# Patient Record
Sex: Female | Born: 1975 | Race: Black or African American | Hispanic: No | Smoking: Current every day smoker
Health system: Southern US, Community
[De-identification: ages and names within clinical notes are randomized; demographics above are authoritative.]

## PROBLEM LIST (undated history)

## (undated) DIAGNOSIS — F17200 Nicotine dependence, unspecified, uncomplicated: Secondary | ICD-10-CM

## (undated) DIAGNOSIS — I5022 Chronic systolic (congestive) heart failure: Secondary | ICD-10-CM

## (undated) DIAGNOSIS — B20 Human immunodeficiency virus [HIV] disease: Secondary | ICD-10-CM

## (undated) DIAGNOSIS — F319 Bipolar disorder, unspecified: Secondary | ICD-10-CM

## (undated) DIAGNOSIS — D219 Benign neoplasm of connective and other soft tissue, unspecified: Secondary | ICD-10-CM

## (undated) DIAGNOSIS — I10 Essential (primary) hypertension: Secondary | ICD-10-CM

## (undated) HISTORY — PX: OTHER SURGICAL HISTORY: SHX169

---

## 2004-12-11 DIAGNOSIS — I219 Acute myocardial infarction, unspecified: Secondary | ICD-10-CM

## 2004-12-11 DIAGNOSIS — I252 Old myocardial infarction: Secondary | ICD-10-CM

## 2004-12-11 HISTORY — DX: Acute myocardial infarction, unspecified: I21.9

## 2018-01-17 ENCOUNTER — Inpatient Hospital Stay: Admit: 2018-01-17 | Discharge: 2018-01-18 | Disposition: A | Discharge status: Home / Self Care | Payer: MEDICAID

## 2018-01-17 ENCOUNTER — Emergency Department: Admit: 2018-01-17 | Payer: MEDICAID

## 2018-01-17 DIAGNOSIS — I110 Hypertensive heart disease with heart failure: Principal | ICD-10-CM

## 2018-01-17 LAB — COMPREHENSIVE METABOLIC PANEL W/ REFLEX TO MG FOR LOW K
ALT: 9 U/L (ref 0–32)
AST: 14 U/L (ref 0–31)
Albumin: 3.5 g/dL (ref 3.5–5.2)
Alkaline Phosphatase: 39 U/L (ref 35–104)
Anion Gap: 3 mmol/L — ABNORMAL LOW (ref 7–16)
BUN: 14 mg/dL (ref 6–20)
CO2: 26 mmol/L (ref 22–29)
Calcium: 8.9 mg/dL (ref 8.6–10.2)
Chloride: 105 mmol/L (ref 98–107)
Creatinine: 1.1 mg/dL — ABNORMAL HIGH (ref 0.5–1.0)
GFR African American: >60
GFR Non-African American: >60 mL/min/{1.73_m2} (ref >=60)
Glucose: 97 mg/dL (ref 74–99)
Potassium reflex Magnesium: 4.6 mmol/L (ref 3.5–5.0)
Sodium: 134 mmol/L (ref 132–146)
Total Bilirubin: 0.3 mg/dL (ref 0.0–1.2)
Total Protein: 6.7 g/dL (ref 6.4–8.3)

## 2018-01-17 LAB — URINALYSIS
Bilirubin Urine: NEGATIVE
Glucose, Ur: NEGATIVE mg/dL
Ketones, Urine: NEGATIVE mg/dL
Nitrite, Urine: NEGATIVE
Protein, UA: 30 mg/dL — AB
Specific Gravity, UA: 1.02 (ref 1.005–1.030)
Urobilinogen, Urine: 0.2 E.U./dL (ref <2.0)
pH, UA: 7 (ref 5.0–9.0)

## 2018-01-17 LAB — POC PREGNANCY UR-QUAL
HCG, Urine, POC: NEGATIVE
Lot Number: 8080114

## 2018-01-17 LAB — MICROSCOPIC URINALYSIS

## 2018-01-17 LAB — TROPONIN: Troponin: <0.01 ng/mL (ref 0.00–0.03)

## 2018-01-17 NOTE — ED Notes (Signed)
FIRST PROVIDER CONTACT ASSESSMENT NOTE     Department of Emergency Medicine   01/17/18  4:15 PM    Chief Complaint: Hypertension (sent in from [facility] pt c/o HA 8/10)      History of Present Illness:   Julia Andrews is a 42 y.o. female who presents to the ED for hypertension and a headache. Patient has a history of CVA. Patient also states that she is currently seeking treatment any for cocaine use.    Medical History:  has no past medical history on file.  Surgical History:  has no past surgical history on file.  Social History:    Family History: family history is not on file.    *ALLERGIES*     Lisinopril; Remeron [mirtazapine]; and Abilify [aripiprazole]     Physical Exam:      VS:  BP (!) 151/105   Pulse 79   Temp 97.8 F (36.6 C)   Resp 16   Ht 5\' 4"  (1.626 m)   Wt 130 lb (59 kg)   SpO2 100%   BMI 22.31 kg/m      Initial Plan of Care:  Initiate Treatment-Testing, Proceed toTreatment Area When Bed Available for ED Attending/MLP to Continue Care    -----------------END OF FIRST PROVIDER CONTACT ASSESSMENT NOTE--------------  Electronically signed by Melody ComasMaggie K Conway, APRN - CNP   DD: 01/17/18           Melody ComasMaggie K Conway, APRN - CNP  01/17/18 1616

## 2018-01-17 NOTE — ED Provider Notes (Signed)
[facility]      Department of Emergency Medicine   ED  Provider Note  Admit Date/RoomTime: 01/17/2018  4:45 PM  ED Room: 46/INTW-1  MRN: [id number]  Chief Complaint: Hypertension (sent in from [facility] pt c/o HA 8/10)       History of Present Illness   Source of history provided by:  patient.  History/Exam Limitations: none.       Julia Andrews is a 42 y.o. female who has a past medical history of:   Past Medical History:   Diagnosis Date   . CHF (congestive heart failure) (HCC)    . HIV (human immunodeficiency virus infection) (HCC)    . Hypertension     presents to the ED by private car and is alone for high blood pressure reading at [facility]. She reports that she does have a long history of high blood pressure and has not been taking her medications. She denies any headache, denies any lightheadedness or dizziness, denies any denies any blurred vision. Denies any chest pain shortness of breath or difficulty breathing. Denies any abdominal pain, nausea, vomiting, diarrhea.,    ROS    Pertinent positives and negatives are stated within HPI, all other systems reviewed and are negative.    History reviewed. No pertinent surgical history.Social History:  reports that she has been smoking cigarettes.  She has been smoking about 0.25 packs per day. She has never used smokeless tobacco. She reports that she has current or past drug history. Drug: Cocaine. She reports that she does not drink alcohol.  Family History: family history is not on file.  Allergies: Lisinopril; Remeron [mirtazapine]; and Abilify [aripiprazole]    Physical Exam   Oxygen Saturation Interpretation: Normal.   ED Triage Vitals   BP Temp Temp Source Pulse Resp SpO2 Height Weight   01/17/18 1611 01/17/18 1611 01/17/18 2103 01/17/18 1611 01/17/18 1611 01/17/18 1611 01/17/18 1611 01/17/18 1611   (!) 151/105 97.8 F (36.6 C) Oral 79 16 100 % 5\' 4"  (1.626 m) 130 lb (59 kg)       Physical Exam   Constitutional/General: Alert and oriented x3,  well appearing, non toxic   HEENT:  NC/NT. PERRLA,  Airway patent.   Neck: Supple, full ROM, non tender to palpation in the midline, no stridor, no crepitus, no meningeal signs   Respiratory: Lungs clear to auscultation bilaterally, no wheezes, rales, or rhonchi. Not in respiratory distress   CV:  Regular rate. Regular rhythm. No murmurs, gallops, or rubs. 2+ distal pulses   Chest: No chest wall tenderness   GI:  Abdomen Soft, Non tender, Non distended.  +BS.   No rebound, guarding, or rigidity. No pulsatile masses.   Musculoskeletal: Moves all extremities x 4. Warm and well perfused, no clubbing, cyanosis, or edema. Capillary refill <3 seconds   Integument: skin warm and dry. No rashes.    Lymphatic: no lymphadenopathy noted   Neurologic: GCS 15, no focal deficits, symmetric strength 5/5 in the upper and lower extremities bilaterally   Psychiatric: Normal Affect    Lab / Imaging Results   (All laboratory and radiology results have been personally reviewed by myself)  Labs:  Results for orders placed or performed during the hospital encounter of 01/17/18   CBC Auto Differential   Result Value Ref Range    WBC 2.4 (L) 4.5 - 11.5 E9/L    RBC 3.62 3.50 - 5.50 E12/L    Hemoglobin 9.8 (L) 11.5 - 15.5 g/dL  Hematocrit 32.1 (L) 34.0 - 48.0 %    MCV 88.7 80.0 - 99.9 fL    MCH 27.1 26.0 - 35.0 pg    MCHC 30.5 (L) 32.0 - 34.5 %    RDW 14.6 11.5 - 15.0 fL    Platelets 219 130 - 450 E9/L    MPV 10.3 7.0 - 12.0 fL    Neutrophils % 45.1 43.0 - 80.0 %    Lymphocytes % 46.0 (H) 20.0 - 42.0 %    Monocytes % 8.0 2.0 - 12.0 %    Eosinophils % 0.9 0.0 - 6.0 %    Basophils % 0.8 0.0 - 2.0 %    Neutrophils # 1.08 (L) 1.80 - 7.30 E9/L    Lymphocytes # 1.10 (L) 1.50 - 4.00 E9/L    Monocytes # 0.19 0.10 - 0.95 E9/L    Eosinophils # 0.02 (L) 0.05 - 0.50 E9/L    Basophils # 0.00 0.00 - 0.20 E9/L    Anisocytosis 1+     Polychromasia 1+     Hypochromia 1+     Poikilocytes 1+     Ovalocytes 1+    Comprehensive Metabolic Panel w/  Reflex to MG   Result Value Ref Range    Sodium 134 132 - 146 mmol/L    Potassium reflex Magnesium 4.6 3.5 - 5.0 mmol/L    Chloride 105 98 - 107 mmol/L    CO2 26 22 - 29 mmol/L    Anion Gap 3 (L) 7 - 16 mmol/L    Glucose 97 74 - 99 mg/dL    BUN 14 6 - 20 mg/dL    CREATININE 1.1 (H) 0.5 - 1.0 mg/dL    GFR Non-African American >60 >=60 mL/min/1.73    GFR African American >60     Calcium 8.9 8.6 - 10.2 mg/dL    Total Protein 6.7 6.4 - 8.3 g/dL    Alb 3.5 3.5 - 5.2 g/dL    Total Bilirubin 0.3 0.0 - 1.2 mg/dL    Alkaline Phosphatase 39 35 - 104 U/L    ALT 9 0 - 32 U/L    AST 14 0 - 31 U/L   Troponin   Result Value Ref Range    Troponin <0.01 0.00 - 0.03 ng/mL   Urinalysis, reflex to microscopic   Result Value Ref Range    Color, UA RED (A) Straw/Yellow    Clarity, UA SL CLOUDY Clear    Glucose, Ur Negative Negative mg/dL    Bilirubin Urine Negative Negative    Ketones, Urine Negative Negative mg/dL    Specific Gravity, UA 1.020 1.005 - 1.030    Blood, Urine LARGE (A) Negative    pH, UA 7.0 5.0 - 9.0    Protein, UA 30 (A) Negative mg/dL    Urobilinogen, Urine 0.2 <2.0 E.U./dL    Nitrite, Urine Negative Negative    Leukocyte Esterase, Urine TRACE (A) Negative   Microscopic Urinalysis   Result Value Ref Range    WBC, UA 1-3 0 - 5 /HPF    RBC, UA 10-20 (A) 0 - 2 /HPF    Bacteria, UA RARE (A) /HPF   POC Pregnancy Urine   Result Value Ref Range    HCG, Urine, POC Negative Negative    Lot Number [id number]     Positive QC Pass/Fail Pass     Negative QC Pass/Fail Pass      Imaging:  All Radiology results interpreted by Radiologist unless otherwise noted.  CT Head  WO Contrast   Final Result      No acute findings.         XR CHEST STANDARD (2 VW)   Final Result   ALERT:  THIS IS AN ABNORMAL REPORT    1. Enlarged cardiomediastinal silhouette without the benefit of   comparison studies, the possibility of underlying pericardial effusion   or other cardiac abnormalities are not excluded on the basis of this   exam, clinical correlation  recommended.   2. Underlying mild pulmonary edema.   3. Indeterminate bibasilar opacifications are likely reflective of   atelectasis versus early developing pneumonia.   4. Suspected skinfold overlying the right mid and lower lung, if there   is clinical concern for potential pneumothorax, a repeat chest   radiograph with careful attention by the x-ray technician to this   region to exclude any potential of skin fold would be recommended.          ED Course / Medical Decision Making     Medications   furosemide (LASIX) tablet 20 mg (20 mg Oral Given 01/17/18 1920)   cloNIDine (CATAPRES) tablet 0.1 mg (0.1 mg Oral Given 01/17/18 1920)            Consult(s):   None    Procedure(s):   none    MDM:   Patient's blood pressure was able to be controlled in the emergency department she'll be discharged instructed to follow up by taking her prescribed medications, she was found to have mild congestive heart failure on imaging however she's having no shortness breath difficulty breathing and and only trace edema. She E expresses to me chronic vaginal bleeding throughout the month, her blood counts and her vital signs were normal in the emergency department I will defer to follow-up with GYN this week    Counseling:    The emergency provider has spoken with the patient and discussed today's results, in addition to providing specific details for the plan of care and counseling regarding the diagnosis and prognosis.  Questions are answered at this time and they are agreeable with the plan.    Assessment      1. Hypertension, unspecified type    2. Vagina bleeding    3. Congestive heart failure, unspecified HF chronicity, unspecified heart failure type (HCC)      home  Discharge to home  Patient condition is good    New Medications   There are no discharge medications for this patient.    Electronically signed by Eunice Blase, APRN - CNP   DD: 03/05/18  **This report was transcribed using voice recognition software. Every effort was  made to ensure accuracy; however, inadvertent computerized transcription errors may be present.  END OF ED PROVIDER NOTE        Eunice Blase, APRN - CNP  03/05/18 0007

## 2018-01-18 LAB — CBC WITH AUTO DIFFERENTIAL
Basophils %: 0.8 % (ref 0.0–2.0)
Basophils Absolute: 0 E9/L (ref 0.00–0.20)
Eosinophils %: 0.9 % (ref 0.0–6.0)
Eosinophils Absolute: 0.02 E9/L — ABNORMAL LOW (ref 0.05–0.50)
Hematocrit: 32.1 % — ABNORMAL LOW (ref 34.0–48.0)
Hemoglobin: 9.8 g/dL — ABNORMAL LOW (ref 11.5–15.5)
Lymphocytes %: 46 % — ABNORMAL HIGH (ref 20.0–42.0)
Lymphocytes Absolute: 1.1 E9/L — ABNORMAL LOW (ref 1.50–4.00)
MCH: 27.1 pg (ref 26.0–35.0)
MCHC: 30.5 % — ABNORMAL LOW (ref 32.0–34.5)
MCV: 88.7 fL (ref 80.0–99.9)
MPV: 10.3 fL (ref 7.0–12.0)
Monocytes %: 8 % (ref 2.0–12.0)
Monocytes Absolute: 0.19 E9/L (ref 0.10–0.95)
Neutrophils %: 45.1 % (ref 43.0–80.0)
Neutrophils Absolute: 1.08 E9/L — ABNORMAL LOW (ref 1.80–7.30)
Platelets: 219 E9/L (ref 130–450)
RBC: 3.62 E12/L (ref 3.50–5.50)
RDW: 14.6 fL (ref 11.5–15.0)
WBC: 2.4 E9/L — ABNORMAL LOW (ref 4.5–11.5)

## 2018-01-18 MED ORDER — CLONIDINE HCL 0.1 MG PO TABS
0.1 MG | Freq: Once | ORAL | Status: AC
Start: 2018-01-18 — End: 2018-01-17
  Administered 2018-01-18: 0.1 mg via ORAL

## 2018-01-18 MED ORDER — KETOROLAC TROMETHAMINE 30 MG/ML IJ SOLN
30 | Freq: Four times a day (QID) | INTRAMUSCULAR | Status: DC | PRN
Start: 2018-01-18 — End: 2018-01-17
  Administered 2018-01-18: 15 mg via INTRAVENOUS

## 2018-01-18 MED ORDER — FUROSEMIDE 20 MG PO TABS
20 MG | Freq: Once | ORAL | Status: AC
Start: 2018-01-18 — End: 2018-01-17
  Administered 2018-01-18: 20 mg via ORAL

## 2018-01-18 MED FILL — CLONIDINE HCL 0.1 MG PO TABS: 0.1 mg | ORAL | Qty: 1

## 2018-01-18 MED FILL — KETOROLAC TROMETHAMINE 30 MG/ML IJ SOLN: 30 mg/mL | INTRAMUSCULAR | Qty: 1

## 2018-01-18 MED FILL — FUROSEMIDE 20 MG PO TABS: 20 mg | ORAL | Qty: 1

## 2020-03-15 ENCOUNTER — Other Ambulatory Visit: Payer: Self-pay

## 2020-03-15 ENCOUNTER — Observation Stay (HOSPITAL_COMMUNITY)
Admission: EM | Admit: 2020-03-15 | Discharge: 2020-03-17 | Disposition: A | Discharge status: Home / Self Care | Payer: Medicaid - Out of State | Attending: Internal Medicine | Admitting: Internal Medicine

## 2020-03-15 ENCOUNTER — Observation Stay (HOSPITAL_BASED_OUTPATIENT_CLINIC_OR_DEPARTMENT_OTHER): Payer: Medicaid - Out of State

## 2020-03-15 ENCOUNTER — Emergency Department (HOSPITAL_COMMUNITY): Payer: Medicaid - Out of State

## 2020-03-15 ENCOUNTER — Encounter (HOSPITAL_COMMUNITY): Payer: Self-pay | Admitting: Emergency Medicine

## 2020-03-15 DIAGNOSIS — I5022 Chronic systolic (congestive) heart failure: Secondary | ICD-10-CM | POA: Diagnosis present

## 2020-03-15 DIAGNOSIS — B20 Human immunodeficiency virus [HIV] disease: Secondary | ICD-10-CM

## 2020-03-15 DIAGNOSIS — Z79899 Other long term (current) drug therapy: Secondary | ICD-10-CM | POA: Diagnosis not present

## 2020-03-15 DIAGNOSIS — F319 Bipolar disorder, unspecified: Secondary | ICD-10-CM

## 2020-03-15 DIAGNOSIS — R0789 Other chest pain: Principal | ICD-10-CM | POA: Insufficient documentation

## 2020-03-15 DIAGNOSIS — Z21 Asymptomatic human immunodeficiency virus [HIV] infection status: Secondary | ICD-10-CM | POA: Diagnosis not present

## 2020-03-15 DIAGNOSIS — Z915 Personal history of self-harm: Secondary | ICD-10-CM | POA: Diagnosis not present

## 2020-03-15 DIAGNOSIS — R079 Chest pain, unspecified: Secondary | ICD-10-CM | POA: Diagnosis present

## 2020-03-15 DIAGNOSIS — I110 Hypertensive heart disease with heart failure: Secondary | ICD-10-CM | POA: Insufficient documentation

## 2020-03-15 DIAGNOSIS — F3111 Bipolar disorder, current episode manic without psychotic features, mild: Secondary | ICD-10-CM

## 2020-03-15 DIAGNOSIS — I169 Hypertensive crisis, unspecified: Secondary | ICD-10-CM | POA: Diagnosis present

## 2020-03-15 DIAGNOSIS — F17210 Nicotine dependence, cigarettes, uncomplicated: Secondary | ICD-10-CM | POA: Insufficient documentation

## 2020-03-15 DIAGNOSIS — I2510 Atherosclerotic heart disease of native coronary artery without angina pectoris: Secondary | ICD-10-CM | POA: Diagnosis not present

## 2020-03-15 DIAGNOSIS — R072 Precordial pain: Secondary | ICD-10-CM

## 2020-03-15 DIAGNOSIS — Z87892 Personal history of anaphylaxis: Secondary | ICD-10-CM | POA: Diagnosis not present

## 2020-03-15 DIAGNOSIS — F1594 Other stimulant use, unspecified with stimulant-induced mood disorder: Secondary | ICD-10-CM

## 2020-03-15 DIAGNOSIS — Z9114 Patient's other noncompliance with medication regimen: Secondary | ICD-10-CM | POA: Diagnosis not present

## 2020-03-15 DIAGNOSIS — Z20822 Contact with and (suspected) exposure to covid-19: Secondary | ICD-10-CM | POA: Diagnosis not present

## 2020-03-15 DIAGNOSIS — D219 Benign neoplasm of connective and other soft tissue, unspecified: Secondary | ICD-10-CM | POA: Diagnosis present

## 2020-03-15 DIAGNOSIS — F419 Anxiety disorder, unspecified: Secondary | ICD-10-CM | POA: Diagnosis not present

## 2020-03-15 DIAGNOSIS — D61818 Other pancytopenia: Secondary | ICD-10-CM | POA: Diagnosis present

## 2020-03-15 DIAGNOSIS — Z590 Homelessness: Secondary | ICD-10-CM | POA: Insufficient documentation

## 2020-03-15 DIAGNOSIS — Z888 Allergy status to other drugs, medicaments and biological substances status: Secondary | ICD-10-CM | POA: Diagnosis not present

## 2020-03-15 DIAGNOSIS — F1990 Other psychoactive substance use, unspecified, uncomplicated: Secondary | ICD-10-CM

## 2020-03-15 DIAGNOSIS — F1910 Other psychoactive substance abuse, uncomplicated: Secondary | ICD-10-CM | POA: Diagnosis not present

## 2020-03-15 DIAGNOSIS — F17200 Nicotine dependence, unspecified, uncomplicated: Secondary | ICD-10-CM | POA: Diagnosis present

## 2020-03-15 DIAGNOSIS — R778 Other specified abnormalities of plasma proteins: Secondary | ICD-10-CM

## 2020-03-15 DIAGNOSIS — I252 Old myocardial infarction: Secondary | ICD-10-CM | POA: Diagnosis not present

## 2020-03-15 DIAGNOSIS — I10 Essential (primary) hypertension: Secondary | ICD-10-CM

## 2020-03-15 DIAGNOSIS — Z9119 Patient's noncompliance with other medical treatment and regimen: Secondary | ICD-10-CM | POA: Insufficient documentation

## 2020-03-15 DIAGNOSIS — R7989 Other specified abnormal findings of blood chemistry: Secondary | ICD-10-CM

## 2020-03-15 HISTORY — DX: Nicotine dependence, unspecified, uncomplicated: F17.200

## 2020-03-15 HISTORY — DX: Benign neoplasm of connective and other soft tissue, unspecified: D21.9

## 2020-03-15 HISTORY — DX: Bipolar disorder, unspecified: F31.9

## 2020-03-15 HISTORY — DX: Human immunodeficiency virus (HIV) disease: B20

## 2020-03-15 HISTORY — DX: Chronic systolic (congestive) heart failure: I50.22

## 2020-03-15 HISTORY — DX: Essential (primary) hypertension: I10

## 2020-03-15 LAB — COMPREHENSIVE METABOLIC PANEL
ALT: 24 U/L (ref 0–44)
AST: 42 U/L — ABNORMAL HIGH (ref 15–41)
Albumin: 3.8 g/dL (ref 3.5–5.0)
Alkaline Phosphatase: 44 U/L (ref 38–126)
Anion gap: 14 (ref 5–15)
BUN: 19 mg/dL (ref 6–20)
CO2: 21 mmol/L — ABNORMAL LOW (ref 22–32)
Calcium: 9.7 mg/dL (ref 8.9–10.3)
Chloride: 104 mmol/L (ref 98–111)
Creatinine, Ser: 1.03 mg/dL — ABNORMAL HIGH (ref 0.44–1.00)
GFR calc Af Amer: >60 mL/min (ref >60)
GFR calc non Af Amer: >60 mL/min (ref >60)
Glucose, Bld: 102 mg/dL — ABNORMAL HIGH (ref 70–99)
Potassium: 4.1 mmol/L (ref 3.5–5.1)
Sodium: 139 mmol/L (ref 135–145)
Total Bilirubin: 0.5 mg/dL (ref 0.3–1.2)
Total Protein: 7.6 g/dL (ref 6.5–8.1)

## 2020-03-15 LAB — RAPID URINE DRUG SCREEN, HOSP PERFORMED
Amphetamines: POSITIVE — AB
Barbiturates: NOT DETECTED
Benzodiazepines: NOT DETECTED
Cocaine: NOT DETECTED
Opiates: NOT DETECTED
Tetrahydrocannabinol: POSITIVE — AB

## 2020-03-15 LAB — CBC WITH DIFFERENTIAL/PLATELET
Abs Immature Granulocytes: 0.01 10*3/uL (ref 0.00–0.07)
Basophils Absolute: 0 10*3/uL (ref 0.0–0.1)
Basophils Relative: 0 %
Eosinophils Absolute: 0 10*3/uL (ref 0.0–0.5)
Eosinophils Relative: 1 %
HCT: 35.8 % — ABNORMAL LOW (ref 36.0–46.0)
Hemoglobin: 11.3 g/dL — ABNORMAL LOW (ref 12.0–15.0)
Immature Granulocytes: 0 %
Lymphocytes Relative: 16 %
Lymphs Abs: 0.5 10*3/uL — ABNORMAL LOW (ref 0.7–4.0)
MCH: 28.4 pg (ref 26.0–34.0)
MCHC: 31.6 g/dL (ref 30.0–36.0)
MCV: 89.9 fL (ref 80.0–100.0)
Monocytes Absolute: 0.4 10*3/uL (ref 0.1–1.0)
Monocytes Relative: 13 %
Neutro Abs: 2.1 10*3/uL (ref 1.7–7.7)
Neutrophils Relative %: 70 %
Platelets: 138 10*3/uL — ABNORMAL LOW (ref 150–400)
RBC: 3.98 MIL/uL (ref 3.87–5.11)
RDW: 15.3 % (ref 11.5–15.5)
WBC: 3.1 10*3/uL — ABNORMAL LOW (ref 4.0–10.5)
nRBC: 0 % (ref 0.0–0.2)

## 2020-03-15 LAB — T-HELPER CELLS (CD4) COUNT (NOT AT ARMC)
CD4 % Helper T Cell: 13 % — ABNORMAL LOW (ref 33–65)
CD4 T Cell Abs: 41 /uL — ABNORMAL LOW (ref 400–1790)

## 2020-03-15 LAB — LIPID PANEL
Cholesterol: 169 mg/dL (ref 0–200)
HDL: 46 mg/dL (ref >40)
LDL Cholesterol: 114 mg/dL — ABNORMAL HIGH (ref 0–99)
Total CHOL/HDL Ratio: 3.7 RATIO
Triglycerides: 47 mg/dL (ref <150)
VLDL: 9 mg/dL (ref 0–40)

## 2020-03-15 LAB — ECHOCARDIOGRAM COMPLETE
Height: 64 in
Weight: 2080 oz

## 2020-03-15 LAB — TSH: TSH: 0.921 u[IU]/mL (ref 0.350–4.500)

## 2020-03-15 LAB — TROPONIN I (HIGH SENSITIVITY)
Troponin I (High Sensitivity): 51 ng/L — ABNORMAL HIGH (ref <18)
Troponin I (High Sensitivity): 34 ng/L — ABNORMAL HIGH (ref <18)
Troponin I (High Sensitivity): 30 ng/L — ABNORMAL HIGH (ref <18)

## 2020-03-15 LAB — ETHANOL: Alcohol, Ethyl (B): <10 mg/dL (ref <10)

## 2020-03-15 LAB — SARS CORONAVIRUS 2 (TAT 6-24 HRS): SARS Coronavirus 2: NEGATIVE

## 2020-03-15 MED ORDER — ONDANSETRON HCL 4 MG PO TABS: 4.0000 mg | ORAL_TABLET | Freq: Four times a day (QID) | ORAL | Status: DC | PRN

## 2020-03-15 MED ORDER — AMLODIPINE BESYLATE 5 MG PO TABS
5.0000 mg | ORAL_TABLET | Freq: Every day | ORAL | Status: DC
  Administered 2020-03-15 – 2020-03-16 (×2): 5 mg via ORAL
  Filled 2020-03-15 (×2): qty 1

## 2020-03-15 MED ORDER — ACETAMINOPHEN 650 MG RE SUPP: 650.0000 mg | Freq: Four times a day (QID) | RECTAL | Status: DC | PRN

## 2020-03-15 MED ORDER — ASPIRIN 81 MG PO CHEW
324.0000 mg | CHEWABLE_TABLET | Freq: Once | ORAL | Status: AC
  Administered 2020-03-15: 06:00:00 324 mg via ORAL
  Filled 2020-03-15: qty 4

## 2020-03-15 MED ORDER — ZOLPIDEM TARTRATE 5 MG PO TABS: 5.0000 mg | ORAL_TABLET | Freq: Every evening | ORAL | Status: DC | PRN

## 2020-03-15 MED ORDER — ACETAMINOPHEN 325 MG PO TABS: 650.0000 mg | ORAL_TABLET | Freq: Four times a day (QID) | ORAL | Status: DC | PRN

## 2020-03-15 MED ORDER — ENOXAPARIN SODIUM 40 MG/0.4ML ~~LOC~~ SOLN
40.0000 mg | SUBCUTANEOUS | Status: DC
  Administered 2020-03-15 – 2020-03-16 (×2): 40 mg via SUBCUTANEOUS
  Filled 2020-03-15 (×2): qty 0.4

## 2020-03-15 MED ORDER — POLYETHYLENE GLYCOL 3350 17 G PO PACK
17.0000 g | PACK | Freq: Every day | ORAL | Status: DC | PRN
  Administered 2020-03-15: 15:00:00 17 g via ORAL
  Filled 2020-03-15: qty 1

## 2020-03-15 MED ORDER — ASPIRIN EC 81 MG PO TBEC
81.0000 mg | DELAYED_RELEASE_TABLET | Freq: Every day | ORAL | Status: DC
  Administered 2020-03-16 – 2020-03-17 (×2): 81 mg via ORAL
  Filled 2020-03-15 (×2): qty 1

## 2020-03-15 MED ORDER — HYDRALAZINE HCL 20 MG/ML IJ SOLN
5.0000 mg | INTRAMUSCULAR | Status: DC | PRN
  Administered 2020-03-16 (×2): 5 mg via INTRAVENOUS
  Filled 2020-03-15: qty 1

## 2020-03-15 MED ORDER — NICOTINE 14 MG/24HR TD PT24
14.0000 mg | MEDICATED_PATCH | Freq: Every day | TRANSDERMAL | Status: DC
  Filled 2020-03-15 (×2): qty 1

## 2020-03-15 MED ORDER — ONDANSETRON HCL 4 MG/2ML IJ SOLN: 4.0000 mg | Freq: Four times a day (QID) | INTRAMUSCULAR | Status: DC | PRN

## 2020-03-15 MED ORDER — DOCUSATE SODIUM 100 MG PO CAPS
100.0000 mg | ORAL_CAPSULE | Freq: Two times a day (BID) | ORAL | Status: DC
  Administered 2020-03-15 – 2020-03-17 (×5): 100 mg via ORAL
  Filled 2020-03-15 (×6): qty 1

## 2020-03-15 MED ORDER — BISACODYL 5 MG PO TBEC: 5.0000 mg | DELAYED_RELEASE_TABLET | Freq: Every day | ORAL | Status: DC | PRN

## 2020-03-15 NOTE — Consult Note (Addendum)
Cardiology Consultation:   Patient ID: Quinlan Boom; UB:[id number]; 11/18/1976   Admit date: 03/15/2020 Date of Consult: 03/15/2020  Primary Care Provider: Patient, No Pcp Per Primary Cardiologist: New to [facility]   Patient Profile:   Melyssa Tetreault is a 44 y.o. 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 Lorin Mercy.  History of Present Illness:   Ms. Kovacevich is a 44yo F with a hx as stated above who presented to [facility] 03/15/20 with chest pain. Pt has a complicated history including homelessness and mental instability who reports that she is from [facility] and lost her husband last year. Since that time she [place] for the better. She has a complicated hx of depression with muliple suicide attempts, drug abuse with hx of incarceration and mental disorders making it difficult for her. She was previously staying at a homeless camp in [facility] when she decided to up and leave. She left all of her belongings at the camp. She made it several cities over and received help from a Engineer, structural who bought her train ticket to [facility]. She reports that after her arrival, she began having a severe headache. She was having difficulty finding the hospital. After 3 hours of walking she began having chest pain. She denies SOB, diaphoresis or nausea. She does report some pain radiation to her left upper shoulder. She has never experienced this pain in the past. She states that her MI was back in 2006 after using cocaine. 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 She was previously followed by a family practitioner however per notes and patient report, she has refused most therapies. She has trouble affording her medications and also wishes to  eventually become pregnant and has been counseled on the side effects of ACEI, ARB therapies therefore has denied. She denies recent drug use when directly asked today. Reports the only medications that she has been taking are her Lasix and Seroquel but unfortunately she left these in [facility].   On EMS arrival, she was given ASAx4 per EMS as well as 1 SL NTG with pain reduction. BP on their evaluation was elevated at 185/119. She is unable to provide specific information regarding her prior MI, HF or PTA medications.   In the ED, HST found to be 51 with repeat of 31. EKG with NSR, HR 81bpm with evidence of anterior wall MI, LVH and TWI in lateral leads. There is no EKG available for comparison and no acute ST segment changes. CXR unremarkable. COVID test in process. UDS positive for amphetamines and THC.    Cardiology has been asked to evaluate for chest pain.   Per Care Everywhere she has been followed by a family MD, last seen . She had an echocardiogram performed 01/31/2013 with an EF at 40-45% felt to be secondary to crack/cocaine abuse. Underwent exercise nuclear stress test secondary to CP 01/2013 which was found to be negative with no evidence of ischemia or prior MI.   She was prescribed hydralazine 25mg  daily as she has had compliance issues in the past and this was the only medication that she agreed to take. She is allergic to lisinopril and cannot be prescribed BB given her significant hx of  cocaine use. She was not agreeable to taking HCTZ. Her PCP prescribed nifedipine 60mg  PO QD. She is currently not taking any of these medications.   Past Medical History:  Diagnosis Date   Bipolar affective ([place])    Chronic systolic CHF (congestive heart failure) (HCC)    HIV (human immunodeficiency virus infection) ([place])    Hypertension    Myocardial infarct ([place]) 2006    Past Surgical History:  Procedure Laterality Date   none       Prior to Admission medications   Medication Sig Start Date End  Date Taking? Authorizing Provider  bictegravir-emtricitabine-tenofovir AF (BIKTARVY) 50-200-25 MG TABS tablet Take 1 tablet by mouth daily. 02/25/19   [provider]  furosemide (LASIX) 40 MG tablet Take 40 mg by mouth daily. 02/23/20   [provider]  QUEtiapine (SEROQUEL) 25 MG tablet Take 25 mg by mouth 2 (two) times daily. 11/24/19   [provider]    Inpatient Medications: Scheduled Meds:  Continuous Infusions:  PRN Meds:   Allergies:    Allergies  Allergen Reactions   Lisinopril Anaphylaxis   Remeron [Mirtazapine] Shortness Of Breath   Abilify [Aripiprazole] Rash    Social History:   Social History   Socioeconomic History   Marital status: Single    Spouse name: Not on file   Number of children: Not on file   Years of education: Not on file   Highest education level: Not on file  Occupational History   Occupation: unemployed  Tobacco Use   Smoking status: Current Every Day Smoker    Packs/day: 0.50    Types: Cigarettes   Smokeless tobacco: Never Used  Substance and Sexual Activity   Alcohol use: Not Currently   Drug use: Yes    Frequency: 1.0 times per week    Types: Marijuana, Amphetamines   Sexual activity: Not Currently  Other Topics Concern   Not on file  Social History Narrative   Not on file   Social Determinants of Health   Financial Resource Strain:    Difficulty of Paying Living Expenses:   Food Insecurity:    Worried About Charity fundraiser in the Last Year:    Arboriculturist in the Last Year:   Transportation Needs:    Film/video editor (Medical):    Lack of Transportation (Non-Medical):   Physical Activity:    Days of Exercise per Week:    Minutes of Exercise per Session:   Stress:    Feeling of Stress :   Social Connections:    Frequency of Communication with Friends and Family:    Frequency of Social Gatherings with Friends and Family:    Attends Religious Services:    Active Member of Clubs or  Organizations:    Attends Music therapist:    Marital Status:   Intimate Partner Violence:    Fear of Current or Ex-Partner:    Emotionally Abused:    Physically Abused:    Sexually Abused:     Family History:   History reviewed. No pertinent family history. Family Status:  No family status information on file.    ROS:  Please see the history of present illness.  All other ROS reviewed and negative.     Physical Exam/Data:   Vitals:   03/15/20 0430 03/15/20 0600 03/15/20 0700 03/15/20 0800  BP: (!) 160/122 (!) 145/94 137/87 (!) 150/99  Pulse: 82  76 73  Resp: 15 19 16 15   Temp:  TempSrc:      SpO2: 100%  99% 100%  Weight:      Height:       No intake or output data in the 24 hours ending 03/15/20 0956 Filed Weights   03/15/20 0421  Weight: 59 kg   Body mass index is 22.31 kg/m.   General: Well developed, well nourished, NAD Skin: Warm, dry, intact  Neck: Negative for carotid bruits. No JVD Lungs:Clear to ausculation bilaterally. No wheezes, rales, or rhonchi. Breathing is unlabored. Cardiovascular: RRR with S1 S2. No murmurs.  Abdomen: Soft, non-tender, non-distended. No obvious abdominal masses. Extremities: No edema. Radial pulses 2+ bilaterally Neuro: Alert and oriented. No focal deficits. No facial asymmetry. MAE spontaneously. Psych: Responds to questions appropriately with normal affect.     EKG:  The EKG was personally reviewed and demonstrates: 03/15/20 NSR with HR 81bpm, anterior Q waves and TWI in lateral leads with no acute ST segment changes. There is no other EKG for comparison  Telemetry:  Telemetry was personally reviewed and demonstrates: 03/15/20 NSR with rates in the 70-80's   Relevant CV Studies:  Transthoracic Echocardiogram 01/31/13  Conclusions 1. The left ventricular chamber size is normal with mild to moderate global systolic dysfunction, EF [id number]. 2. The right ventricular chamber size and systolic function is normal. 3.  There is no hemodynamically significant valvular disease.  NUC MED STRESS 01/31/13 PERFUSION SCAN IMPRESSION:  1. Exercise stress myocardial perfusion scan within normal limits. 2. No evidence of ischemia. 3. No evidence of prior myocardial injury. 4. Normal left ventricular systolic function with LVEF equal to 61%.  ECHO 06/17/18 Conclusions:  1. Mild to moderate concentric left ventricular hypertrophy is observed suggestive of hypertensive heart disease. 2. There is mild to moderately decreased left ventricular systolic function.  3. The LV Ejection Fraction is 40%. Wall motion abnormalities suspicious for ischemic cardiomyopathy. 4. No significant valvular abnormalities. 5. Compared to prior study performed [id number], LV systolic dysfunction and nonspecific regional wall motion abnormalities are unchanged.  6. Trace pericardial effusion is a new finding.  Laboratory Data:  Chemistry Recent Labs  Lab 03/15/20 0440  NA 139  K 4.1  CL 104  CO2 21*  GLUCOSE 102*  BUN 19  CREATININE 1.03*  CALCIUM 9.7  GFRNONAA >60  GFRAA >60  ANIONGAP 14    Total Protein  Date Value Ref Range Status  03/15/2020 7.6 6.5 - 8.1 g/dL Final   Albumin  Date Value Ref Range Status  03/15/2020 3.8 3.5 - 5.0 g/dL Final   AST  Date Value Ref Range Status  03/15/2020 42 (H) 15 - 41 U/L Final   ALT  Date Value Ref Range Status  03/15/2020 24 0 - 44 U/L Final   Alkaline Phosphatase  Date Value Ref Range Status  03/15/2020 44 38 - 126 U/L Final   Total Bilirubin  Date Value Ref Range Status  03/15/2020 0.5 0.3 - 1.2 mg/dL Final   Hematology Recent Labs  Lab 03/15/20 0439  WBC 3.1*  RBC 3.98  HGB 11.3*  HCT 35.8*  MCV 89.9  MCH 28.4  MCHC 31.6  RDW 15.3  PLT 138*   Cardiac EnzymesNo results for input(s): TROPONINI in the last 168 hours. No results for input(s): TROPIPOC in the last 168 hours.  BNPNo results for input(s): BNP, PROBNP in the last 168 hours.  DDimer No results  for input(s): DDIMER in the last 168 hours. TSH: No results found for: TSH Lipids:No results found for: CHOL, HDL, LDLCALC,  LDLDIRECT, TRIG, CHOLHDL HgbA1c:No results found for: HGBA1C  Radiology/Studies:  DG Chest 2 View  Result Date: 03/15/2020 CLINICAL DATA:  Chest pain and lethargy. EXAM: CHEST - 2 VIEW COMPARISON:  None. FINDINGS: The heart size and mediastinal contours are within normal limits. Both lungs are clear. The visualized skeletal structures are unremarkable. IMPRESSION: Normal chest x-ray. Electronically Signed   By: Marijo Sanes M.D.   On: 03/15/2020 05:25   Assessment and Plan:   1. Chest Pain with hx of MI: -Pt presented to [facility] 4[facility]2 after her arrival in [place] from [facility] by train with a severe headache. She was attempting to find a hospital (no phone or other belongings). After 3 hours of walking she began having chest pain with no SOB, diaphoresis or nausea. She does report some pain radiation to her left upper shoulder. 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 She has denied HF therapies in the past including ACEI (allergic) and ARB. -Has hx of drug use with UDS positive for amphetamines this admission however denies recent drug use. -Given ASAx4 per EMS as well as 1 SL NTG with pain reduction.  -BP on their evaluation was elevated at 185/119. -HST found to be 51 with repeat of 31 not consistent with ACS -EKG with NSR, HR 81bpm with evidence of anterior wall MI, LVH and TWI in lateral leads. There is no EKG available for comparison and no acute ST segment changes.  -CXR unremarkable. COVID test in process. -Would recommend at least repeating echocardiogram for comparison however do not suspect that her CP is secondary to acute coronary abnormality. Likely chest pain is either secondary HTN or substance abuse. If echocardiogram with WMA or significant drop in EF, then can  consider Lexiscan stress test versus cath but compliance will certainly be an issue moving forward.   2. Essential HTN: -BP on EMS arrival elevated at 185/119>>>improved now 150/99>137/87>145/94 -On home Lasix but no antihypertesive therapies per PTA review  -Has allergy to lisinopril, anaphylaxis  -No BB secondary to hx of cocaine abuse -Per care everywhere note, would consider QD dosing given hx -Start amlodipine for greater BP control   3. 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 HIV: -Previously form [place] and reports not taking her HIV medications for several months due to s/e -Management per primary team   5. Bipolar disorder: -Previously stopped therapy with Seroquel due to loss of her husband  -Management per primary team   6. Tobacco and polysubstance abuse: -Reports current everyday tobacco use -UDS on admission positive for amphetamines and THC -Denies recent substance abuse   7. Homelessness: -Reports previously living in homeless camp in [facility] -Will need care management consult for OP resources.    For questions or updates, please contact [place] Please consult [email or web address] for contact info under Cardiology/STEMI.   Lyndel Safe NP-C [facility] Pager: [phone] 03/15/2020 9:56 AM  Patient examined chart reviewed Discussed care with patient and NP.  Exam with thin black female Coughing. Mild rhonchi no murmur abdomen benign no  Edema. ECG with non specific ST changes minimal elevation in troponin with no trend. Doubt acute cardiac event. Start norvasc for HTN. ? Anaphylaxis with ACE In past and history of cocaine so  avoid beta blocker. Drug screen positive for amphetamine and THC.  Will order echo today if EF normal and no RWMA;s no further w/u. If EF down as reported  historically from cocaine induced MI will need lexiscan myovue in am for scar burden, EF and r/o ischemia CXR ok despite abnormal lung exam and cough Compliance with meds and homelessness huge issues   Jenkins Rouge MD [facility]  \

## 2020-03-15 NOTE — Progress Notes (Signed)
Patient ID: Jane Estrada, female   DOB: 11-16-1976, 43 y.o.   MRN: [id number]         [facility]    Date of Admission:  03/15/2020          Reason for Consult: HIV infection    Referring Provider: Dr. Karmen Bongo  Assessment: Jane Estrada has many obstacles that make adherence with antiretroviral therapy and follow-up visits quite problematic including untreated bipolar disorder, polysubstance use and fairly fixed beliefs that prescription medication makes her feel worse and might trigger another suicide attempt.  I asked her to consider whether or not she would be willing to try a newer combination pill that causes no side effects in most people who take it.  I will return to talk to her more about this tomorrow.  Plan: 1. Await results of CD4 and HIV viral load 2. Agree with psychiatry evaluation  Principal Problem:   HIV (human immunodeficiency virus infection) ([place]) Active Problems:   Chest pain   Tobacco dependence   History of myocardial infarction   Hypertension   Granular cell tumor   Chronic systolic CHF (congestive heart failure) (HCC)   Bipolar affective (HCC)   Polysubstance abuse (HCC)   Pancytopenia (HCC)   Scheduled Meds: . amLODipine  5 mg Oral Daily  . [START ON 03/16/2020] aspirin EC  81 mg Oral Daily  . docusate sodium  100 mg Oral BID  . enoxaparin (LOVENOX) injection  40 mg Subcutaneous Q24H  . nicotine  14 mg Transdermal Daily   Continuous Infusions: PRN Meds:.acetaminophen **OR** acetaminophen, bisacodyl, hydrALAZINE, ondansetron **OR** ondansetron (ZOFRAN) IV, polyethylene glycol, zolpidem  HPI: Jane Estrada is a 44 y.o. female who was diagnosed with HIV infection in 2005.  She entered into care at [facility] in [place].  Her CD4 count at the time of diagnosis was 165 and her viral load was 77,000.  Records in care everywhere show that she has had intermittent periods of viral suppression since her diagnosis.   Her CD4 count reconstituted to normal quickly after first starting therapy.  She has been on a variety of regimens.  A genotype in 2014 showed that predominant virus had NNRTI resistance.  Records indicate that viral control has been very poor since 2015.  She has a history of polysubstance abuse and bipolar disorder.  She has a history of suicide attempts.  When she was last seen in [place] in March of last year her CD4 count had fallen to a nadir of 146.  Her HIV provider felt that that sudden decline from 359 a few weeks earlier was due to acute leukopenia.  He chose not to start pneumocystis prophylaxis.  Her viral load at that time was 5751.  He recommended restarting her Triumeq but she never picked up her medication.  I cannot find any evidence that she is ever had an HIV related opportunistic infection.  Her husband died last year.  She states that she promised her husband she would never try to kill herself again.  She tells me that she did not take Triumeq because it made her feel bad.  She says that she has also "overdosed on every medication she has ever been prescribed" so she does not want to take prescription medications.  She says she recently got a message from God telling her that she needed to move.  She ended up in [place] recently and began having chest pain which brought her to the emergency department last  night.  She denies any recent fever.  She tells me that she is not sure if she is going to stay in [place].  Review of Systems: Review of Systems  Constitutional: Negative for chills, diaphoresis, fever, malaise/fatigue and weight loss.  Respiratory: Positive for cough. Negative for sputum production and shortness of breath.   Cardiovascular: Positive for chest pain.  Gastrointestinal: Negative for abdominal pain, diarrhea, nausea and vomiting.  Genitourinary: Negative for dysuria.  Musculoskeletal: Positive for back pain. Negative for joint pain and myalgias.  Skin: Negative  for rash.  Neurological: Negative for headaches.  Psychiatric/Behavioral: Positive for depression and substance abuse. Negative for suicidal ideas.    Past Medical History:  Diagnosis Date  . Bipolar affective ([place])   . Chronic systolic CHF (congestive heart failure) ([place])   . Granular cell tumor   . HIV (human immunodeficiency virus infection) ([place])   . Hypertension   . Myocardial infarct ([place]) 2006  . Tobacco dependence     Social History   Tobacco Use  . Smoking status: Current Every Day Smoker    Packs/day: 0.50    Types: Cigarettes  . Smokeless tobacco: Never Used  Substance Use Topics  . Alcohol use: Not Currently  . Drug use: Yes    Frequency: 1.0 times per week    Types: Marijuana, Amphetamines    History reviewed. No pertinent family history. Allergies  Allergen Reactions  . Lisinopril Anaphylaxis  . Remeron [Mirtazapine] Shortness Of Breath  . Abilify [Aripiprazole] Rash    OBJECTIVE: Blood pressure (!) 159/98, pulse 76, temperature 98.6 F (37 C), temperature source Oral, resp. rate 19, height 5\' 4"  (1.626 m), weight 59 kg, SpO2 100 %.  Physical Exam Constitutional:      Comments: She is pleasant and talkative resting on a stretcher in the ED.  HENT:     Mouth/Throat:     Pharynx: No oropharyngeal exudate.  Eyes:     Conjunctiva/sclera: Conjunctivae normal.  Cardiovascular:     Rate and Rhythm: Normal rate and regular rhythm.     Heart sounds: No murmur.  Pulmonary:     Effort: Pulmonary effort is normal.     Breath sounds: Normal breath sounds.  Abdominal:     Palpations: Abdomen is soft.     Tenderness: There is no abdominal tenderness.  Musculoskeletal:        General: No swelling or tenderness.  Skin:    Findings: No rash.  Neurological:     General: No focal deficit present.  Psychiatric:        Mood and Affect: Mood normal.     Lab Results Lab Results  Component Value Date   WBC 3.1 (L) 03/15/2020   HGB 11.3 (L) 03/15/2020    HCT 35.8 (L) 03/15/2020   MCV 89.9 03/15/2020   PLT 138 (L) 03/15/2020    Lab Results  Component Value Date   CREATININE 1.03 (H) 03/15/2020   BUN 19 03/15/2020   NA 139 03/15/2020   K 4.1 03/15/2020   CL 104 03/15/2020   CO2 21 (L) 03/15/2020    Lab Results  Component Value Date   ALT 24 03/15/2020   AST 42 (H) 03/15/2020   ALKPHOS 44 03/15/2020   BILITOT 0.5 03/15/2020     Microbiology: Recent Results (from the past 240 hour(s))  SARS CORONAVIRUS 2 (TAT 6-24 HRS) Nasopharyngeal Nasopharyngeal Swab     Status: None   Collection Time: 03/15/20  6:21 AM   Specimen: Nasopharyngeal Swab  Result Value Ref Range Status   SARS Coronavirus 2 NEGATIVE NEGATIVE Final    Comment: (NOTE) SARS-CoV-2 target nucleic acids are NOT DETECTED. The SARS-CoV-2 RNA is generally detectable in upper and lower respiratory specimens during the acute phase of infection. Negative results do not preclude SARS-CoV-2 infection, do not rule out co-infections with other pathogens, and should not be used as the sole basis for treatment or other patient management decisions. Negative results must be combined with clinical observations, patient history, and epidemiological information. The expected result is Negative. Fact Sheet for Patients: SugarRoll.be Fact Sheet for Healthcare Providers: https://www.woods-mathews.com/ This test is not yet approved or cleared by the [place] FDA and  has been authorized for detection and/or diagnosis of SARS-CoV-2 by FDA under an Emergency Use Authorization (EUA). This EUA will remain  in effect (meaning this test can be used) for the duration of the COVID-19 declaration under Section 56 4(b)(1) of the Act, 21 U.S.C. section 360bbb-3(b)(1), unless the authorization is terminated or revoked sooner. Performed at [facility], [place] [address], [place], [place] [address]     Michel Bickers, [place] [facility]  [facility] [place] [facility] [phone] pager   [phone] cell 03/15/2020, 1:51 PM

## 2020-03-15 NOTE — ED Notes (Signed)
Pt to xr 

## 2020-03-15 NOTE — ED Provider Notes (Addendum)
[facility] EMERGENCY DEPARTMENT Provider Note   CSN: [id number] Arrival date & time: 03/15/20  0409     History Chief Complaint  Patient presents with  . Chest Pain    Jane Estrada is a 44 y.o. female.  The history is provided by the patient and medical records.  Chest Pain   44 year old female presenting to the ED with chest pain for several hours.  She is originally from [place] and arrived in [place] today via the freight train.  States she came here without a plan or any of her belongings as they are still at a homeless camp in [place].  States she got off the train and had a headache so was trying to walk and find the hospital and after walking for several hours she started developing chest pain.  She stopped downtown at [employer] and asked [employer] for directions to the hospital.  She was transported here by EMS.  Patient reports pain throughout the entire chest, described as sharp.  She denies any radiation.  No shortness of breath, palpitations, dizziness, or weakness.  She has not had any cough, fever, or sick contacts.  She does report history of MI, CHF, and hypertension however she is not able to provide me with any specific details of her last MI, her home medications, any cardiac care that she was receiving in [place], etc.  Past Medical History:  Diagnosis Date  . CHF (congestive heart failure) ([place])   . Hypertension   . Myocardial infarct [facility]) 2006    There are no problems to display for this patient.   History reviewed. No pertinent surgical history.   OB History   No obstetric history on file.     No family history on file.  Social History   Tobacco Use  . Smoking status: Current Every Day Smoker    Packs/day: 0.10    Types: Cigarettes  . Smokeless tobacco: Never Used  Substance Use Topics  . Alcohol use: Not Currently  . Drug use: Yes    Frequency: 1.0 times per week    Types: Marijuana    Home Medications Prior to  Admission medications   Not on File    Allergies    Lisinopril, Remeron [mirtazapine], and Abilify [aripiprazole]  Review of Systems   Review of Systems  Cardiovascular: Positive for chest pain.  All other systems reviewed and are negative.   Physical Exam Updated Vital Signs BP (!) 185/119 (BP Location: Right Arm)   Pulse 84   Temp 98.6 F (37 C) (Oral)   Resp (!) 23   Ht 5\' 4"  (1.626 m)   Wt 59 kg   LMP  (Within Days)   SpO2 100%   BMI 22.31 kg/m   Physical Exam Vitals and nursing note reviewed.  Constitutional:      Appearance: She is well-developed.     Comments: Thin but not cachectic  HENT:     Head: Normocephalic and atraumatic.  Eyes:     Conjunctiva/sclera: Conjunctivae normal.     Pupils: Pupils are equal, round, and reactive to light.  Cardiovascular:     Rate and Rhythm: Normal rate and regular rhythm.     Heart sounds: Normal heart sounds.  Pulmonary:     Effort: Pulmonary effort is normal.     Breath sounds: Normal breath sounds. No decreased breath sounds, wheezing or rhonchi.  Abdominal:     General: Bowel sounds are normal.     Palpations:  Abdomen is soft.  Musculoskeletal:        General: Normal range of motion.     Cervical back: Normal range of motion.  Skin:    General: Skin is warm and dry.  Neurological:     Mental Status: She is alert and oriented to person, place, and time.  Psychiatric:     Comments: Very difficult time staying on topic with questions asked during exam, rambling a bit     ED Results / Procedures / Treatments   Labs (all labs ordered are listed, but only abnormal results are displayed) Labs Reviewed  CBC WITH DIFFERENTIAL/PLATELET - Abnormal; Notable for the following components:      Result Value   WBC 3.1 (*)    Hemoglobin 11.3 (*)    HCT 35.8 (*)    Platelets 138 (*)    Lymphs Abs 0.5 (*)    All other components within normal limits  RAPID URINE DRUG SCREEN, HOSP PERFORMED - Abnormal; Notable for the  following components:   Amphetamines POSITIVE (*)    Tetrahydrocannabinol POSITIVE (*)    All other components within normal limits  COMPREHENSIVE METABOLIC PANEL - Abnormal; Notable for the following components:   CO2 21 (*)    Glucose, Bld 102 (*)    Creatinine, Ser 1.03 (*)    AST 42 (*)    All other components within normal limits  TROPONIN I (HIGH SENSITIVITY) - Abnormal; Notable for the following components:   Troponin I (High Sensitivity) 51 (*)    All other components within normal limits  SARS CORONAVIRUS 2 (TAT 6-24 HRS)  ETHANOL    EKG EKG Interpretation  Date/Time:  Monday March 15 2020 04:15:53 EDT Ventricular Rate:  81 PR Interval:    QRS Duration: 142 QT Interval:  425 QTC Calculation: 494 R Axis:   81 Text Interpretation: Sinus rhythm Left ventricular hypertrophy Probable anterior infarct, old No old tracing to compare Confirmed by Delora Fuel ([id number]) on 03/15/2020 4:41:04 AM   Radiology DG Chest 2 View  Result Date: 03/15/2020 CLINICAL DATA:  Chest pain and lethargy. EXAM: CHEST - 2 VIEW COMPARISON:  None. FINDINGS: The heart size and mediastinal contours are within normal limits. Both lungs are clear. The visualized skeletal structures are unremarkable. IMPRESSION: Normal chest x-ray. Electronically Signed   By: Marijo Sanes M.D.   On: 03/15/2020 05:25    Procedures Procedures (including critical care time)  CRITICAL CARE Performed by: Larene Pickett   Total critical care time: 35 minutes  Critical care time was exclusive of separately billable procedures and treating other patients.  Critical care was necessary to treat or prevent imminent or life-threatening deterioration.  Critical care was time spent personally by me on the following activities: development of treatment plan with patient and/or surrogate as well as nursing, discussions with consultants, evaluation of patient's response to treatment, examination of patient, obtaining history from  patient or surrogate, ordering and performing treatments and interventions, ordering and review of laboratory studies, ordering and review of radiographic studies, pulse oximetry and re-evaluation of patient's condition.   Medications Ordered in ED Medications  aspirin chewable tablet 324 mg (324 mg Oral Given 03/15/20 [id number])    ED Course  I have reviewed the triage vital signs and the nursing notes.  Pertinent labs & imaging results that were available during my care of the patient were reviewed by me and considered in my medical decision making (see chart for details).    MDM Rules/Calculators/A&P  44 year old female presenting to the ED with chest pain.  She rode a train from [place] to [facility] today and while walking she developed chest pain.  She was picked up outside of [employer] [place] and transported here for further evaluation.  She does report being homeless in [place], was staying at a homeless camp and actually left all of her belongings there.  She reports history of MI, CHF, and hypertension but is not able to give me a lot of details regarding her care or ongoing medications.  EKG here with [facility], no prior for comparison.  Chest x-ray was performed and is negative.  Will send screening labs.  Once patient was fully registered with [facility] number, some records were available in care everywhere.  It does appear she has had sporadic medical care over the past few years.  She did have an admission to hospital in March 2020 for CHF.  She refused home antihypertensives and was treated with hydralazine while inpatient.  It also appears she has HIV which has been untreated for several years (reports she had bad side effects from all the medications they tried over the past few years).  Here, labs with troponin of 51.  She does not appear clinically fluid overloaded.  Her initial BP was 185/119, however has trended down to 145/94 without further intervention.  Will  discuss with cardiology.    6:16 AM Spoke with cardiology.  Currently pressure is 147/95 so will hold further antihypertensives here.  She was give ASA, hold on heparin for now.  Will admit to medicine given other untreated medical issues (notably HIV).  They will consult today and provide recommendations.  Discussed with hospitalist, Dr. Alcario Drought-- morning team will see patient and admit.  Final Clinical Impression(s) / ED Diagnoses Final diagnoses:  Chest pain in adult  Elevated troponin  HIV infection, unspecified symptom status [facility])  Essential hypertension    Rx / DC Orders ED Discharge Orders    None       Larene Pickett, PA-C 03/15/20 0636    Larene Pickett, PA-C [id number] [phone]    Delora Fuel, MD [id number] [phone]

## 2020-03-15 NOTE — Progress Notes (Signed)
Pt alert and oriented x4. Stable, no acute distress noted. Pt stated she was feeling hot. Pt afebrile. Room temperature decreased and patient advised to take some blankets off. Pt sweating so much, bed fitted sheets and pillowcase was very wet. Pt denied being anxious.

## 2020-03-15 NOTE — ED Triage Notes (Signed)
Pt arrived from [employer] via [employer] c/o cp which started "hours ago". 6/10 initially. EMS gave 4 ASA and 1 nitro enroute which brought pain to 4/10. Describes pain as sharp. HT at 185/119 otherwise vss. Pt a+ox4 at this time

## 2020-03-15 NOTE — Progress Notes (Signed)
  Echocardiogram 2D Echocardiogram has been performed.  Jane Estrada 03/15/2020, 3:12 PM

## 2020-03-15 NOTE — Consult Note (Signed)
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)   Polysubstance abuse ([place])   Pancytopenia ([place])   Total Time spent with patient: 1 hour  Subjective:   Jane Estrada is a 44 y.o. female patient admitted with medical issues, consult for bipolar medication assessment.  Patient seen and evaluated in person by this provider regarding bipolar medications.  Client presents with no signs or symptoms of mania, paranoia, suicidal/homicidal ideations, hallucinations, or withdrawal symptoms.  She was using methamphetamine prior to admission.  Client reports a diagnosis of bipolar d/o since the age of [age] which is highly unlikely as children that age are seldom correctly diagnosed with bipolar at such an early age.  Even so, client reports being on multiple medications for bipolar and "they make it worse", she is not interested in taking any medications.  Low level of depression r/t social situation and health issues, anxiety moderate.  Minimizes her substance use, recommend therapy and/or substance abuse outpatient resources, see treatment plan below.  HPI per MD:  Jane Estrada is a 44 y.o. female with medical history significant of CAD; HTN; systolic CHF; bipolar d/o; tobacco dependence; polysubstance abuse; granular cell tumor of the trachea; HIV; and homelessness presenting with chest pain.  She took a train from [place] to here - she was "dying in [place].   Literally."  She knew that she wouldn't last much longer, "basically trying to kill myself every day" - not taking medications and making poor life choices.  Her husband died and she "decided to honor him in death rather than treat him right while he was alive."  She started walking from [place], [place] to [facility] "and the police kept harassing me."  Someone drove her to [place], [place].  She "had a warning that I needed to continue my journey."  God told her to leave all of her stuff behind and hop a freight train.  She ended up in [place], [place].  She was yelling and screaming on a bench outside of [employer] because she left everything behind and the police came.  She was mad at God because He said He would provide for her.  An officer paid for her train ticket from [place] to here.  She has no connections.  She doesn't know where she will settle.  "The doctors wanted to keep on bombarding my body with a whole bunch of medicine."  The medicines made her feel worse.  She was supposed to be taking meds for HIV, CHF, depression...  She developed chest pains and a headache - she got a headache not long after she got off the train.  She started walking trying to find the hospital.  She found a Engineer, structural by [place] and told him she needed the hospital.  She walked further down the street and she asked for directions.  Finally a bystander called 911.  The chest pain was on the left side of her chest.  Nothing made it worse, "it was just annoying."  It is not completely gone, "still a  little twinge every now and then."  Mild SOB.  Mild sore throat.  She reports unwillingness to take any medications other than possibly prn Lasix.  Past Psychiatric History:  Polysubstance use d/o  Risk to Self:  none Risk to Others:  none Prior Inpatient Therapy:  denies Prior Outpatient Therapy:  past, not present  Past Medical History:  Past Medical History:  Diagnosis Date  . Bipolar affective ([place])   . Chronic systolic CHF  (congestive heart failure) ([place])   . Granular cell tumor   . HIV (human immunodeficiency virus infection) ([place])   . Hypertension   . Myocardial infarct ([place]) 2006  . Tobacco dependence     Past Surgical History:  Procedure Laterality Date  . none     Family History: History reviewed. No pertinent family history. Family Psychiatric  History: none Social History:  Social History   Substance and Sexual Activity  Alcohol Use Not Currently     Social History   Substance and Sexual Activity  Drug Use Yes  . Frequency: 1.0 times per week  . Types: Marijuana, Amphetamines    Social History   Socioeconomic History  . Marital status: Single    Spouse name: Not on file  . Number of children: Not on file  . Years of education: Not on file  . Highest education level: Not on file  Occupational History  . Occupation: unemployed  Tobacco Use  . Smoking status: Current Every Day Smoker    Packs/day: 0.50    Types: Cigarettes  . Smokeless tobacco: Never Used  Substance and Sexual Activity  . Alcohol use: Not Currently  . Drug use: Yes    Frequency: 1.0 times per week    Types: Marijuana, Amphetamines  . Sexual activity: Not Currently  Other Topics Concern  . Not on file  Social History Narrative  . Not on file   Social Determinants of Health   Financial Resource Strain:   . Difficulty of Paying Living Expenses:   Food Insecurity:   . Worried About Charity fundraiser in the Last Year:   . Arboriculturist in the Last Year:   Transportation Needs:   . Film/video editor (Medical):   Marland Kitchen Lack of Transportation (Non-Medical):   Physical Activity:   . Days of Exercise per Week:   . Minutes of Exercise per Session:   Stress:   . Feeling of Stress :   Social Connections:   . Frequency of Communication with Friends and Family:   . Frequency of Social Gatherings with Friends and Family:   . Attends Religious Services:   . Active Member of Clubs or Organizations:   .  Attends Archivist Meetings:   Marland Kitchen Marital Status:    Additional Social History:    Allergies:   Allergies  Allergen Reactions  . Lisinopril Anaphylaxis  . Remeron [Mirtazapine] Shortness Of Breath  . Abilify [Aripiprazole] Rash    Labs:  Results for orders placed or performed during the hospital encounter of 03/15/20 (from the past 48 hour(s))  CBC with Differential     Status: Abnormal   Collection Time: 03/15/20  4:39 AM  Result Value Ref Range   WBC 3.1 (L) 4.0 - 10.5 K/uL   RBC 3.98 3.87 - 5.11 MIL/uL   Hemoglobin 11.3 (L) 12.0 - 15.0 g/dL   HCT 35.8 (L) 36.0 - 46.0 %   MCV 89.9 80.0 - 100.0 fL   MCH 28.4 26.0 - 34.0 pg  MCHC 31.6 30.0 - 36.0 g/dL   RDW 15.3 11.5 - 15.5 %   Platelets 138 (L) 150 - 400 K/uL   nRBC 0.0 0.0 - 0.2 %   Neutrophils Relative % 70 %   Neutro Abs 2.1 1.7 - 7.7 K/uL   Lymphocytes Relative 16 %   Lymphs Abs 0.5 (L) 0.7 - 4.0 K/uL   Monocytes Relative 13 %   Monocytes Absolute 0.4 0.1 - 1.0 K/uL   Eosinophils Relative 1 %   Eosinophils Absolute 0.0 0.0 - 0.5 K/uL   Basophils Relative 0 %   Basophils Absolute 0.0 0.0 - 0.1 K/uL   Immature Granulocytes 0 %   Abs Immature Granulocytes 0.01 0.00 - 0.07 K/uL    Comment: Performed at [facility] [address], [place], [place] [address]  Troponin I (High Sensitivity)     Status: Abnormal   Collection Time: 03/15/20  4:39 AM  Result Value Ref Range   Troponin I (High Sensitivity) 51 (H) <18 ng/L    Comment: (NOTE) Elevated high sensitivity troponin I (hsTnI) values and significant  changes across serial measurements may suggest ACS but many other  chronic and acute conditions are known to elevate hsTnI results.  Refer to the "Links" section for chest pain algorithms and additional  guidance. Performed at [facility], [place] [address] [place], [place] [address]   Ethanol     Status: None   Collection Time: 03/15/20  4:39 AM  Result Value Ref Range   Alcohol, Ethyl  (B) <10 <10 mg/dL    Comment: (NOTE) Lowest detectable limit for serum alcohol is 10 mg/dL. For medical purposes only. Performed at [facility], [place] [address] [place], [place] [address]   Rapid urine drug screen (hospital performed)     Status: Abnormal   Collection Time: 03/15/20  4:39 AM  Result Value Ref Range   Opiates NONE DETECTED NONE DETECTED   Cocaine NONE DETECTED NONE DETECTED   Benzodiazepines NONE DETECTED NONE DETECTED   Amphetamines POSITIVE (A) NONE DETECTED   Tetrahydrocannabinol POSITIVE (A) NONE DETECTED   Barbiturates NONE DETECTED NONE DETECTED    Comment: (NOTE) DRUG SCREEN FOR MEDICAL PURPOSES ONLY.  IF CONFIRMATION IS NEEDED FOR ANY PURPOSE, NOTIFY LAB WITHIN 5 DAYS. LOWEST DETECTABLE LIMITS FOR URINE DRUG SCREEN Drug Class                     Cutoff (ng/mL) Amphetamine and metabolites    1000 Barbiturate and metabolites    200 Benzodiazepine                 [id number] Tricyclics and metabolites     300 Opiates and metabolites        300 Cocaine and metabolites        300 THC                            50 Performed at [facility], [place] [address], [place], [place] [address]   Comprehensive metabolic panel     Status: Abnormal   Collection Time: 03/15/20  4:40 AM  Result Value Ref Range   Sodium 139 135 - 145 mmol/L   Potassium 4.1 3.5 - 5.1 mmol/L   Chloride 104 98 - 111 mmol/L   CO2 21 (L) 22 - 32 mmol/L   Glucose, Bld 102 (H) 70 - 99 mg/dL    Comment: Glucose  reference range applies only to samples taken after fasting for at least 8 hours.   BUN 19 6 - 20 mg/dL   Creatinine, Ser 1.03 (H) 0.44 - 1.00 mg/dL   Calcium 9.7 8.9 - 10.3 mg/dL   Total Protein 7.6 6.5 - 8.1 g/dL   Albumin 3.8 3.5 - 5.0 g/dL   AST 42 (H) 15 - 41 U/L   ALT 24 0 - 44 U/L   Alkaline Phosphatase 44 38 - 126 U/L   Total Bilirubin 0.5 0.3 - 1.2 mg/dL   GFR calc non Af Amer >60 >60 mL/min   GFR calc Af Amer >60 >60 mL/min   Anion gap 14 5 - 15    Comment:  Performed at [facility], [place] [address] [place], [place] [address]  SARS CORONAVIRUS 2 (TAT 6-24 HRS) Nasopharyngeal Nasopharyngeal Swab     Status: None   Collection Time: 03/15/20  6:21 AM   Specimen: Nasopharyngeal Swab  Result Value Ref Range   SARS Coronavirus 2 NEGATIVE NEGATIVE    Comment: (NOTE) SARS-CoV-2 target nucleic acids are NOT DETECTED. The SARS-CoV-2 RNA is generally detectable in upper and lower respiratory specimens during the acute phase of infection. Negative results do not preclude SARS-CoV-2 infection, do not rule out co-infections with other pathogens, and should not be used as the sole basis for treatment or other patient management decisions. Negative results must be combined with clinical observations, patient history, and epidemiological information. The expected result is Negative. Fact Sheet for Patients: SugarRoll.be Fact Sheet for Healthcare Providers: https://www.woods-mathews.com/ This test is not yet approved or cleared by the [place] FDA and  has been authorized for detection and/or diagnosis of SARS-CoV-2 by FDA under an Emergency Use Authorization (EUA). This EUA will remain  in effect (meaning this test can be used) for the duration of the COVID-19 declaration under Section 56 4(b)(1) of the Act, 21 U.S.C. section 360bbb-3(b)(1), unless the authorization is terminated or revoked sooner. Performed at [facility], [place] [address], [place], [place] [address]   Troponin I (High Sensitivity)     Status: Abnormal   Collection Time: 03/15/20 10:35 AM  Result Value Ref Range   Troponin I (High Sensitivity) 34 (H) <18 ng/L    Comment: (NOTE) Elevated high sensitivity troponin I (hsTnI) values and significant  changes across serial measurements may suggest ACS but many other  chronic and acute conditions are known to elevate hsTnI results.  Refer to the "Links" section for chest pain  algorithms and additional  guidance. Performed at [facility], [place] [address] [place], [place] [address]   Lipid panel     Status: Abnormal   Collection Time: 03/15/20 10:35 AM  Result Value Ref Range   Cholesterol 169 0 - 200 mg/dL   Triglycerides 47 <150 mg/dL   HDL 46 >40 mg/dL   Total CHOL/HDL Ratio 3.7 RATIO   VLDL 9 0 - 40 mg/dL   LDL Cholesterol 114 (H) 0 - 99 mg/dL    Comment:        Total Cholesterol/HDL:CHD Risk Coronary Heart Disease Risk Table                     Men   Women  1/2 Average Risk   3.4   3.3  Average Risk       5.0   4.4  2 X Average Risk   9.6   7.1  3 X Average Risk  23.4   11.0        Use the calculated Patient Ratio above and the CHD Risk Table to determine the patient's CHD Risk.        ATP III CLASSIFICATION (LDL):  <100     mg/dL   Optimal  100-129  mg/dL   Near or Above                    Optimal  130-159  mg/dL   Borderline  160-189  mg/dL   High  >190     mg/dL   Very High Performed at [facility] [address] [place], [place] [address]   T-helper cells (CD4) count (not at [facility])     Status: Abnormal   Collection Time: 03/15/20 10:35 AM  Result Value Ref Range   CD4 T Cell Abs 41 (L) 400 - 1,790 /uL   CD4 % Helper T Cell 13 (L) 33 - 65 %    Comment: Performed at [facility], [place] [address], [place], [place] [address]  TSH     Status: None   Collection Time: 03/15/20 12:34 PM  Result Value Ref Range   TSH 0.921 0.350 - 4.500 uIU/mL    Comment: Performed by a 3rd Generation assay with a functional sensitivity of <=0.01 uIU/mL. Performed at [facility], [place] [address] [place], [place] [address]   Troponin I (High Sensitivity)     Status: Abnormal   Collection Time: 03/15/20 12:34 PM  Result Value Ref Range   Troponin I (High Sensitivity) 30 (H) <18 ng/L    Comment: (NOTE) Elevated high sensitivity troponin I (hsTnI) values and significant  changes across serial measurements may suggest  ACS but many other  chronic and acute conditions are known to elevate hsTnI results.  Refer to the "Links" section for chest pain algorithms and additional  guidance. Performed at [facility], [place] [address], [place], [place] [address]     Current Facility-Administered Medications  Medication Dose Route Frequency Provider Last Rate Last Admin  . acetaminophen (TYLENOL) tablet 650 mg  650 mg Oral Q6H PRN Karmen Bongo, MD       Or  . acetaminophen (TYLENOL) suppository 650 mg  650 mg Rectal Q6H PRN Karmen Bongo, MD      . amLODipine (NORVASC) tablet 5 mg  5 mg Oral Daily Kathyrn Drown D, NP   5 mg at 03/15/20 1235  . [START ON 03/16/2020] aspirin EC tablet 81 mg  81 mg Oral Daily Karmen Bongo, MD      . bisacodyl (DULCOLAX) EC tablet 5 mg  5 mg Oral Daily PRN Karmen Bongo, MD      . docusate sodium (COLACE) capsule 100 mg  100 mg Oral BID Karmen Bongo, MD   100 mg at 03/15/20 1235  . enoxaparin (LOVENOX) injection 40 mg  40 mg Subcutaneous Q24H Karmen Bongo, MD   40 mg at 03/15/20 1235  . hydrALAZINE (APRESOLINE) injection 5 mg  5 mg Intravenous Q4H PRN Karmen Bongo, MD      . nicotine (NICODERM CQ - dosed in mg/24 hours) patch 14 mg  14 mg Transdermal Daily Karmen Bongo, MD      . ondansetron [facility]) tablet 4 mg  4 mg Oral Q6H PRN Karmen Bongo, MD       Or  . ondansetron [facility]) injection 4 mg  4 mg Intravenous Q6H PRN Karmen Bongo, MD      .  polyethylene glycol (MIRALAX / GLYCOLAX) packet 17 g  17 g Oral Daily PRN Karmen Bongo, MD   17 g at 03/15/20 1517  . zolpidem (AMBIEN) tablet 5 mg  5 mg Oral QHS PRN Karmen Bongo, MD        Musculoskeletal: Strength & Muscle Tone: decreased Gait & Station: did not witness Patient leans: N/A  Psychiatric Specialty Exam: Physical Exam  Nursing note and vitals reviewed. Constitutional: She is oriented to person, place, and time. She appears well-developed.  HENT:  Head: Normocephalic.  Respiratory:  Effort normal.  Musculoskeletal:        General: Normal range of motion.     Cervical back: Normal range of motion.  Neurological: She is alert and oriented to person, place, and time.  Psychiatric: Her speech is normal and behavior is normal. Judgment and thought content normal. Cognition and memory are normal. She exhibits a depressed mood.    Review of Systems  Psychiatric/Behavioral: Positive for dysphoric mood.  All other systems reviewed and are negative.   Blood pressure (!) 157/109, pulse 89, temperature 98 F (36.7 C), temperature source Oral, resp. rate 18, height 5\' 4"  (1.626 m), weight 59 kg, last menstrual period 03/13/2020, SpO2 100 %.Body mass index is 22.31 kg/m.  General Appearance: Casual  Eye Contact:  Good  Speech:  Clear and Coherent  Volume:  Normal  Mood:  Depressed  Affect:  Congruent  Thought Process:  Coherent and Descriptions of Associations: Intact  Orientation:  Full (Time, Place, and Person)  Thought Content:  WDL and Logical  Suicidal Thoughts:  No  Homicidal Thoughts:  No  Memory:  Immediate;   Good Recent;   Good Remote;   Good  Judgement:  Fair  Insight:  Fair  Psychomotor Activity:  Decreased  Concentration:  Concentration: Good and Attention Span: Good  Recall:  Good  Fund of Knowledge:  Fair  Language:  Good  Akathisia:  No  Handed:  Right  AIMS (if indicated):     Assets:  Leisure Time Resilience  ADL's:  Intact  Cognition:  WNL  Sleep:      44 yo female with chronic health issues, consult for bipolar medications.  Uncertain if client has bipolar d/o as her symptoms are not consistent with the diagnosis especially with the confounding variable of years of substance use.  Refuses medication assistance, encouraged therapy and substance abuse services.  No suicidal/homicidal ideations, hallucinations, and withdrawal symptoms.  Treatment Plan Summary: Amphetamine induced mood disorder: -Offered medications, patient declined "they make  me worse" -Recommend therapy at [facility] or [facility] -Recommend substance abuse treatment at [facility] [place] in [place], [place]: [email or web address] Get online care: [email or web address] Address: [place], [place], [place] [address] Hours:  Closed ? Tobie Poet Phone: [phone]  [facility] in [place], [place] COVID-19 info: adsyes.org Address: [address] [place], [place] [address] Hours:  Closed ? Opens 6AM Phone: [phone] Check insurance info  Disposition: No evidence of imminent risk to self or others at present.    Waylan Boga, NP 03/15/2020 5:40 PM

## 2020-03-15 NOTE — H&P (Signed)
History and Physical    Jane Estrada [id number] DOB: [birth year] DOA: 03/15/2020  PCP: Patient, No Pcp Per Consultants:  [facility] - ENT, and [place] - ID, in [place], [place] Patient coming from: Homeless, recently moved to [place]; NOK: No one  Chief Complaint: chest pain  HPI: Jane Estrada is a 44 y.o. female with medical history significant of CAD; HTN; systolic CHF; bipolar d/o; tobacco dependence; polysubstance abuse; granular cell tumor of the trachea; HIV; and homelessness presenting with chest pain.  She took a train from [place] to here - she was "dying in [place].  Literally."  She knew that she wouldn't last much longer, "basically trying to kill myself every day" - not taking medications and making poor life choices.  Her husband died and she "decided to honor him in death rather than treat him right while he was alive."  She started walking from [place], [place] to [facility] "and the police kept harassing me."  Someone drove her to [place], [place].  She "had a warning that I needed to continue my journey."  God told her to leave all of her stuff behind and hop a freight train.  She ended up in [place], [place].  She was yelling and screaming on a bench outside of [employer] because she left everything behind and the police came.  She was mad at God because He said He would provide for her.  An officer paid for her train ticket from [place] to here.  She has no connections.  She doesn't know where she will settle.  "The doctors wanted to keep on bombarding my body with a whole bunch of medicine."  The medicines made her feel worse.  She was supposed to be taking meds for HIV, CHF, depression...  She developed chest pains and a headache - she got a headache not long after she got off the train.  She started walking trying to find the hospital.  She found a Engineer, structural by [place] and told him she needed the hospital.  She walked further down the street and she asked for directions.  Finally a bystander  called 911.  The chest pain was on the left side of her chest.  Nothing made it worse, "it was just annoying."  It is not completely gone, "still a little twinge every now and then."  Mild SOB.  Mild sore throat.  She reports unwillingness to take any medications other than possibly prn Lasix.  She has h/o recurrent pharyngitis with a prior granular cell tumor of the trachea; she had unremarkable laryngoscopy in 11/2019.  CD4 count in 02/2019 was 146.  Viral load 5751 in 02/2019.  Echo in 2016 with EF 50-55%.   ED Course:  Carryover, per Dr. Alcario Drought:  44 yo F, homeless, just moved to [place], CP elevated trop. Untreated HIV, untreated HTN, EDP spoke with cards and will see patient.  Review of Systems: As per HPI; otherwise review of systems reviewed and negative.   Ambulatory Status:  Ambulates without assistance  COVID Vaccine Status:  None - not planning to take them, "it's against my religion"  Past Medical History:  Diagnosis Date  . Bipolar affective ([place])   . Chronic systolic CHF (congestive heart failure) ([place])   . Granular cell tumor   . HIV (human immunodeficiency virus infection) ([place])   . Hypertension   . Myocardial infarct ([place]) 2006  . Tobacco dependence     Past Surgical History:  Procedure Laterality Date  . none  Social History   Socioeconomic History  . Marital status: Single    Spouse name: Not on file  . Number of children: Not on file  . Years of education: Not on file  . Highest education level: Not on file  Occupational History  . Occupation: unemployed  Tobacco Use  . Smoking status: Current Every Day Smoker    Packs/day: 0.50    Types: Cigarettes  . Smokeless tobacco: Never Used  Substance and Sexual Activity  . Alcohol use: Not Currently  . Drug use: Yes    Frequency: 1.0 times per week    Types: Marijuana, Amphetamines  . Sexual activity: Not Currently  Other Topics Concern  . Not on file  Social History Narrative  . Not on file     Social Determinants of Health   Financial Resource Strain:   . Difficulty of Paying Living Expenses:   Food Insecurity:   . Worried About Charity fundraiser in the Last Year:   . Arboriculturist in the Last Year:   Transportation Needs:   . Film/video editor (Medical):   Marland Kitchen Lack of Transportation (Non-Medical):   Physical Activity:   . Days of Exercise per Week:   . Minutes of Exercise per Session:   Stress:   . Feeling of Stress :   Social Connections:   . Frequency of Communication with Friends and Family:   . Frequency of Social Gatherings with Friends and Family:   . Attends Religious Services:   . Active Member of Clubs or Organizations:   . Attends Archivist Meetings:   Marland Kitchen Marital Status:   Intimate Partner Violence:   . Fear of Current or Ex-Partner:   . Emotionally Abused:   Marland Kitchen Physically Abused:   . Sexually Abused:     Allergies  Allergen Reactions  . Lisinopril Anaphylaxis  . Remeron [Mirtazapine] Shortness Of Breath  . Abilify [Aripiprazole] Rash    History reviewed. No pertinent family history.  Prior to Admission medications   Medication Sig Start Date End Date Taking? Authorizing Provider  bictegravir-emtricitabine-tenofovir AF (BIKTARVY) 50-200-25 MG TABS tablet Take 1 tablet by mouth daily. 02/25/19   [provider]  furosemide (LASIX) 40 MG tablet Take 40 mg by mouth daily. 02/23/20   [provider]  QUEtiapine (SEROQUEL) 25 MG tablet Take 25 mg by mouth 2 (two) times daily. 11/24/19   [provider]    Physical Exam: Vitals:   03/15/20 0430 03/15/20 0600 03/15/20 0700 03/15/20 0800  BP: (!) 160/122 (!) 145/94 137/87 (!) 150/99  Pulse: 82  76 73  Resp: 15 19 16 15   Temp:      TempSrc:      SpO2: 100%  99% 100%  Weight:      Height:         . General:  Appears calm and comfortable and is NAD; she is somewhat somnolent . Eyes:  PERRL, EOMI, normal lids, iris . ENT:  grossly normal hearing, lips  & tongue without obvious thrush, mmm; mildly poor dentition . Neck:  no LAD, masses or thyromegaly . Cardiovascular:  RRR, no m/r/g. No LE edema.  Marland Kitchen Respiratory:   CTA bilaterally with no wheezes/rales/rhonchi.  Normal respiratory effort. . Abdomen:  soft, NT, ND, NABS . Back:   normal alignment, no CVAT . Skin:  no rash or induration seen on limited exam . Musculoskeletal:  grossly normal tone BUE/BLE, good ROM, no bony abnormality . Psychiatric:  grossly  normal mood and affect, speech fluent and appropriate, AOx3 . Neurologic:  CN 2-12 grossly intact, moves all extremities in coordinated fashion    Radiological Exams on Admission: DG Chest 2 View  Result Date: 03/15/2020 CLINICAL DATA:  Chest pain and lethargy. EXAM: CHEST - 2 VIEW COMPARISON:  None. FINDINGS: The heart size and mediastinal contours are within normal limits. Both lungs are clear. The visualized skeletal structures are unremarkable. IMPRESSION: Normal chest x-ray. Electronically Signed   By: Marijo Sanes M.D.   On: 03/15/2020 05:25    EKG: Independently reviewed.  NSR with rate 81; LVH; nonspecific ST changes with no evidence of acute ischemia   Labs on Admission: I have personally reviewed the available labs and imaging studies at the time of the admission.  Pertinent labs:   Glucose 102 AST 42/ALT 24 HS troponin 51 WBC 3.1 Hgb 11.3 Platelets 138 ETOH <10 UDS + amphetamines, THC   Assessment/Plan Principal Problem:   Chest pain Active Problems:   Tobacco dependence   Hypertension   HIV (human immunodeficiency virus infection) (HCC)   Granular cell tumor   Chronic systolic CHF (congestive heart failure) (HCC)   Bipolar affective (HCC)   Chest pain -Patient with reported h/o MI although this was not seen on brief review of records presenting with CP -She does have h/o systolic CHF (see below) -Patient with left-sided CP that may have been exertional, poor history. -0-1/3 typical symptoms suggestive  of noncardiac chest pain.  -CXR unremarkable.   -Initial cardiac HS troponin mildly elevated; will repeat to consider delta troponin -EKG not indicative of acute ischemia.   -HEART pathway score is 3; however, with the elevated troponin and requires further evaluation. -Will plan to place in observation status on telemetry to rule out ACS by overnight observation.  -Repeat EKG in AM -Start ASA 81 mg daily -Risk factor stratification with FLP; will also check TSH -Cardiology consultation   HTN -Patient acknowledges not taking medication for this issue and is resistant to all medications at this time  -Will add prn hydralazine  Chronic systolic CHF -Most recent Echo was 1 year ago with improved but persistent systolic CHF -Recheck Echo -Hold medications at this time - she reports that she can still become pregnant and would not want to endanger the life of a fetus and so cannot take medications for this issue -Cardiology consult is pending  Bipolar d/o -Patient appears to have a religious concern about all prescribed medications  -Her responses to questions raise concern for delusional disorder -She is at significant risk for self harm if she continues to use illicit substances and continues to refuse prescribed medications -She was prescribed Seroquel in the past but it is not clear if she took this -Psychiatry consult requested  Untreated HIV -Patient reports that medications made her feel worse in the past, bit it appears that she was prescribed Biktarvy and never actually picked it up -Recheck CD4 and viral load -ID consult requested -At a minimum, she needs to be plugged in to our system to provide her with resources if she is planning to stay in this community  Polysubstance abuse -UDS + for marijuana and amphetamines -Also with h/o cocaine use in the past -TOC team consult requested  Homelessness -Patient appears to have randomly ended up in our community and has no  belongings or ties to the area -She will need housing/shelter assistance or at least support in some way -TOC team consult requested  Tobacco dependence -Encourage cessation.   -  Patch ordered    Note: This patient has been tested and is pending for the novel coronavirus COVID-19.   DVT prophylaxis: Lovenox  Code Status:  Full - confirmed with patient Family Communication: None present Disposition Plan:  She is anticipated to d/c to homeless shelter once her cardiology issues have been resolved. Consults called: Cardiology; Psychiatry; ID; TOC team  Admission status: It is my clinical opinion that referral for OBSERVATION is reasonable and necessary in this patient based on the above information provided. The aforementioned taken together are felt to place the patient at high risk for further clinical deterioration. However it is anticipated that the patient may be medically stable for discharge from the hospital within 24 to 48 hours.      Karmen Bongo MD [facility]   How to contact the [facility] Attending or Consulting provider [place] or covering provider during after hours [place], for this patient?  1. Check the care team in [facility] and look for a) attending/consulting TRH provider listed and b) the [facility] team listed 2. Log into [email or web address] and use [place]'s universal password to access. If you do not have the password, please contact the hospital operator. 3. Locate the [facility] provider you are looking for under [facility] and page to a number that you can be directly reached. 4. If you still have difficulty reaching the provider, please page the [facility] (Director on Call) for the Hospitalists listed on amion for assistance.   03/15/2020, 10:26 AM

## 2020-03-16 ENCOUNTER — Observation Stay (HOSPITAL_BASED_OUTPATIENT_CLINIC_OR_DEPARTMENT_OTHER): Payer: Medicaid - Out of State

## 2020-03-16 DIAGNOSIS — I5022 Chronic systolic (congestive) heart failure: Secondary | ICD-10-CM

## 2020-03-16 DIAGNOSIS — F17200 Nicotine dependence, unspecified, uncomplicated: Secondary | ICD-10-CM

## 2020-03-16 DIAGNOSIS — R079 Chest pain, unspecified: Secondary | ICD-10-CM

## 2020-03-16 LAB — NM MYOCAR MULTI W/SPECT W/WALL MOTION / EF
Estimated workload: 1 METS
Exercise duration (min): 0 min
Exercise duration (sec): 0 s
MPHR: 178 {beats}/min
Peak HR: 97 {beats}/min
Percent HR: 54 %
Rest HR: 77 {beats}/min

## 2020-03-16 MED ORDER — REGADENOSON 0.4 MG/5ML IV SOLN
0.4000 mg | Freq: Once | INTRAVENOUS | Status: AC
  Administered 2020-03-16: 09:00:00 0.4 mg via INTRAVENOUS
  Filled 2020-03-16: qty 5

## 2020-03-16 MED ORDER — REGADENOSON 0.4 MG/5ML IV SOLN
INTRAVENOUS | Status: AC
  Filled 2020-03-16: qty 5

## 2020-03-16 MED ORDER — AMLODIPINE BESYLATE 10 MG PO TABS
10.0000 mg | ORAL_TABLET | Freq: Every day | ORAL | Status: DC
  Administered 2020-03-17: 11:00:00 10 mg via ORAL
  Filled 2020-03-16: qty 1

## 2020-03-16 MED ORDER — AMLODIPINE BESYLATE 5 MG PO TABS
5.0000 mg | ORAL_TABLET | Freq: Once | ORAL | Status: AC
  Administered 2020-03-16: 13:00:00 5 mg via ORAL
  Filled 2020-03-16: qty 1

## 2020-03-16 MED ORDER — TECHNETIUM TC 99M TETROFOSMIN IV KIT
10.6000 | PACK | Freq: Once | INTRAVENOUS | Status: AC | PRN
  Administered 2020-03-16: 10.6 via INTRAVENOUS

## 2020-03-16 MED ORDER — HYDRALAZINE HCL 20 MG/ML IJ SOLN
INTRAMUSCULAR | Status: AC
  Filled 2020-03-16: qty 1

## 2020-03-16 MED ORDER — TECHNETIUM TC 99M TETROFOSMIN IV KIT
31.9000 | PACK | Freq: Once | INTRAVENOUS | Status: AC | PRN
  Administered 2020-03-16: 10:00:00 31.9 via INTRAVENOUS

## 2020-03-16 MED ORDER — METOPROLOL TARTRATE 12.5 MG HALF TABLET
12.5000 mg | ORAL_TABLET | Freq: Two times a day (BID) | ORAL | Status: DC
  Administered 2020-03-16 – 2020-03-17 (×3): 12.5 mg via ORAL
  Filled 2020-03-16 (×3): qty 1

## 2020-03-16 MED ORDER — HYDRALAZINE HCL 25 MG PO TABS
25.0000 mg | ORAL_TABLET | Freq: Three times a day (TID) | ORAL | Status: DC
  Administered 2020-03-16 – 2020-03-17 (×3): 25 mg via ORAL
  Filled 2020-03-16 (×4): qty 1

## 2020-03-16 NOTE — Progress Notes (Signed)
   Patient presented for a nuclear stress test today. No immediate complications. Stress imaging is pending at this time.   Preliminary EKG findings may be listed in the chart, but the stress test result will not be finalized until perfusion imaging is complete.  Of note, patient markedly hypertensive. BP 186/130 on arrival to nuclear medicine. Peaked to 197/130 during test. No stroke like symptoms with this. Patient had not received any of antihypertensive medications this morning. She has PRN IV Hydralazine 5mg  ordered so we went ahead and gave her a dose down in nuclear medicine.   Darreld Mclean, PA-C 03/16/2020 9:48 AM

## 2020-03-16 NOTE — Progress Notes (Signed)
Patient ID: Jane Estrada, female   DOB: Aug 27, 1976, 44 y.o.   MRN: [id number]         [facility] for Infectious Disease  Date of Admission:  03/15/2020     ASSESSMENT: Her CD4 count is at the lowest level (41) since she was diagnosed 16 years ago.  She told me again today that she is unwilling to start medication for her HIV infection.  This includes antiretroviral therapy and pneumocystis prophylaxis.  She is reluctant to start because she says that she has intentionally overdosed on every medication she has ever been prescribed.  She also says that she feels like God has a plan for her and will take care of her.  She also mentions that she likes to deal with her medical problems through healthy living and eating vegetables and fruit.  However, she said that she would not completely rule out the possibility of taking medication.  She is not sure if she is going to stay in [place] beyond 30 days.  She says that she is willing to come see me in clinic.  PLAN: 1. Follow-up appointment at [place] on 04/01/2020 at 3 PM 2. 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 [START ON 03/17/2020] amLODipine  10 mg Oral Daily  . aspirin EC  81 mg Oral Daily  . docusate sodium  100 mg Oral BID  . enoxaparin (LOVENOX) injection  40 mg Subcutaneous Q24H  . hydrALAZINE      . hydrALAZINE  25 mg Oral Q8H  . metoprolol tartrate  12.5 mg Oral BID  . nicotine  14 mg Transdermal Daily  . regadenoson       Continuous Infusions: PRN Meds:.acetaminophen **OR** acetaminophen, bisacodyl, hydrALAZINE, ondansetron **OR** ondansetron  (ZOFRAN) IV, polyethylene glycol, zolpidem   SUBJECTIVE: Jane Estrada is feeling better.  She is not having any more chest pain.  She tells me that she has had hypertension since she was 44 years old and that it has always been extremely difficult to control.  Review of Systems: Review of Systems  Constitutional: Positive for diaphoresis. Negative for chills, fever and weight loss.  Respiratory: Negative for cough and shortness of breath.   Cardiovascular: Negative for chest pain.    Allergies  Allergen Reactions  . Lisinopril Anaphylaxis  . Remeron [Mirtazapine] Shortness Of Breath  . Abilify [Aripiprazole] Rash    OBJECTIVE: Vitals:   03/16/20 0936 03/16/20 0938 03/16/20 1048 03/16/20 1253  BP: (!) 192/123 (!) 188/119 (!) 187/116 (!) 177/125  Pulse: 82 80 82 87  Resp:   18 17  Temp:   98.5 F (36.9 C) 98.2 F (36.8 C)  TempSrc:   Oral Oral  SpO2:   97% 100%  Weight:      Height:       Body mass index is 22.31 kg/m.  Physical Exam Constitutional:      Comments: She is resting quietly in bed.  She is pleasant and very, very talkative.  Cardiovascular:     Rate and Rhythm: Normal rate and regular rhythm.     Heart sounds: No murmur.  Pulmonary:  Effort: Pulmonary effort is normal.     Breath sounds: Normal breath sounds.  Psychiatric:        Mood and Affect: Mood normal.     Lab Results Lab Results  Component Value Date   WBC 3.1 (L) 03/15/2020   HGB 11.3 (L) 03/15/2020   HCT 35.8 (L) 03/15/2020   MCV 89.9 03/15/2020   PLT 138 (L) 03/15/2020    Lab Results  Component Value Date   CREATININE 1.03 (H) 03/15/2020   BUN 19 03/15/2020   NA 139 03/15/2020   K 4.1 03/15/2020   CL 104 03/15/2020   CO2 21 (L) 03/15/2020    Lab Results  Component Value Date   ALT 24 03/15/2020   AST 42 (H) 03/15/2020   ALKPHOS 44 03/15/2020   BILITOT 0.5 03/15/2020    CD4 T Cell Abs (/uL)  Date Value  03/15/2020 41 (L)   Microbiology: Recent Results (from the past  240 hour(s))  SARS CORONAVIRUS 2 (TAT 6-24 HRS) Nasopharyngeal Nasopharyngeal Swab     Status: None   Collection Time: 03/15/20  6:21 AM   Specimen: Nasopharyngeal Swab  Result Value Ref Range Status   SARS Coronavirus 2 NEGATIVE NEGATIVE Final    Comment: (NOTE) SARS-CoV-2 target nucleic acids are NOT DETECTED. The SARS-CoV-2 RNA is generally detectable in upper and lower respiratory specimens during the acute phase of infection. Negative results do not preclude SARS-CoV-2 infection, do not rule out co-infections with other pathogens, and should not be used as the sole basis for treatment or other patient management decisions. Negative results must be combined with clinical observations, patient history, and epidemiological information. The expected result is Negative. Fact Sheet for Patients: SugarRoll.be Fact Sheet for Healthcare Providers: https://www.woods-mathews.com/ This test is not yet approved or cleared by the [place] FDA and  has been authorized for detection and/or diagnosis of SARS-CoV-2 by FDA under an Emergency Use Authorization (EUA). This EUA will remain  in effect (meaning this test can be used) for the duration of the COVID-19 declaration under Section 56 4(b)(1) of the Act, 21 U.S.C. section 360bbb-3(b)(1), unless the authorization is terminated or revoked sooner. Performed at [facility], [place] [address], [place], [place] [address]     Michel Bickers, [place] [facility] [phone] pager   [phone] cell 03/16/2020, 1:54 PM

## 2020-03-16 NOTE — Progress Notes (Signed)
PROGRESS NOTE    Jane Estrada  [id number] DOB: [birth year] DOA: 03/15/2020 PCP: Patient, No Pcp Per    Brief Narrative:  Per the admission H&P patient is a 44 y.o. female with medical history significant of CAD; HTN; systolic CHF; bipolar d/o; tobacco dependence; polysubstance abuse; granular cell tumor of the trachea; HIV; and homelessness presented with chest pain.  She took a train from [place] to here - she was "dying in [place].  Literally."  She knew that she wouldn't last much longer, "basically trying to kill myself every day" - not taking medications and making poor life choices.  Her husband died and she "decided to honor him in death rather than treat him right while he was alive."  She started walking from [place], [place] to [facility] "and the police kept harassing me."  Someone drove her to [place], [place].  She "had a warning that I needed to continue my journey."  God told her to leave all of her stuff behind and hop a freight train.  She ended up in [place], [place].  She was yelling and screaming on a bench outside of [employer] because she left everything behind and the police came.  She was mad at God because He said he would provide for her.  An officer paid for her train ticket from [place] to here.  She has no connections.  She doesn't know where she will settle.  "The doctors wanted to keep on bombarding my body with a whole bunch of medicine."  The medicines made her feel worse.  She was supposed to be taking meds for HIV, CHF, depression but hasn't been taking them.  She developed chest pains and a headache - she got a headache not long after she got off the train.  She started walking trying to find a hospital.  She found a Engineer, structural by [facility] and told him she needed the hospital.  She walked further down the street and she asked for directions.  Finally a bystander called 911. Chest pain was on the left side of her chest.  Nothing made it worse, "it was just annoying."  It is not  completely gone, "still a little twinge every now and then."  Mild SOB.  Mild sore throat.  She reports unwillingness to take any medications other than possibly prn Lasix.  She has h/o recurrent pharyngitis with a prior granular cell tumor of the trachea; she had unremarkable laryngoscopy in 11/2019.  CD4 count in 02/2019 was 146.  Viral load 5751 in 02/2019.  Echo in 2016 with EF 50-55%.   Assessment & Plan:   Principal Problem:   HIV (human immunodeficiency virus infection) ([place]) Active Problems:   Chest pain   Tobacco dependence   History of myocardial infarction   Hypertension   Granular cell tumor   Chronic systolic CHF (congestive heart failure) (HCC)   Bipolar affective (HCC)   Polysubstance abuse (HCC)   Pancytopenia (HCC)   Methamphetamine-induced mood disorder (HCC)  Chest pain -Patient with reported h/o MI? In 2206 after cocaine use - Has h/o systolic CHF  - left-sided CP may have been exertional, poor historian. -CXR unremarkable.  -Initial cardiac HS troponin mildly elevated -Due to elevated troponin cardiology consulted. -Repeat echocardiogram as per cardiology.  If echo with wall motion abnormality or significant drop in EF then consider Lexiscan versus cath per cardiology. -Started ASA 81 mg daily -LDL 114.  TSH 0.921.  HTN -Patient acknowledges not taking medication for this issue and is  resistant to taking medications. -However, says she is willing to try?  Order Norvasc.  Metoprolol, hydralazine also ordered. -Monitor blood pressure and adjust medications as needed provided patient willing to take medications.  Chronic systolic CHF -Most recent Echo was 1 year ago with improved but persistent systolic CHF -Recheck Echo -Cardiology consulted.  Ordered hydralazine, metoprolol, Norvasc. Has anaphylaxis with ACEI. Patient has been unwilling to take medications.  Bipolar d/o -Patient appears to have a religious concern about all prescribed medications    -Her responses to questions raise concern for delusional disorder -She is at significant risk for worsening health problems if she continues to use illicit substances and continues to refuse prescribed medications -She was prescribed Seroquel in the past but it is not clear if she took this -Psychiatry consulted  Untreated HIV -Patient reports that medications made her feel worse in the past, it appears that she was prescribed Biktarvy and never actually picked it up -Recheck CD4 and viral load -ID consult requested -At a minimum, she needs to be plugged in to our system to provide her with resources if she is planning to stay in this community  Polysubstance abuse -UDS + for marijuana and amphetamines -Also with h/o cocaine use in the past -TOC team consult requested  Homelessness -Patient appears to have randomly ended up here and has no belongings or ties to the area -She will need housing/shelter assistance or at least support in some way -TOC team consult requested  Tobacco dependence -Encouraged cessation.   -Patch ordered    DVT prophylaxis: Lovenox  Code Status:  Full - confirmed with patient Family Communication: None present Disposition Plan:  patient homeless Consults called: Cardiology; Psychiatry; ID; TOC team   Antimicrobials:   None   Subjective: C/o chest pain.  Objective: Vitals:   03/16/20 1048 03/16/20 1253 03/16/20 1552 03/16/20 1718  BP: (!) 187/116 (!) 177/125 (!) 156/112 (!) 150/86  Pulse: 82 87  84  Resp: 18 17  18   Temp: 98.5 F (36.9 C) 98.2 F (36.8 C)  98.2 F (36.8 C)  TempSrc: Oral Oral  Oral  SpO2: 97% 100%  100%  Weight:      Height:       No intake or output data in the 24 hours ending 03/16/20 1855 Filed Weights   03/15/20 0421  Weight: 59 kg    Examination: General exam: Awake, oriented, not in any acute distress Respiratory system: Clear to auscultation. Respiratory effort normal. Cardiovascular system: S1 &  S2, no murmur. Gastrointestinal system: Abdomen is nondistended, soft and nontender.  Positive bowel sounds. Central nervous system: Alert and oriented. No focal neurological deficits. Extremities: Symmetric 5 x 5 power. Skin: No rashes Psychiatry: Unsure about her judgement and insight? Mood & affect appropriate.     Data Reviewed: I have personally reviewed following labs and imaging studies  CBC: Recent Labs  Lab 03/15/20 0439  WBC 3.1*  NEUTROABS 2.1  HGB 11.3*  HCT 35.8*  MCV 89.9  PLT 0000000*   Basic Metabolic Panel: Recent Labs  Lab 03/15/20 0440  NA 139  K 4.1  CL 104  CO2 21*  GLUCOSE 102*  BUN 19  CREATININE 1.03*  CALCIUM 9.7   GFR: Estimated Creatinine Clearance: 61.4 mL/min (A) (by C-G formula based on SCr of 1.03 mg/dL (H)). Liver Function Tests: Recent Labs  Lab 03/15/20 0440  AST 42*  ALT 24  ALKPHOS 44  BILITOT 0.5  PROT 7.6  ALBUMIN 3.8   No results  for input(s): LIPASE, AMYLASE in the last 168 hours. No results for input(s): AMMONIA in the last 168 hours. Coagulation Profile: No results for input(s): INR, PROTIME in the last 168 hours. Cardiac Enzymes: No results for input(s): CKTOTAL, CKMB, CKMBINDEX, TROPONINI in the last 168 hours. BNP (last 3 results) No results for input(s): PROBNP in the last 8760 hours. HbA1C: No results for input(s): HGBA1C in the last 72 hours. CBG: No results for input(s): GLUCAP in the last 168 hours. Lipid Profile: Recent Labs    03/15/20 1035  CHOL 169  HDL 46  LDLCALC 114*  TRIG 47  CHOLHDL 3.7   Thyroid Function Tests: Recent Labs    03/15/20 1234  TSH 0.921   Anemia Panel: No results for input(s): VITAMINB12, FOLATE, FERRITIN, TIBC, IRON, RETICCTPCT in the last 72 hours. Sepsis Labs: No results for input(s): PROCALCITON, LATICACIDVEN in the last 168 hours.  Recent Results (from the past 240 hour(s))  SARS CORONAVIRUS 2 (TAT 6-24 HRS) Nasopharyngeal Nasopharyngeal Swab     Status: None    Collection Time: 03/15/20  6:21 AM   Specimen: Nasopharyngeal Swab  Result Value Ref Range Status   SARS Coronavirus 2 NEGATIVE NEGATIVE Final    Comment: (NOTE) SARS-CoV-2 target nucleic acids are NOT DETECTED. The SARS-CoV-2 RNA is generally detectable in upper and lower respiratory specimens during the acute phase of infection. Negative results do not preclude SARS-CoV-2 infection, do not rule out co-infections with other pathogens, and should not be used as the sole basis for treatment or other patient management decisions. Negative results must be combined with clinical observations, patient history, and epidemiological information. The expected result is Negative. Fact Sheet for Patients: SugarRoll.be Fact Sheet for Healthcare Providers: https://www.woods-mathews.com/ This test is not yet approved or cleared by the [place] FDA and  has been authorized for detection and/or diagnosis of SARS-CoV-2 by FDA under an Emergency Use Authorization (EUA). This EUA will remain  in effect (meaning this test can be used) for the duration of the COVID-19 declaration under Section 56 4(b)(1) of the Act, 21 U.S.C. section 360bbb-3(b)(1), unless the authorization is terminated or revoked sooner. Performed at [facility], [place] [address] [place], [place] [address]          Radiology Studies: DG Chest 2 View  Result Date: 03/15/2020 CLINICAL DATA:  Chest pain and lethargy. EXAM: CHEST - 2 VIEW COMPARISON:  None. FINDINGS: The heart size and mediastinal contours are within normal limits. Both lungs are clear. The visualized skeletal structures are unremarkable. IMPRESSION: Normal chest x-ray. Electronically Signed   By: Marijo Sanes M.D.   On: 03/15/2020 05:25   NM Myocar Multi W/Spect W/Wall Motion / EF  Result Date: 03/16/2020  There was no ST segment deviation noted during stress.  Nuclear stress EF: 33%. The left ventricular  ejection fraction is moderately decreased (30-44%).  There is no evidence of ischemia or infarction  This is a high risk study base on low EF.    ECHOCARDIOGRAM COMPLETE  Result Date: 03/15/2020    ECHOCARDIOGRAM REPORT   Patient Name:   TYCIE NANCE Date of Exam: 03/15/2020 Medical Rec #:  [id number]     Height:       64.0 in Accession #:    [id number]    Weight:       130.0 lb Date of Birth:  11/24/1976    BSA:          1.629 m Patient Age:    71  years      BP:           157/109 mmHg Patient Gender: F             HR:           68 bpm. Exam Location:  Inpatient Procedure: 2D Echo, Cardiac Doppler and Color Doppler Indications:    I50.9* Heart failure (unspecified)  History:        Patient has no prior history of Echocardiogram examinations.                 CHF, Previous Myocardial Infarction, Signs/Symptoms:Chest Pain;                 Risk Factors:Current Smoker. Poly substance abuse.  Sonographer:    Roseanna Rainbow RDCS Referring Phys: [place]  1. Left ventricular ejection fraction, by estimation, is 45 to 50%. The left ventricle has mildly decreased function. The left ventricle demonstrates global hypokinesis. There is moderate concentric left ventricular hypertrophy. Left ventricular diastolic parameters are consistent with Grade I diastolic dysfunction (impaired relaxation).  2. Right ventricular systolic function is normal. The right ventricular size is normal. Tricuspid regurgitation signal is inadequate for assessing PA pressure.  3. The mitral valve is grossly normal. No evidence of mitral valve regurgitation. No evidence of mitral stenosis.  4. The aortic valve is tricuspid. Aortic valve regurgitation is not visualized. No aortic stenosis is present.  5. The inferior vena cava is normal in size with greater than 50% respiratory variability, suggesting right atrial pressure of 3 mmHg. FINDINGS  Left Ventricle: Left ventricular ejection fraction, by estimation, is 45 to 50%. The left  ventricle has mildly decreased function. The left ventricle demonstrates global hypokinesis. The left ventricular internal cavity size was normal in size. There is  moderate concentric left ventricular hypertrophy. Left ventricular diastolic parameters are consistent with Grade I diastolic dysfunction (impaired relaxation). Right Ventricle: The right ventricular size is normal. No increase in right ventricular wall thickness. Right ventricular systolic function is normal. Tricuspid regurgitation signal is inadequate for assessing PA pressure. Left Atrium: Left atrial size was normal in size. Right Atrium: Right atrial size was normal in size. Pericardium: There is no evidence of pericardial effusion. Presence of pericardial fat pad. Mitral Valve: The mitral valve is grossly normal. No evidence of mitral valve regurgitation. No evidence of mitral valve stenosis. Tricuspid Valve: The tricuspid valve is grossly normal. Tricuspid valve regurgitation is trivial. No evidence of tricuspid stenosis. Aortic Valve: The aortic valve is tricuspid. Aortic valve regurgitation is not visualized. No aortic stenosis is present. Pulmonic Valve: The pulmonic valve was grossly normal. Pulmonic valve regurgitation is not visualized. No evidence of pulmonic stenosis. Aorta: The aortic root is normal in size and structure. Venous: The inferior vena cava is normal in size with greater than 50% respiratory variability, suggesting right atrial pressure of 3 mmHg. IAS/Shunts: The atrial septum is grossly normal.  LEFT VENTRICLE PLAX 2D LVIDd:         5.20 cm      Diastology LVIDs:         4.20 cm      LV e' lateral:   9.36 cm/s LV PW:         1.80 cm      LV E/e' lateral: 10.1 LV IVS:        1.50 cm      LV e' medial:    6.96 cm/s LVOT diam:  2.20 cm      LV E/e' medial:  13.6 LV SV:         69 LV SV Index:   42 LVOT Area:     3.80 cm  LV Volumes (MOD) LV vol d, MOD A2C: 130.0 ml LV vol d, MOD A4C: 132.0 ml LV vol s, MOD A2C: 64.1 ml LV  vol s, MOD A4C: 87.9 ml LV SV MOD A2C:     65.9 ml LV SV MOD A4C:     132.0 ml LV SV MOD BP:      58.9 ml RIGHT VENTRICLE             IVC RV S prime:     14.90 cm/s  IVC diam: 1.30 cm TAPSE (M-mode): 2.6 cm LEFT ATRIUM             Index       RIGHT ATRIUM           Index LA diam:        3.30 cm 2.03 cm/m  RA Area:     17.80 cm LA Vol (A2C):   42.3 ml 25.97 ml/m RA Volume:   47.70 ml  29.28 ml/m LA Vol (A4C):   38.1 ml 23.39 ml/m LA Biplane Vol: 42.2 ml 25.90 ml/m  AORTIC VALVE LVOT Vmax:   108.00 cm/s LVOT Vmean:  71.200 cm/s LVOT VTI:    0.182 m  AORTA Ao Root diam: 3.10 cm MITRAL VALVE MV Area (PHT): 4.21 cm    SHUNTS MV Decel Time: 180 msec    Systemic VTI:  0.18 m MV E velocity: 94.60 cm/s  Systemic Diam: 2.20 cm MV A velocity: 89.20 cm/s MV E/A ratio:  1.06 Eleonore Chiquito MD Electronically signed by Eleonore Chiquito MD Signature Date/Time: 03/15/2020/4:17:02 PM    Final         Scheduled Meds: . Derrill Memo ON 03/17/2020] amLODipine  10 mg Oral Daily  . aspirin EC  81 mg Oral Daily  . docusate sodium  100 mg Oral BID  . enoxaparin (LOVENOX) injection  40 mg Subcutaneous Q24H  . hydrALAZINE      . hydrALAZINE  25 mg Oral Q8H  . metoprolol tartrate  12.5 mg Oral BID  . nicotine  14 mg Transdermal Daily  . regadenoson       Continuous Infusions:   LOS: 0 days    Yaakov Guthrie, MD [facility]   To contact the attending provider between 7A-7P or the covering provider during after hours 7P-7A, please log into the web site [email or web address] and access using universal [place] password for that web site. If you do not have the password, please call the hospital operator.  03/16/2020, 6:55 PM

## 2020-03-16 NOTE — TOC Initial Note (Signed)
Transition of Care [facility]) - Initial/Assessment Note    Patient Details  Name: Jane Estrada MRN: [id number] Date of Birth: [birth year]  [facility]) CM/SW Contact:    Jane Mt, LCSW Phone Number: 03/16/2020, 2:31 PM  Clinical Narrative:                 CSW spoke with pt at bedside. Introduced self, role, reason for visit. When asked how she is doing she states she is in "disbelief, happy and sad," she "can't believe she made it to [place]." She then begins to tell this Probation officer about how she came to be in [place]. She details hospital stays, homelessness were she was staying in a tent camp, and that she heard a voice that she attributes to god telling her to get on a passing freight train. She then had several stops before arriving in [place]. She does not know anyone in [place] except a pastor at [employer] (she then clarifies that she doesn't actually know him personally). Pt very focused on that god will provide for her and that he will lead her where she is supposed to be.   She does not have a cell phone or debit card as she left those at the tent in [place]. Pt confirmed home address listed is her sisters address and her sister Jane Estrada is the correct contact. Pt understands that we cannot provide permanent housing straight from the hospital. She is willing to f/u at [facility]- she knows that there is a computer lab there and she can get further case management there. Noted that pt does have belongings in the room. Pt accepting of IRC/housing/shelter resources and is amenable to voucher to get to [facility] when pt cleared for d/c. She is open to f/u provider being added for care at discharge. She is also amenable to [facility] to fill medications at discharge.   TOC team remains available for assistance w/ transportation to shelter when pt medically cleared. [facility] for hospital f/u so CSW will place [facility] NP [place] on summary for f/u. Pt also has scheduled  ID visit and this was placed on f/u providers.   Expected Discharge Plan: Skilled Nursing Facility Barriers to Discharge: Continued Medical Work up, Homeless with medical needs   Patient Goals and CMS Choice Patient states their goals for this hospitalization and ongoing recovery are:: wherever god leads her   Choice offered to / list presented to : Patient  Expected Discharge Plan and Services Expected Discharge Plan: [place] In-house Referral: Clinical Social Work, PCP / Curator Services: CM Consult, Follow-up appt scheduled, Medication Assistance, [facility] Post Acute Care Choice: [facility]) Living arrangements for the past 2 months: Homeless, No permanent address  Prior Living Arrangements/Services Living arrangements for the past 2 months: Homeless, No permanent address Lives with:: Other (Comment)(homeless) Patient language and need for interpreter reviewed:: Yes(no needs) Do you feel safe going back to the place where you live?: Yes      Need for Family Participation in Patient Care: Yes (Comment)(assistance w/ housing etc.) Care giver support system in place?: No (comment)(pt does not have + relationship with her sister, states she is putting her faith in god)   Criminal Activity/Legal Involvement Pertinent to Current Situation/Hospitalization: No - Comment as needed  Activities of Daily Living Home Assistive Devices/Equipment: None ADL Screening (condition at time of admission) Patient's cognitive ability adequate to safely complete daily activities?: Yes Is  the patient deaf or have difficulty hearing?: No Does the patient have difficulty seeing, even when wearing glasses/contacts?: No Does the patient have difficulty concentrating, remembering, or making decisions?: No Patient able to express need for assistance with ADLs?: Yes Does the patient have difficulty dressing or bathing?: No Independently performs ADLs?: Yes  (appropriate for developmental age) Does the patient have difficulty walking or climbing stairs?: No Weakness of Legs: None Weakness of Arms/Hands: None  Permission Sought/Granted Permission sought to share information with : PCP Permission granted to share information with : Yes, Verbal Permission Granted     Permission granted to share info w AGENCY: [facility]    Emotional Assessment Appearance:: Appears stated age Attitude/Demeanor/Rapport: Engaged, Self-Confident Affect (typically observed): Accepting, Adaptable Orientation: : Oriented to Self, Oriented to Place, Oriented to  Time, Oriented to Situation Alcohol / Substance Use: Illicit Drugs Psych Involvement: No (comment)(does not consistently f/u w providers or take medications)  Admission diagnosis:  Elevated troponin [R77.8] Essential hypertension [I10] Chest pain [R07.9] Chest pain in adult [R07.9] HIV infection, unspecified symptom status ([place]) [B20] Patient Active Problem List   Diagnosis Date Noted  . Chest pain 03/15/2020  . Polysubstance abuse ([place]) 03/15/2020  . Pancytopenia ([place]) 03/15/2020  . Methamphetamine-induced mood disorder ([place]) 03/15/2020  . Tobacco dependence   . Hypertension   . HIV (human immunodeficiency virus infection) ([place])   . Granular cell tumor   . Chronic systolic CHF (congestive heart failure) ([place])   . Bipolar affective ([place])   . History of myocardial infarction 2006   PCP:  Patient, No Pcp Per Pharmacy:   [id number] - [place], [place] [id number] [place] [id number] Phone: [phone] Fax: [phone]  [facility] [place] [place] [address] [place] [address] Phone: [phone] Fax: [phone]    Readmission Risk Interventions No flowsheet data found.

## 2020-03-16 NOTE — Progress Notes (Signed)
Myovue shows no ischemia Chest pain was atypical EF 45-50% by echo  Continue hydralazine, norvasc and lopressor No ACE/ARB due to history of anaphylaxis  Cardiology will sign off   Jenkins Rouge MD [facility]

## 2020-03-16 NOTE — Progress Notes (Signed)
Subjective:  Atypical pains   Objective:  Vitals:   03/15/20 1405 03/15/20 1852 03/15/20 2338 03/16/20 0500  BP: (!) 157/109 136/81 (!) 158/97 (!) 157/117  Pulse: 89 80 81 71  Resp: 18 18 18 18   Temp: 98 F (36.7 C) 98.2 F (36.8 C) 98.1 F (36.7 C) 98.4 F (36.9 C)  TempSrc: Oral Oral Oral Oral  SpO2: 100% 100% 100% 100%  Weight:      Height:        Intake/Output from previous day: No intake or output data in the 24 hours ending 03/16/20 [id number]  Physical Exam: Affect appropriate Healthy:  appears stated age HEENT: normal Neck supple with no adenopathy JVP normal no bruits no thyromegaly Lungs clear with no wheezing and good diaphragmatic motion Heart:  S1/S2 no murmur, no rub, gallop or click PMI normal Abdomen: benighn, BS positve, no tenderness, no AAA no bruit.  No HSM or HJR Distal pulses intact with no bruits No edema Neuro non-focal Skin warm and dry No muscular weakness   Lab Results: Basic Metabolic Panel: Recent Labs    03/15/20 0440  NA 139  K 4.1  CL 104  CO2 21*  GLUCOSE 102*  BUN 19  CREATININE 1.03*  CALCIUM 9.7   Liver Function Tests: Recent Labs    03/15/20 0440  AST 42*  ALT 24  ALKPHOS 44  BILITOT 0.5  PROT 7.6  ALBUMIN 3.8   No results for input(s): LIPASE, AMYLASE in the last 72 hours. CBC: Recent Labs    03/15/20 0439  WBC 3.1*  NEUTROABS 2.1  HGB 11.3*  HCT 35.8*  MCV 89.9  PLT 138*   Fasting Lipid Panel: Recent Labs    03/15/20 1035  CHOL 169  HDL 46  LDLCALC 114*  TRIG 47  CHOLHDL 3.7   Thyroid Function Tests: Recent Labs    03/15/20 1234  TSH 0.921    Imaging: DG Chest 2 View  Result Date: 03/15/2020 CLINICAL DATA:  Chest pain and lethargy. EXAM: CHEST - 2 VIEW COMPARISON:  None. FINDINGS: The heart size and mediastinal contours are within normal limits. Both lungs are clear. The visualized skeletal structures are unremarkable. IMPRESSION: Normal chest x-ray. Electronically Signed   By: Marijo Sanes M.D.   On: 03/15/2020 05:25   ECHOCARDIOGRAM COMPLETE  Result Date: 03/15/2020    ECHOCARDIOGRAM REPORT   Patient Name:   Jane Estrada Date of Exam: 03/15/2020 Medical Rec #:  [id number]     Height:       64.0 in Accession #:    [id number]    Weight:       130.0 lb Date of Birth:  1976/08/16    BSA:          1.629 m Patient Age:    44 years      BP:           157/109 mmHg Patient Gender: F             HR:           68 bpm. Exam Location:  Inpatient Procedure: 2D Echo, Cardiac Doppler and Color Doppler Indications:    I50.9* Heart failure (unspecified)  History:        Patient has no prior history of Echocardiogram examinations.                 CHF, Previous Myocardial Infarction, Signs/Symptoms:Chest Pain;  Risk Factors:Current Smoker. Poly substance abuse.  Sonographer:    Roseanna Rainbow RDCS Referring Phys: [place]  1. Left ventricular ejection fraction, by estimation, is 45 to 50%. The left ventricle has mildly decreased function. The left ventricle demonstrates global hypokinesis. There is moderate concentric left ventricular hypertrophy. Left ventricular diastolic parameters are consistent with Grade I diastolic dysfunction (impaired relaxation).  2. Right ventricular systolic function is normal. The right ventricular size is normal. Tricuspid regurgitation signal is inadequate for assessing PA pressure.  3. The mitral valve is grossly normal. No evidence of mitral valve regurgitation. No evidence of mitral stenosis.  4. The aortic valve is tricuspid. Aortic valve regurgitation is not visualized. No aortic stenosis is present.  5. The inferior vena cava is normal in size with greater than 50% respiratory variability, suggesting right atrial pressure of 3 mmHg. FINDINGS  Left Ventricle: Left ventricular ejection fraction, by estimation, is 45 to 50%. The left ventricle has mildly decreased function. The left ventricle demonstrates global hypokinesis. The left  ventricular internal cavity size was normal in size. There is  moderate concentric left ventricular hypertrophy. Left ventricular diastolic parameters are consistent with Grade I diastolic dysfunction (impaired relaxation). Right Ventricle: The right ventricular size is normal. No increase in right ventricular wall thickness. Right ventricular systolic function is normal. Tricuspid regurgitation signal is inadequate for assessing PA pressure. Left Atrium: Left atrial size was normal in size. Right Atrium: Right atrial size was normal in size. Pericardium: There is no evidence of pericardial effusion. Presence of pericardial fat pad. Mitral Valve: The mitral valve is grossly normal. No evidence of mitral valve regurgitation. No evidence of mitral valve stenosis. Tricuspid Valve: The tricuspid valve is grossly normal. Tricuspid valve regurgitation is trivial. No evidence of tricuspid stenosis. Aortic Valve: The aortic valve is tricuspid. Aortic valve regurgitation is not visualized. No aortic stenosis is present. Pulmonic Valve: The pulmonic valve was grossly normal. Pulmonic valve regurgitation is not visualized. No evidence of pulmonic stenosis. Aorta: The aortic root is normal in size and structure. Venous: The inferior vena cava is normal in size with greater than 50% respiratory variability, suggesting right atrial pressure of 3 mmHg. IAS/Shunts: The atrial septum is grossly normal.  LEFT VENTRICLE PLAX 2D LVIDd:         5.20 cm      Diastology LVIDs:         4.20 cm      LV e' lateral:   9.36 cm/s LV PW:         1.80 cm      LV E/e' lateral: 10.1 LV IVS:        1.50 cm      LV e' medial:    6.96 cm/s LVOT diam:     2.20 cm      LV E/e' medial:  13.6 LV SV:         69 LV SV Index:   42 LVOT Area:     3.80 cm  LV Volumes (MOD) LV vol d, MOD A2C: 130.0 ml LV vol d, MOD A4C: 132.0 ml LV vol s, MOD A2C: 64.1 ml LV vol s, MOD A4C: 87.9 ml LV SV MOD A2C:     65.9 ml LV SV MOD A4C:     132.0 ml LV SV MOD BP:      58.9  ml RIGHT VENTRICLE             IVC RV S prime:  14.90 cm/s  IVC diam: 1.30 cm TAPSE (M-mode): 2.6 cm LEFT ATRIUM             Index       RIGHT ATRIUM           Index LA diam:        3.30 cm 2.03 cm/m  RA Area:     17.80 cm LA Vol (A2C):   42.3 ml 25.97 ml/m RA Volume:   47.70 ml  29.28 ml/m LA Vol (A4C):   38.1 ml 23.39 ml/m LA Biplane Vol: 42.2 ml 25.90 ml/m  AORTIC VALVE LVOT Vmax:   108.00 cm/s LVOT Vmean:  71.200 cm/s LVOT VTI:    0.182 m  AORTA Ao Root diam: 3.10 cm MITRAL VALVE MV Area (PHT): 4.21 cm    SHUNTS MV Decel Time: 180 msec    Systemic VTI:  0.18 m MV E velocity: 94.60 cm/s  Systemic Diam: 2.20 cm MV A velocity: 89.20 cm/s MV E/A ratio:  1.06 Eleonore Chiquito MD Electronically signed by Eleonore Chiquito MD Signature Date/Time: 03/15/2020/4:17:02 PM    Final     Cardiac Studies:  ECG: SR nonspecific ST changes    Telemetry:  NSR   Echo: EF 45-50% moderate LVH global hypokinesis   Medications:    amLODipine  5 mg Oral Daily   aspirin EC  81 mg Oral Daily   docusate sodium  100 mg Oral BID   enoxaparin (LOVENOX) injection  40 mg Subcutaneous Q24H   nicotine  14 mg Transdermal Daily      Assessment/Plan:   1. Chest pain :  Atypical nonspecific ECG changes history of cocaine induced MI Echo with mildly decreased EF global hypokinesis with moderate LVH for Lexiscan myovue today Troponin up max 51-34-30 no trend and atypical pain   2. HTN:  Non compliant with Rx increase norvasc to 10 mg dialy  3. Bipolar:  Significant issue does not want to be on Rx makes her feel worse psychiatry has seen   4. HIV:  Seen by ID CD4 T cells quite low 41  Does not want to comply with Rx    Jenkins Rouge 03/16/2020, 8:29 AM

## 2020-03-16 NOTE — Progress Notes (Signed)
Gave report to Nuclear med for stress test. Pt has been NPO entire night.

## 2020-03-17 DIAGNOSIS — F1910 Other psychoactive substance abuse, uncomplicated: Secondary | ICD-10-CM

## 2020-03-17 DIAGNOSIS — B20 Human immunodeficiency virus [HIV] disease: Secondary | ICD-10-CM

## 2020-03-17 LAB — CBC
HCT: 34.3 % — ABNORMAL LOW (ref 36.0–46.0)
Hemoglobin: 10.9 g/dL — ABNORMAL LOW (ref 12.0–15.0)
MCH: 28.8 pg (ref 26.0–34.0)
MCHC: 31.8 g/dL (ref 30.0–36.0)
MCV: 90.5 fL (ref 80.0–100.0)
Platelets: 127 10*3/uL — ABNORMAL LOW (ref 150–400)
RBC: 3.79 MIL/uL — ABNORMAL LOW (ref 3.87–5.11)
RDW: 15.4 % (ref 11.5–15.5)
WBC: 1.5 10*3/uL — ABNORMAL LOW (ref 4.0–10.5)
nRBC: 0 % (ref 0.0–0.2)

## 2020-03-17 LAB — BASIC METABOLIC PANEL
Anion gap: 9 (ref 5–15)
BUN: 14 mg/dL (ref 6–20)
CO2: 22 mmol/L (ref 22–32)
Calcium: 8.9 mg/dL (ref 8.9–10.3)
Chloride: 109 mmol/L (ref 98–111)
Creatinine, Ser: 0.79 mg/dL (ref 0.44–1.00)
GFR calc Af Amer: >60 mL/min (ref >60)
GFR calc non Af Amer: >60 mL/min (ref >60)
Glucose, Bld: 103 mg/dL — ABNORMAL HIGH (ref 70–99)
Potassium: 4.1 mmol/L (ref 3.5–5.1)
Sodium: 140 mmol/L (ref 135–145)

## 2020-03-17 MED ORDER — ASPIRIN 81 MG PO TBEC: 81.0000 mg | DELAYED_RELEASE_TABLET | Freq: Every day | ORAL | Status: DC

## 2020-03-17 MED ORDER — HYDRALAZINE HCL 25 MG PO TABS: 25.0000 mg | ORAL_TABLET | Freq: Three times a day (TID) | ORAL | 0 refills | Status: DC

## 2020-03-17 MED ORDER — METOPROLOL TARTRATE 25 MG PO TABS: 12.5000 mg | ORAL_TABLET | Freq: Two times a day (BID) | ORAL | 0 refills | Status: DC

## 2020-03-17 MED ORDER — AMLODIPINE BESYLATE 10 MG PO TABS: 10.0000 mg | ORAL_TABLET | Freq: Every day | ORAL | 0 refills | Status: DC

## 2020-03-17 MED FILL — hydrALAZINE HCL 25 MG TABS: 25 | 30 days supply | Qty: 90 | Fill #0

## 2020-03-17 MED FILL — AMLODIPINE BESYLATE 10 MG T: 10 | 30 days supply | Qty: 30 | Fill #0

## 2020-03-17 MED FILL — METOPROLOL TARTRATE 25 MG T: 25 | 30 days supply | Qty: 30 | Fill #0

## 2020-03-17 NOTE — Discharge Summary (Signed)
Physician Discharge Summary  Jane Estrada [id number] DOB: [birth year] DOA: 03/15/2020  PCP: Patient, No Pcp Per  Admit date: 03/15/2020 Discharge date: 03/17/2020  Admitted From: Homeless Discharge disposition: Homeless   Recommendations for Outpatient Follow-Up:   Will need close outpatient follow-up with infectious disease as well as a PCP  Discharge Diagnosis:   Principal Problem:   HIV (human immunodeficiency virus infection) ([place]) Active Problems:   Chest pain   Tobacco dependence   History of myocardial infarction   Hypertension   Granular cell tumor   Chronic systolic CHF (congestive heart failure) ([place])   Bipolar affective ([place])   Polysubstance abuse ([place])   Pancytopenia ([place])   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  Diet recommendation: Low sodium, heart healthy  Wound care: None.  Code status: Full.   History of Present Illness:   Jane Estrada is a 44 y.o. female with medical history significant of CAD; HTN; systolic CHF; bipolar d/o; tobacco dependence; polysubstance abuse; granular cell tumor of the trachea; HIV; and homelessness presenting with chest pain.  She took a train from [place] to here - she was "dying in [place].  Literally."  She knew that she wouldn't last much longer, "basically trying to kill myself every day" - not taking medications and making poor life choices.  Her husband died and she "decided to honor him in death rather than treat him right while he was alive."  She started walking from [place], [place] to [facility] "and the police kept harassing me."  Someone drove her to [place], [place].  She "had a warning that I needed to continue my journey."  God told her to leave all of her stuff behind and hop a freight train.  She ended up in [place], [place].  She was yelling and screaming on a bench outside of [employer] because she left everything behind and the police came.  She was mad at God because He  said He would provide for her.  An officer paid for her train ticket from [place] to here.  She has no connections.  She doesn't know where she will settle.  "The doctors wanted to keep on bombarding my body with a whole bunch of medicine."  The medicines made her feel worse.  She was supposed to be taking meds for HIV, CHF, depression...  She developed chest pains and a headache - she got a headache not long after she got off the train.  She started walking trying to find the hospital.  She found a Engineer, structural by [facility] and told him she needed the hospital.  She walked further down the street and she asked for directions.  Finally a bystander called 911.  The chest pain was on the left side of her chest.  Nothing made it worse, "it was just annoying."  It is not completely gone, "still a little twinge every now and then."  Mild SOB.  Mild sore throat.  She reports unwillingness to take any medications other than possibly prn Lasix.    Hospital Course by Problem:   Chest pain/HTN/Chronic systolic CHF Myovue shows no ischemia Chest pain was atypical EF 45-50% by echo  Continue hydralazine, norvasc and lopressor No ACE/ARB due to history of anaphylaxis -Cardiology signed off  Bipolar d/o -Seen by psychiatry -Outpatient follow-up recommended  Untreated HIV -Patient reports that medications made her feel worse in the past, it appears that she was prescribed Biktarvy and never actually picked it up -  Patient to follow-up outpatient with infectious disease if remains in the area  Polysubstance abuse -UDS + for marijuana and amphetamines -Also with h/o cocaine use in the past -Encourage cessation  Homelessness -Provided resources by [facility]  Tobacco dependence -Encouraged cessation.      Medical Consultants:   Psych ID Cardiology   Discharge Exam:   Vitals:   03/17/20 0038 03/17/20 0503  BP: (!) 137/109 (!) 140/106  Pulse: 78 69  Resp: 18 18  Temp: 98.4 F (36.9 C)  98.6 F (37 C)  SpO2: 100% 100%   Vitals:   03/16/20 1718 03/16/20 2057 03/17/20 0038 03/17/20 0503  BP: (!) 150/86 (!) 144/92 (!) 137/109 (!) 140/106  Pulse: 84 81 78 69  Resp: 18  18 18   Temp: 98.2 F (36.8 C)  98.4 F (36.9 C) 98.6 F (37 C)  TempSrc: Oral  Oral Oral  SpO2: 100%  100% 100%  Weight:      Height:        General exam: Appears calm and comfortable.   The results of significant diagnostics from this hospitalization (including imaging, microbiology, ancillary and laboratory) are listed below for reference.     Procedures and Diagnostic Studies:   DG Chest 2 View  Result Date: 03/15/2020 CLINICAL DATA:  Chest pain and lethargy. EXAM: CHEST - 2 VIEW COMPARISON:  None. FINDINGS: The heart size and mediastinal contours are within normal limits. Both lungs are clear. The visualized skeletal structures are unremarkable. IMPRESSION: Normal chest x-ray. Electronically Signed   By: Marijo Sanes M.D.   On: 03/15/2020 05:25   NM Myocar Multi W/Spect W/Wall Motion / EF  Result Date: 03/16/2020  There was no ST segment deviation noted during stress.  Nuclear stress EF: 33%. The left ventricular ejection fraction is moderately decreased (30-44%).  There is no evidence of ischemia or infarction  This is a high risk study base on low EF.    ECHOCARDIOGRAM COMPLETE  Result Date: 03/15/2020    ECHOCARDIOGRAM REPORT   Patient Name:   Jane Estrada Date of Exam: 03/15/2020 Medical Rec #:  [id number]     Height:       64.0 in Accession #:    [id number]    Weight:       130.0 lb Date of Birth:  Nov 24, 1976    BSA:          1.629 m Patient Age:    22 years      BP:           157/109 mmHg Patient Gender: F             HR:           68 bpm. Exam Location:  Inpatient Procedure: 2D Echo, Cardiac Doppler and Color Doppler Indications:    I50.9* Heart failure (unspecified)  History:        Patient has no prior history of Echocardiogram examinations.                 CHF, Previous Myocardial  Infarction, Signs/Symptoms:Chest Pain;                 Risk Factors:Current Smoker. Poly substance abuse.  Sonographer:    Roseanna Rainbow RDCS Referring Phys: [place]  1. Left ventricular ejection fraction, by estimation, is 45 to 50%. The left ventricle has mildly decreased function. The left ventricle demonstrates global hypokinesis. There is moderate concentric left ventricular hypertrophy. Left ventricular diastolic parameters are consistent with  Grade I diastolic dysfunction (impaired relaxation).  2. Right ventricular systolic function is normal. The right ventricular size is normal. Tricuspid regurgitation signal is inadequate for assessing PA pressure.  3. The mitral valve is grossly normal. No evidence of mitral valve regurgitation. No evidence of mitral stenosis.  4. The aortic valve is tricuspid. Aortic valve regurgitation is not visualized. No aortic stenosis is present.  5. The inferior vena cava is normal in size with greater than 50% respiratory variability, suggesting right atrial pressure of 3 mmHg. FINDINGS  Left Ventricle: Left ventricular ejection fraction, by estimation, is 45 to 50%. The left ventricle has mildly decreased function. The left ventricle demonstrates global hypokinesis. The left ventricular internal cavity size was normal in size. There is  moderate concentric left ventricular hypertrophy. Left ventricular diastolic parameters are consistent with Grade I diastolic dysfunction (impaired relaxation). Right Ventricle: The right ventricular size is normal. No increase in right ventricular wall thickness. Right ventricular systolic function is normal. Tricuspid regurgitation signal is inadequate for assessing PA pressure. Left Atrium: Left atrial size was normal in size. Right Atrium: Right atrial size was normal in size. Pericardium: There is no evidence of pericardial effusion. Presence of pericardial fat pad. Mitral Valve: The mitral valve is grossly normal. No  evidence of mitral valve regurgitation. No evidence of mitral valve stenosis. Tricuspid Valve: The tricuspid valve is grossly normal. Tricuspid valve regurgitation is trivial. No evidence of tricuspid stenosis. Aortic Valve: The aortic valve is tricuspid. Aortic valve regurgitation is not visualized. No aortic stenosis is present. Pulmonic Valve: The pulmonic valve was grossly normal. Pulmonic valve regurgitation is not visualized. No evidence of pulmonic stenosis. Aorta: The aortic root is normal in size and structure. Venous: The inferior vena cava is normal in size with greater than 50% respiratory variability, suggesting right atrial pressure of 3 mmHg. IAS/Shunts: The atrial septum is grossly normal.  LEFT VENTRICLE PLAX 2D LVIDd:         5.20 cm      Diastology LVIDs:         4.20 cm      LV e' lateral:   9.36 cm/s LV PW:         1.80 cm      LV E/e' lateral: 10.1 LV IVS:        1.50 cm      LV e' medial:    6.96 cm/s LVOT diam:     2.20 cm      LV E/e' medial:  13.6 LV SV:         69 LV SV Index:   42 LVOT Area:     3.80 cm  LV Volumes (MOD) LV vol d, MOD A2C: 130.0 ml LV vol d, MOD A4C: 132.0 ml LV vol s, MOD A2C: 64.1 ml LV vol s, MOD A4C: 87.9 ml LV SV MOD A2C:     65.9 ml LV SV MOD A4C:     132.0 ml LV SV MOD BP:      58.9 ml RIGHT VENTRICLE             IVC RV S prime:     14.90 cm/s  IVC diam: 1.30 cm TAPSE (M-mode): 2.6 cm LEFT ATRIUM             Index       RIGHT ATRIUM           Index LA diam:        3.30 cm 2.03 cm/m  RA Area:     17.80 cm LA Vol (A2C):   42.3 ml 25.97 ml/m RA Volume:   47.70 ml  29.28 ml/m LA Vol (A4C):   38.1 ml 23.39 ml/m LA Biplane Vol: 42.2 ml 25.90 ml/m  AORTIC VALVE LVOT Vmax:   108.00 cm/s LVOT Vmean:  71.200 cm/s LVOT VTI:    0.182 m  AORTA Ao Root diam: 3.10 cm MITRAL VALVE MV Area (PHT): 4.21 cm    SHUNTS MV Decel Time: 180 msec    Systemic VTI:  0.18 m MV E velocity: 94.60 cm/s  Systemic Diam: 2.20 cm MV A velocity: 89.20 cm/s MV E/A ratio:  1.06 Eleonore Chiquito MD  Electronically signed by Eleonore Chiquito MD Signature Date/Time: 03/15/2020/4:17:02 PM    Final      Labs:   Basic Metabolic Panel: Recent Labs  Lab 03/15/20 0440 03/17/20 0521  NA 139 140  K 4.1 4.1  CL 104 109  CO2 21* 22  GLUCOSE 102* 103*  BUN 19 14  CREATININE 1.03* 0.79  CALCIUM 9.7 8.9   GFR Estimated Creatinine Clearance: 79.1 mL/min (by C-G formula based on SCr of 0.79 mg/dL). Liver Function Tests: Recent Labs  Lab 03/15/20 0440  AST 42*  ALT 24  ALKPHOS 44  BILITOT 0.5  PROT 7.6  ALBUMIN 3.8   No results for input(s): LIPASE, AMYLASE in the last 168 hours. No results for input(s): AMMONIA in the last 168 hours. Coagulation profile No results for input(s): INR, PROTIME in the last 168 hours.  CBC: Recent Labs  Lab 03/15/20 0439 03/17/20 0521  WBC 3.1* PENDING  NEUTROABS 2.1  --   HGB 11.3* 10.9*  HCT 35.8* 34.3*  MCV 89.9 90.5  PLT 138* 127*   Cardiac Enzymes: No results for input(s): CKTOTAL, CKMB, CKMBINDEX, TROPONINI in the last 168 hours. BNP: Invalid input(s): POCBNP CBG: No results for input(s): GLUCAP in the last 168 hours. D-Dimer No results for input(s): DDIMER in the last 72 hours. Hgb A1c No results for input(s): HGBA1C in the last 72 hours. Lipid Profile Recent Labs    03/15/20 1035  CHOL 169  HDL 46  LDLCALC 114*  TRIG 47  CHOLHDL 3.7   Thyroid function studies Recent Labs    03/15/20 1234  TSH 0.921   Anemia work up No results for input(s): VITAMINB12, FOLATE, FERRITIN, TIBC, IRON, RETICCTPCT in the last 72 hours. Microbiology Recent Results (from the past 240 hour(s))  SARS CORONAVIRUS 2 (TAT 6-24 HRS) Nasopharyngeal Nasopharyngeal Swab     Status: None   Collection Time: 03/15/20  6:21 AM   Specimen: Nasopharyngeal Swab  Result Value Ref Range Status   SARS Coronavirus 2 NEGATIVE NEGATIVE Final    Comment: (NOTE) SARS-CoV-2 target nucleic acids are NOT DETECTED. The SARS-CoV-2 RNA is generally detectable in  upper and lower respiratory specimens during the acute phase of infection. Negative results do not preclude SARS-CoV-2 infection, do not rule out co-infections with other pathogens, and should not be used as the sole basis for treatment or other patient management decisions. Negative results must be combined with clinical observations, patient history, and epidemiological information. The expected result is Negative. Fact Sheet for Patients: SugarRoll.be Fact Sheet for Healthcare Providers: https://www.woods-mathews.com/ This test is not yet approved or cleared by the [place] FDA and  has been authorized for detection and/or diagnosis of SARS-CoV-2 by FDA under an Emergency Use Authorization (EUA). This EUA will remain  in effect (meaning this test can be used)  for the duration of the COVID-19 declaration under Section 56 4(b)(1) of the Act, 21 U.S.C. section 360bbb-3(b)(1), unless the authorization is terminated or revoked sooner. Performed at [facility], [place] [address] [place], [place] [address]      Discharge Instructions:   Discharge Instructions    Diet - low sodium heart healthy   Complete by: As directed    Increase activity slowly   Complete by: As directed      Allergies as of 03/17/2020      Reactions   Lisinopril Anaphylaxis   Remeron [mirtazapine] Shortness Of Breath   Abilify [aripiprazole] Rash      Medication List    STOP taking these medications   Biktarvy 50-200-25 MG Tabs tablet Generic drug: bictegravir-emtricitabine-tenofovir AF   furosemide 40 MG tablet Commonly known as: LASIX   QUEtiapine 25 MG tablet Commonly known as: SEROQUEL     TAKE these medications   amLODipine 10 MG tablet Commonly known as: NORVASC Take 1 tablet (10 mg total) by mouth daily.   aspirin 81 MG EC tablet Take 1 tablet (81 mg total) by mouth daily.   hydrALAZINE 25 MG tablet Commonly known as:  APRESOLINE Take 1 tablet (25 mg total) by mouth every 8 (eight) hours.   metoprolol tartrate 25 MG tablet Commonly known as: LOPRESSOR Take 0.5 tablets (12.5 mg total) by mouth 2 (two) times daily.      Follow-up Information    [facility]             . Go on 03/25/2020.   Why: 04/01/2020 at 3 PM Contact information: [address]  [place] [phone]       Placey, Audrea Muscat, NP Follow up.   Why: Audrea Muscat is available to see pts at clinic at the [facility] ([address], [place], [place], [address]) Contact information: [place] [address] [phone]            Time coordinating discharge: 35 min  Signed:  Geradine Girt DO  [facility] 03/17/2020, 10:44 AM

## 2020-03-17 NOTE — Progress Notes (Signed)
Pt was given discharge summary, prescription medications and a taxi voucher. Went over discharge summary with patient. Pt expressed frustration with having left behind all her belongings in [place] and did not seem engaged in the discharge summary explanation. Pt was walked down to the main entrance and assisted into the [employer] taxi.

## 2020-03-17 NOTE — Care Management (Signed)
Prescriptions were sent to [facility] pharmacy. NCM entered patient in [facility] program with an over ride for co pays.   Magdalen Spatz RN

## 2020-03-17 NOTE — TOC Transition Note (Signed)
Transition of Care [facility]) - CM/SW Discharge Note   Patient Details  Name: Jane Estrada MRN: [id number] Date of Birth: [birth year]  Transition of Care [facility]) CM/SW Contact:  Alexander Mt, LCSW Phone Number: 03/17/2020, 8:52 AM   Clinical Narrative:    CSW provided bedside RN Jane Estrada with cab voucher for pt to discharge to [facility] per our conversation on 4/6. Pt medications will be filled by [facility] pharmacy, has f/u appointment with ID and information about NP clinic at [facility] has been placed on AVS.   Pt does not have cell phone therefore at this time unable to make [facility] referral.     Final next level of care: Homeless Shelter Barriers to Discharge: No Barriers Identified   Patient Goals and CMS Choice Patient states their goals for this hospitalization and ongoing recovery are:: wherever god leads her   Choice offered to / list presented to : Patient  Discharge Placement  Name of family member notified: pt responsible for self. Patient and family notified of of transfer: 03/17/20  Discharge Plan and Services In-house Referral: Clinical Social Work, PCP / Curator Services: CM Consult, Follow-up appt scheduled, Medication Assistance, [place] Program Post Acute Care Choice: [facility])            Readmission Risk Interventions No flowsheet data found.

## 2020-03-29 LAB — HIV GENOSURE(R) MG

## 2020-03-29 LAB — REFLEX TO GENOSURE(R) MG EDI: HIV GenoSure(R): 1

## 2020-03-29 LAB — HIV-1 RNA, PCR (GRAPH) RFX/GENO EDI
HIV-1 RNA BY PCR: 169000 copies/mL
HIV-1 RNA Quant, Log: 5.228 log10copy/mL

## 2020-04-01 ENCOUNTER — Telehealth: Payer: Self-pay | Admitting: Pharmacy Technician

## 2020-04-01 ENCOUNTER — Ambulatory Visit: Payer: Self-pay | Admitting: Internal Medicine

## 2020-04-01 NOTE — Telephone Encounter (Signed)
[facility] Patient Teacher, English as a foreign language completed.    The patient is uninsured (Medicaid is out of state) and will need patient assistance for medication.  We can complete the application and will need to meet with the patient for signatures and income documentation.  Jane Estrada. [name] Patient [facility] Phone: [phone] Fax:  [phone]

## 2020-04-02 ENCOUNTER — Other Ambulatory Visit: Payer: Self-pay

## 2020-04-02 ENCOUNTER — Telehealth: Payer: Self-pay | Admitting: *Deleted

## 2020-04-02 ENCOUNTER — Encounter: Payer: Self-pay | Admitting: *Deleted

## 2020-04-02 ENCOUNTER — Encounter (HOSPITAL_COMMUNITY): Payer: Self-pay

## 2020-04-02 ENCOUNTER — Ambulatory Visit (HOSPITAL_COMMUNITY)
Admission: EM | Admit: 2020-04-02 | Discharge: 2020-04-02 | Disposition: A | Discharge status: Home / Self Care | Payer: Self-pay | Attending: Family Medicine | Admitting: Family Medicine

## 2020-04-02 DIAGNOSIS — Z3202 Encounter for pregnancy test, result negative: Secondary | ICD-10-CM

## 2020-04-02 DIAGNOSIS — I10 Essential (primary) hypertension: Secondary | ICD-10-CM

## 2020-04-02 LAB — POC URINE PREG, ED: Preg Test, Ur: NEGATIVE

## 2020-04-02 LAB — POCT PREGNANCY, URINE: Preg Test, Ur: NEGATIVE

## 2020-04-02 NOTE — Congregational Nurse Program (Signed)
  Dept: [facility] Nurse Program Note  Date of Encounter: 04/02/2020  Past Medical History: Past Medical History:  Diagnosis Date  . Bipolar affective ([place])   . Chronic systolic CHF (congestive heart failure) ([place])   . Granular cell tumor   . HIV (human immunodeficiency virus infection) ([place])   . Hypertension   . Myocardial infarct ([place]) 2006  . Tobacco dependence     Encounter Details: CNP Questionnaire - 04/02/20 1430      Questionnaire   Patient Status  Not Applicable    Race  Black or African American    Location Patient Served At  [employer]    Uninsured  Not Applicable    Food  No food insecurities    Housing/Utilities  No permanent housing    Transportation  Yes, need transportation assistance    Interpersonal Safety  No, do not feel physically and emotionally safe where you currently live    Medication  No medication insecurities    Medical Provider  No    Referrals  Urgent Care    ED Visit Averted  Yes    Life-Saving Intervention Made  Not Applicable      Pt was in front of [facility] and requested blood pressure be taken when she saw CN in parking lot. Pt came into building and vitals were taken. Pt's blood pressure was taken and rechecked at 204/135. Pt reports she has not been taking her medication because she thinks she may be pregnant. Explained the importance of taking blood pressure medication. Pt has no other physical complaints at this time. Pt agreed to go to urgent care due to elevated blood pressure and wanting a pregnancy test. Transportation is being provided.

## 2020-04-02 NOTE — ED Triage Notes (Signed)
Patient c/o HTN. Reports she is not symptomatic.   Will need paper prescriptions.

## 2020-04-02 NOTE — Telephone Encounter (Signed)
[facility] transportation called and provided transportation to Urgent Care. Number given to client for return ride.

## 2020-04-03 NOTE — ED Provider Notes (Signed)
[facility]   [id number] 04/02/20 Arrival Time: [id number]  ASSESSMENT & PLAN:  1. Uncontrolled hypertension     No s/s of hypertensive urgency. She has enough of her medications at home. Basically she wanted a pregnancy test to know she could take HTN medications safely. UPT negative. Will begin taking medications again.  Recommend: Follow-up Information    Schedule an appointment as soon as possible for a visit  with [facility].   Contact information: [address] [place] [id number]          Reviewed expectations re: course of current medical issues. Questions answered. Outlined signs and symptoms indicating need for more acute intervention. Patient verbalized understanding. After Visit Summary given.   SUBJECTIVE:  Jane Estrada is a 44 y.o. female who presents with concerns regarding increased blood pressures. Dhe reports that she has not been taking medications; fears she may be pregnant. Here for pregnancy test.  She reports not taking medications regularly as instructed, no chest pain on exertion, no dyspnea on exertion, no swelling of ankles, no orthostatic dizziness or lightheadedness, no orthopnea or paroxysmal nocturnal dyspnea and no palpitations.  Denies symptoms of chest pain, palpations, orthopnea, nocturnal dyspnea, or LE edema.  Social History   Tobacco Use  Smoking Status Current Every Day Smoker  . Packs/day: 0.50  . Types: Cigarettes  Smokeless Tobacco Never Used     ROS: As per HPI.   OBJECTIVE:  Vitals:   04/02/20 1559  BP: (!) 186/124  Pulse: 86  Resp: 16  Temp: 98.1 F (36.7 C)  TempSrc: Oral  SpO2: 99%    General appearance: alert; no distress Eyes: PERRLA; EOMI HENT: normocephalic; atraumatic Neck: supple Lungs: unlabored Heart: regular Extremities: no edema; symmetrical with no gross deformities Skin: warm and dry Psychological: alert and cooperative; normal  mood and affect  ECG: Orders placed or performed during the hospital encounter of 03/15/20  . EKG 12-Lead  . EKG 12-Lead  . EKG 12-Lead (at 6am)  . EKG 12-Lead (at 6am)  . EKG 12-Lead  . EKG 12-Lead  . EKG    Labs: Results for orders placed or performed during the hospital encounter of 04/02/20  POC urine pregnancy  Result Value Ref Range   Preg Test, Ur NEGATIVE NEGATIVE  Pregnancy, urine POC  Result Value Ref Range   Preg Test, Ur NEGATIVE NEGATIVE   Labs Reviewed  POC URINE PREG, ED  POCT PREGNANCY, URINE     Allergies  Allergen Reactions  . Lisinopril Anaphylaxis  . Remeron [Mirtazapine] Shortness Of Breath  . Abilify [Aripiprazole] Rash    Past Medical History:  Diagnosis Date  . Bipolar affective ([place])   . Chronic systolic CHF (congestive heart failure) ([place])   . Granular cell tumor   . HIV (human immunodeficiency virus infection) ([place])   . Hypertension   . Myocardial infarct ([place]) 2006  . Tobacco dependence    Social History   Socioeconomic History  . Marital status: Single    Spouse name: Not on file  . Number of children: Not on file  . Years of education: Not on file  . Highest education level: Not on file  Occupational History  . Occupation: unemployed  Tobacco Use  . Smoking status: Current Every Day Smoker    Packs/day: 0.50    Types: Cigarettes  . Smokeless tobacco: Never Used  Substance and Sexual Activity  . Alcohol use: Not Currently  . Drug use: Yes  Frequency: 1.0 times per week    Types: Marijuana, Amphetamines  . Sexual activity: Not Currently  Other Topics Concern  . Not on file  Social History Narrative  . Not on file   Social Determinants of Health   Financial Resource Strain:   . Difficulty of Paying Living Expenses:   Food Insecurity:   . Worried About Charity fundraiser in the Last Year:   . Arboriculturist in the Last Year:   Transportation Needs:   . Film/video editor (Medical):   Marland Kitchen Lack of  Transportation (Non-Medical):   Physical Activity:   . Days of Exercise per Week:   . Minutes of Exercise per Session:   Stress:   . Feeling of Stress :   Social Connections:   . Frequency of Communication with Friends and Family:   . Frequency of Social Gatherings with Friends and Family:   . Attends Religious Services:   . Active Member of Clubs or Organizations:   . Attends Archivist Meetings:   Marland Kitchen Marital Status:   Intimate Partner Violence:   . Fear of Current or Ex-Partner:   . Emotionally Abused:   Marland Kitchen Physically Abused:   . Sexually Abused:    No family history on file. Past Surgical History:  Procedure Laterality Date  . none        Vanessa Kick, MD 04/03/20 1009

## 2020-05-23 ENCOUNTER — Other Ambulatory Visit: Payer: Self-pay

## 2020-05-23 ENCOUNTER — Inpatient Hospital Stay (HOSPITAL_COMMUNITY)
Admission: AD | Admit: 2020-05-23 | Discharge: 2020-05-24 | Disposition: A | Discharge status: Home / Self Care | Payer: Medicaid - Out of State | Attending: Emergency Medicine | Admitting: Emergency Medicine

## 2020-05-23 DIAGNOSIS — Z21 Asymptomatic human immunodeficiency virus [HIV] infection status: Secondary | ICD-10-CM | POA: Insufficient documentation

## 2020-05-23 DIAGNOSIS — I110 Hypertensive heart disease with heart failure: Secondary | ICD-10-CM | POA: Insufficient documentation

## 2020-05-23 DIAGNOSIS — Z79899 Other long term (current) drug therapy: Secondary | ICD-10-CM | POA: Insufficient documentation

## 2020-05-23 DIAGNOSIS — Z7982 Long term (current) use of aspirin: Secondary | ICD-10-CM | POA: Insufficient documentation

## 2020-05-23 DIAGNOSIS — F319 Bipolar disorder, unspecified: Secondary | ICD-10-CM | POA: Diagnosis not present

## 2020-05-23 DIAGNOSIS — Z9114 Patient's other noncompliance with medication regimen: Secondary | ICD-10-CM | POA: Insufficient documentation

## 2020-05-23 DIAGNOSIS — R110 Nausea: Secondary | ICD-10-CM | POA: Insufficient documentation

## 2020-05-23 DIAGNOSIS — Z888 Allergy status to other drugs, medicaments and biological substances status: Secondary | ICD-10-CM | POA: Diagnosis not present

## 2020-05-23 DIAGNOSIS — R109 Unspecified abdominal pain: Secondary | ICD-10-CM | POA: Diagnosis not present

## 2020-05-23 DIAGNOSIS — D61818 Other pancytopenia: Secondary | ICD-10-CM | POA: Insufficient documentation

## 2020-05-23 DIAGNOSIS — Z86018 Personal history of other benign neoplasm: Secondary | ICD-10-CM | POA: Diagnosis not present

## 2020-05-23 DIAGNOSIS — Z3202 Encounter for pregnancy test, result negative: Secondary | ICD-10-CM | POA: Diagnosis not present

## 2020-05-23 DIAGNOSIS — I10 Essential (primary) hypertension: Secondary | ICD-10-CM | POA: Diagnosis not present

## 2020-05-23 DIAGNOSIS — F17210 Nicotine dependence, cigarettes, uncomplicated: Secondary | ICD-10-CM | POA: Diagnosis not present

## 2020-05-23 DIAGNOSIS — N898 Other specified noninflammatory disorders of vagina: Secondary | ICD-10-CM | POA: Diagnosis not present

## 2020-05-23 DIAGNOSIS — N12 Tubulo-interstitial nephritis, not specified as acute or chronic: Secondary | ICD-10-CM

## 2020-05-23 DIAGNOSIS — R1030 Lower abdominal pain, unspecified: Secondary | ICD-10-CM | POA: Insufficient documentation

## 2020-05-23 DIAGNOSIS — I5022 Chronic systolic (congestive) heart failure: Secondary | ICD-10-CM | POA: Insufficient documentation

## 2020-05-23 DIAGNOSIS — I252 Old myocardial infarction: Secondary | ICD-10-CM | POA: Diagnosis not present

## 2020-05-23 LAB — COMPREHENSIVE METABOLIC PANEL
ALT: 10 U/L (ref 0–44)
AST: 14 U/L — ABNORMAL LOW (ref 15–41)
Albumin: 3.3 g/dL — ABNORMAL LOW (ref 3.5–5.0)
Alkaline Phosphatase: 40 U/L (ref 38–126)
Anion gap: 10 (ref 5–15)
BUN: 7 mg/dL (ref 6–20)
CO2: 24 mmol/L (ref 22–32)
Calcium: 9.2 mg/dL (ref 8.9–10.3)
Chloride: 100 mmol/L (ref 98–111)
Creatinine, Ser: 0.95 mg/dL (ref 0.44–1.00)
GFR calc Af Amer: >60 mL/min (ref >60)
GFR calc non Af Amer: >60 mL/min (ref >60)
Glucose, Bld: 122 mg/dL — ABNORMAL HIGH (ref 70–99)
Potassium: 4 mmol/L (ref 3.5–5.1)
Sodium: 134 mmol/L — ABNORMAL LOW (ref 135–145)
Total Bilirubin: 1.1 mg/dL (ref 0.3–1.2)
Total Protein: 8 g/dL (ref 6.5–8.1)

## 2020-05-23 LAB — CBC
HCT: 37.3 % (ref 36.0–46.0)
Hemoglobin: 11.5 g/dL — ABNORMAL LOW (ref 12.0–15.0)
MCH: 27.6 pg (ref 26.0–34.0)
MCHC: 30.8 g/dL (ref 30.0–36.0)
MCV: 89.4 fL (ref 80.0–100.0)
Platelets: 175 10*3/uL (ref 150–400)
RBC: 4.17 MIL/uL (ref 3.87–5.11)
RDW: 13.5 % (ref 11.5–15.5)
WBC: 5.5 10*3/uL (ref 4.0–10.5)
nRBC: 0 % (ref 0.0–0.2)

## 2020-05-23 LAB — LIPASE, BLOOD: Lipase: 28 U/L (ref 11–51)

## 2020-05-23 LAB — POCT PREGNANCY, URINE: Preg Test, Ur: NEGATIVE

## 2020-05-23 MED ORDER — SODIUM CHLORIDE 0.9% FLUSH: 3.0000 mL | Freq: Once | INTRAVENOUS | Status: DC

## 2020-05-23 NOTE — MAU Provider Note (Signed)
First Provider Initiated Contact with Patient 05/23/20 2043      S [name] is a 44 y.o. No obstetric history on file. non-pregnant female who presents to MAU today with complaint of abdominal, bilateral flank pain, & nausea. Symptoms started over a week ago. Patient thought she was pregnant due to a positive home test over a month ago. Denies any other symptoms.  Had chronic hypertension but isn't taking her medications because "they don't work".    O BP (!) 187/120 (BP Location: Right Arm)   Pulse 90   Temp (!) 100.7 F (38.2 C) (Oral)   Resp 20   SpO2 100%  Physical Exam  Nursing note and vitals reviewed. Constitutional: She appears distressed.  HENT:  Head: Normocephalic and atraumatic.  GI: There is abdominal tenderness.  Neurological: She is alert.  Skin: Skin is warm and dry.  Psychiatric: Her behavior is normal. Mood normal.   Results for orders placed or performed during the hospital encounter of 05/23/20 (from the past 24 hour(s))  Pregnancy, urine POC     Status: None   Collection Time: 05/23/20  8:35 PM  Result Value Ref Range   Preg Test, Ur NEGATIVE NEGATIVE    A Non pregnant female Medical screening exam complete 1. Abdominal pain in female   2. Negative pregnancy test   3. Essential hypertension      P Transfer to [facility] for further evaluation Dr. Eulis Estrada (EDP) notified of patient  Jane Guild, NP 05/23/2020 8:46 PM

## 2020-05-23 NOTE — MAU Note (Addendum)
Pt here via EMS with abdominal pain, "kidney pain" and N/V that started about 1 week ago. Just brushed it off. Has not taken anything for pain in 4 days. Tried ibuprofen, but with minimal relief. Has HTN. No currently taking BP medication.

## 2020-05-23 NOTE — MAU Note (Signed)
Report given to ED charge RN, [place]. Pt transferred via wheelchair to triage.

## 2020-05-23 NOTE — ED Triage Notes (Signed)
Pt was brought to MAU via EMS.  Onset over  Week ago abd, bilateral flank pain, and nausea.  Pt thought she was pregnant.   Not taking HTN medications.  Pt was transferred to [facility] ED as urine preg is negative.

## 2020-05-24 ENCOUNTER — Inpatient Hospital Stay (HOSPITAL_COMMUNITY): Payer: Medicaid - Out of State

## 2020-05-24 LAB — URINALYSIS, ROUTINE W REFLEX MICROSCOPIC
Bilirubin Urine: NEGATIVE
Glucose, UA: NEGATIVE mg/dL
Ketones, ur: 5 mg/dL — AB
Nitrite: POSITIVE — AB
Protein, ur: 30 mg/dL — AB
Specific Gravity, Urine: 1.011 (ref 1.005–1.030)
pH: 6 (ref 5.0–8.0)

## 2020-05-24 LAB — WET PREP, GENITAL
Sperm: NONE SEEN
Trich, Wet Prep: NONE SEEN
Yeast Wet Prep HPF POC: NONE SEEN

## 2020-05-24 MED ORDER — ONDANSETRON 4 MG PO TBDP
4.0000 mg | ORAL_TABLET | Freq: Three times a day (TID) | ORAL | 0 refills | Status: DC | PRN
Start: 2020-05-24 — End: 2020-12-29

## 2020-05-24 MED ORDER — FENTANYL CITRATE (PF) 100 MCG/2ML IJ SOLN
50.0000 ug | Freq: Once | INTRAMUSCULAR | Status: AC
  Administered 2020-05-24: 50 ug via INTRAVENOUS
  Filled 2020-05-24: qty 2

## 2020-05-24 MED ORDER — HYDROCODONE-ACETAMINOPHEN 5-325 MG PO TABS: 1.0000 | ORAL_TABLET | ORAL | 0 refills | Status: DC | PRN

## 2020-05-24 MED ORDER — SODIUM CHLORIDE 0.9 % IV SOLN
1.0000 g | Freq: Once | INTRAVENOUS | Status: AC
  Administered 2020-05-24: 1 g via INTRAVENOUS
  Filled 2020-05-24: qty 10

## 2020-05-24 MED ORDER — CEPHALEXIN 500 MG PO CAPS: 500.0000 mg | ORAL_CAPSULE | Freq: Four times a day (QID) | ORAL | 0 refills | Status: AC

## 2020-05-24 MED ORDER — ONDANSETRON HCL 4 MG/2ML IJ SOLN
4.0000 mg | Freq: Once | INTRAMUSCULAR | Status: AC
  Administered 2020-05-24: 4 mg via INTRAVENOUS
  Filled 2020-05-24: qty 2

## 2020-05-24 MED ORDER — ACETAMINOPHEN 500 MG PO TABS
1000.0000 mg | ORAL_TABLET | Freq: Once | ORAL | Status: AC
  Administered 2020-05-24: 1000 mg via ORAL
  Filled 2020-05-24: qty 2

## 2020-05-24 MED ORDER — HYDRALAZINE HCL 25 MG PO TABS: 25.0000 mg | ORAL_TABLET | Freq: Three times a day (TID) | ORAL | 0 refills | Status: DC

## 2020-05-24 NOTE — Discharge Instructions (Addendum)
It is important to take the medications as prescribed. These medications are for your kidney infection, nausea, pain and for blood pressure control.  Follow up with [facility] for primary care needs.   If you develop worsening symptoms please return to the emergency department.

## 2020-05-24 NOTE — ED Provider Notes (Signed)
[facility] EMERGENCY DEPARTMENT Provider Note   CSN: [id number] Arrival date & time: 05/23/20  2021     History Chief Complaint  Patient presents with  . Abdominal Pain    Jane Estrada is a 44 y.o. female.  Patient to ED with bilateral flank and lower abdominal pain that has gotten progressively worse over the last 7 days. She has had nausea, and reports a vaginal discharge x 2 days. No urinary symptoms, specifically, no frequency, hematuria or dysuria. She has taken ibuprofen for pain without relief. No known fever at home. No chest pain, SOB, cough. She reports normal bowel movements.   The history is provided by the patient. No language interpreter was used.  Abdominal Pain Associated symptoms: nausea and vaginal discharge   Associated symptoms: no chest pain, no chills, no constipation, no diarrhea, no dysuria, no fever, no hematuria and no shortness of breath        Past Medical History:  Diagnosis Date  . Bipolar affective ([place])   . Chronic systolic CHF (congestive heart failure) ([place])   . Granular cell tumor   . HIV (human immunodeficiency virus infection) ([place])   . Hypertension   . Myocardial infarct ([place]) 2006  . Tobacco dependence     Patient Active Problem List   Diagnosis Date Noted  . Chest pain 03/15/2020  . Polysubstance abuse ([place]) 03/15/2020  . Pancytopenia ([place]) 03/15/2020  . 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 Tobacco dependence   . Hypertension   . HIV (human immunodeficiency virus infection) ([place])   . Granular cell tumor   . Chronic systolic CHF (congestive heart failure) ([place])   . Bipolar affective ([place])   . History of myocardial infarction 2006    Past Surgical History:  Procedure Laterality Date  . none       OB History   No obstetric history on file.     No family history on file.  Social History   Tobacco Use  . Smoking status: Current Every Day Smoker    Packs/day: 0.50    Types:  Cigarettes  . Smokeless tobacco: Never Used  Vaping Use  . Vaping Use: Never used  Substance Use Topics  . Alcohol use: Not Currently  . Drug use: Yes    Frequency: 1.0 times per week    Types: Marijuana, Amphetamines    Home Medications Prior to Admission medications   Medication Sig Start Date End Date Taking? Authorizing Provider  amLODipine (NORVASC) 10 MG tablet Take 1 tablet (10 mg total) by mouth daily. 03/17/20   Geradine Girt, DO  aspirin EC 81 MG EC tablet Take 1 tablet (81 mg total) by mouth daily. 03/17/20   Geradine Girt, DO  hydrALAZINE (APRESOLINE) 25 MG tablet Take 1 tablet (25 mg total) by mouth every 8 (eight) hours. 03/17/20   Geradine Girt, DO  metoprolol tartrate (LOPRESSOR) 25 MG tablet Take 0.5 tablets (12.5 mg total) by mouth 2 (two) times daily. 03/17/20   Geradine Girt, DO    Allergies    Lisinopril, Remeron [mirtazapine], and Abilify [aripiprazole]  Review of Systems   Review of Systems  Constitutional: Negative for chills and fever.  HENT: Negative.   Respiratory: Negative.  Negative for shortness of breath.   Cardiovascular: Negative.  Negative for chest pain.  Gastrointestinal: Positive for abdominal pain and nausea. Negative for constipation and diarrhea.  Genitourinary: Positive for flank pain and vaginal discharge. Negative for dysuria, frequency and hematuria.  Musculoskeletal: Negative for myalgias.  Skin: Negative.   Neurological: Negative.  Negative for weakness.    Physical Exam Updated Vital Signs BP (!) 165/113 (BP Location: Right Arm)   Pulse 84   Temp 99.9 F (37.7 C) (Oral)   Resp 16   Ht 5\' 4"  (1.626 m)   Wt 61.2 kg   SpO2 100%   BMI 23.17 kg/m   Physical Exam Vitals and nursing note reviewed.  Constitutional:      Appearance: She is well-developed.     Comments: Uncomfortable appearing  HENT:     Head: Normocephalic.  Cardiovascular:     Rate and Rhythm: Normal rate and regular rhythm.     Heart sounds: No murmur  heard.   Pulmonary:     Effort: Pulmonary effort is normal.     Breath sounds: Normal breath sounds. No wheezing, rhonchi or rales.  Abdominal:     General: Bowel sounds are normal.     Palpations: Abdomen is soft.     Tenderness: There is abdominal tenderness in the right lower quadrant, suprapubic area and left lower quadrant. There is right CVA tenderness and left CVA tenderness. There is no guarding or rebound.  Genitourinary:    Vagina: Vaginal discharge (White vaginal discharge with smooth consistency. ) present.     Cervix: No cervical motion tenderness, discharge or friability.     Uterus: Normal. Tender.      Adnexa: Right adnexa normal and left adnexa normal.       Right: No tenderness or fullness.         Left: No tenderness or fullness.    Musculoskeletal:        General: Normal range of motion.     Cervical back: Normal range of motion and neck supple.  Skin:    General: Skin is warm and dry.     Findings: No rash.  Neurological:     Mental Status: She is alert and oriented to person, place, and time.     ED Results / Procedures / Treatments   Labs (all labs ordered are listed, but only abnormal results are displayed) Labs Reviewed  COMPREHENSIVE METABOLIC PANEL - Abnormal; Notable for the following components:      Result Value   Sodium 134 (*)    Glucose, Bld 122 (*)    Albumin 3.3 (*)    AST 14 (*)    All other components within normal limits  CBC - Abnormal; Notable for the following components:   Hemoglobin 11.5 (*)    All other components within normal limits  LIPASE, BLOOD  URINALYSIS, ROUTINE W REFLEX MICROSCOPIC  POCT PREGNANCY, URINE   Results for orders placed or performed during the hospital encounter of 05/23/20  Wet prep, genital   Specimen: PATH Cytology Cervicovaginal Ancillary Only  Result Value Ref Range   Yeast Wet Prep HPF POC NONE SEEN NONE SEEN   Trich, Wet Prep NONE SEEN NONE SEEN   Clue Cells Wet Prep HPF POC PRESENT (A) NONE  SEEN   WBC, Wet Prep HPF POC FEW (A) NONE SEEN   Sperm NONE SEEN   Lipase, blood  Result Value Ref Range   Lipase 28 11 - 51 U/L  Comprehensive metabolic panel  Result Value Ref Range   Sodium 134 (L) 135 - 145 mmol/L   Potassium 4.0 3.5 - 5.1 mmol/L   Chloride 100 98 - 111 mmol/L   CO2 24 22 - 32 mmol/L   Glucose, Bld 122 (  H) 70 - 99 mg/dL   BUN 7 6 - 20 mg/dL   Creatinine, Ser 0.95 0.44 - 1.00 mg/dL   Calcium 9.2 8.9 - 10.3 mg/dL   Total Protein 8.0 6.5 - 8.1 g/dL   Albumin 3.3 (L) 3.5 - 5.0 g/dL   AST 14 (L) 15 - 41 U/L   ALT 10 0 - 44 U/L   Alkaline Phosphatase 40 38 - 126 U/L   Total Bilirubin 1.1 0.3 - 1.2 mg/dL   GFR calc non Af Amer >60 >60 mL/min   GFR calc Af Amer >60 >60 mL/min   Anion gap 10 5 - 15  CBC  Result Value Ref Range   WBC 5.5 4.0 - 10.5 K/uL   RBC 4.17 3.87 - 5.11 MIL/uL   Hemoglobin 11.5 (L) 12.0 - 15.0 g/dL   HCT 37.3 36 - 46 %   MCV 89.4 80.0 - 100.0 fL   MCH 27.6 26.0 - 34.0 pg   MCHC 30.8 30.0 - 36.0 g/dL   RDW 13.5 11.5 - 15.5 %   Platelets 175 150 - 400 K/uL   nRBC 0.0 0.0 - 0.2 %  Urinalysis, Routine w reflex microscopic  Result Value Ref Range   Color, Urine YELLOW YELLOW   APPearance HAZY (A) CLEAR   Specific Gravity, Urine 1.011 1.005 - 1.030   pH 6.0 5.0 - 8.0   Glucose, UA NEGATIVE NEGATIVE mg/dL   Hgb urine dipstick SMALL (A) NEGATIVE   Bilirubin Urine NEGATIVE NEGATIVE   Ketones, ur 5 (A) NEGATIVE mg/dL   Protein, ur 30 (A) NEGATIVE mg/dL   Nitrite POSITIVE (A) NEGATIVE   Leukocytes,Ua MODERATE (A) NEGATIVE   RBC / HPF 0-5 0 - 5 RBC/hpf   WBC, UA 21-50 0 - 5 WBC/hpf   Bacteria, UA MANY (A) NONE SEEN   Squamous Epithelial / LPF 0-5 0 - 5   Mucus PRESENT   Pregnancy, urine POC  Result Value Ref Range   Preg Test, Ur NEGATIVE NEGATIVE    EKG None  Radiology No results found.  Procedures Procedures (including critical care time)  Medications Ordered in ED Medications  sodium chloride flush (NS) 0.9 % injection 3  mL (has no administration in time range)  ondansetron (ZOFRAN) injection 4 mg (has no administration in time range)  fentaNYL (SUBLIMAZE) injection 50 mcg (has no administration in time range)    ED Course  I have reviewed the triage vital signs and the nursing notes.  Pertinent labs & imaging results that were available during my care of the patient were reviewed by me and considered in my medical decision making (see chart for details).    MDM Rules/Calculators/A&P                          Patient to ED with bilateral flank and lower abdominal pain as per HPI.   She is extremely uncomfortable appearing, restless, difficulty sitting still secondary to pain. Calm, cooperative, rational. IV established, pain medication ordered.    DDx: PID vs UTI/pyelo. With uncontrolled hypertension and distribution of pain, consider aneurysm, however, hemodynamically stable. Will wait for labs.   Tests show she has a UTI. Rocephin started. Less concerned regarding PID given normal appearance of vaginal discharge, lack of CMT or adnexal tenderness. Given bilateral flank pain will obtain CT renal study for further evaluation. She has a rectal temperature of 101.1. Suspect pyelonephritis. No leukocytosis. Pain improved with medications. Nausea resolved. Recheck of  temperature 98.1.   She is felt appropriate for discharge home with antibiotic, pain and nausea control. Will consult TOC team to facilitate patient establishing with appropriate care providers.   Final Clinical Impression(s) / ED Diagnoses Final diagnoses:  Abdominal pain in female  Negative pregnancy test  Essential hypertension   1. Pyelonephritis 2. Hypertension, essential 3. History of HIV  Rx / DC Orders ED Discharge Orders    None       Dennie Bible 05/24/20 5427    Fatima Blank, MD 05/25/20 [phone]

## 2020-05-24 NOTE — ED Notes (Signed)
Patient verbalizes understanding of discharge instructions. Opportunity for questioning and answers were provided. Armband removed by staff, pt discharged from ED. Ambulated out to lobby  

## 2020-05-25 LAB — GC/CHLAMYDIA PROBE AMP (~~LOC~~) NOT AT ARMC
Chlamydia: NEGATIVE
Comment: NEGATIVE
Comment: NORMAL
Neisseria Gonorrhea: NEGATIVE

## 2020-05-26 ENCOUNTER — Encounter: Payer: Self-pay | Admitting: *Deleted

## 2020-05-26 NOTE — Congregational Nurse Program (Signed)
°  Dept: [facility] Nurse Program Note  Date of Encounter: 05/26/2020  Past Medical History: Past Medical History:  Diagnosis Date   Bipolar affective ([place])    Chronic systolic CHF (congestive heart failure) ([place])    Granular cell tumor    HIV (human immunodeficiency virus infection) ([place])    Hypertension    Myocardial infarct ([place]) 2006   Tobacco dependence     Encounter Details:  CNP Questionnaire - 05/26/20 1253      Questionnaire   Patient Status Not Applicable    Race Black or African American    Location Patient Served At [employer]    Uninsured Not Applicable    Food No food insecurities    Housing/Utilities No permanent housing    Transportation Yes, need transportation assistance    Interpersonal Safety No, do not feel physically and emotionally safe where you currently live    Medication No medication insecurities    Medical Provider No    Referrals Not Applicable    ED Visit Averted Not Applicable    Life-Saving Intervention Made Not Applicable          Pt came in to talk with CN after recently being seen at ED for a kidney infection. Pt has antibiotics and reports taking medication as prescribed. Pt has elevated blood pressure and can name risk factors and side effects to medications. Pt has access to get blood pressure medication and is choosing not to take the medication. Support and encouragement offered. Pt has no signs or symptoms of distress at this time.

## 2020-06-18 ENCOUNTER — Emergency Department (HOSPITAL_COMMUNITY)
Admission: EM | Admit: 2020-06-18 | Discharge: 2020-06-19 | Disposition: A | Discharge status: Home / Self Care | Payer: Medicaid - Out of State | Attending: Emergency Medicine | Admitting: Emergency Medicine

## 2020-06-18 ENCOUNTER — Emergency Department (HOSPITAL_COMMUNITY): Payer: Medicaid - Out of State

## 2020-06-18 ENCOUNTER — Encounter (HOSPITAL_COMMUNITY): Payer: Self-pay

## 2020-06-18 ENCOUNTER — Other Ambulatory Visit: Payer: Self-pay

## 2020-06-18 DIAGNOSIS — F17210 Nicotine dependence, cigarettes, uncomplicated: Secondary | ICD-10-CM | POA: Diagnosis not present

## 2020-06-18 DIAGNOSIS — N12 Tubulo-interstitial nephritis, not specified as acute or chronic: Secondary | ICD-10-CM | POA: Diagnosis not present

## 2020-06-18 DIAGNOSIS — R1033 Periumbilical pain: Secondary | ICD-10-CM | POA: Insufficient documentation

## 2020-06-18 DIAGNOSIS — R1012 Left upper quadrant pain: Secondary | ICD-10-CM | POA: Insufficient documentation

## 2020-06-18 DIAGNOSIS — I5022 Chronic systolic (congestive) heart failure: Secondary | ICD-10-CM | POA: Insufficient documentation

## 2020-06-18 DIAGNOSIS — Z7982 Long term (current) use of aspirin: Secondary | ICD-10-CM | POA: Insufficient documentation

## 2020-06-18 DIAGNOSIS — M545 Low back pain: Secondary | ICD-10-CM | POA: Diagnosis present

## 2020-06-18 DIAGNOSIS — Z79899 Other long term (current) drug therapy: Secondary | ICD-10-CM | POA: Insufficient documentation

## 2020-06-18 DIAGNOSIS — W19XXXA Unspecified fall, initial encounter: Secondary | ICD-10-CM

## 2020-06-18 DIAGNOSIS — I110 Hypertensive heart disease with heart failure: Secondary | ICD-10-CM | POA: Diagnosis not present

## 2020-06-18 DIAGNOSIS — R350 Frequency of micturition: Secondary | ICD-10-CM | POA: Diagnosis not present

## 2020-06-18 LAB — COMPREHENSIVE METABOLIC PANEL
ALT: 16 U/L (ref 0–44)
AST: 30 U/L (ref 15–41)
Albumin: 4.1 g/dL (ref 3.5–5.0)
Alkaline Phosphatase: 45 U/L (ref 38–126)
Anion gap: 8 (ref 5–15)
BUN: 14 mg/dL (ref 6–20)
CO2: 26 mmol/L (ref 22–32)
Calcium: 9.3 mg/dL (ref 8.9–10.3)
Chloride: 107 mmol/L (ref 98–111)
Creatinine, Ser: 0.92 mg/dL (ref 0.44–1.00)
GFR calc Af Amer: >60 mL/min (ref >60)
GFR calc non Af Amer: >60 mL/min (ref >60)
Glucose, Bld: 84 mg/dL (ref 70–99)
Potassium: 4 mmol/L (ref 3.5–5.1)
Sodium: 141 mmol/L (ref 135–145)
Total Bilirubin: 0.9 mg/dL (ref 0.3–1.2)
Total Protein: 8.4 g/dL — ABNORMAL HIGH (ref 6.5–8.1)

## 2020-06-18 LAB — CBC
HCT: 35.5 % — ABNORMAL LOW (ref 36.0–46.0)
Hemoglobin: 10.7 g/dL — ABNORMAL LOW (ref 12.0–15.0)
MCH: 27.4 pg (ref 26.0–34.0)
MCHC: 30.1 g/dL (ref 30.0–36.0)
MCV: 90.8 fL (ref 80.0–100.0)
Platelets: 148 10*3/uL — ABNORMAL LOW (ref 150–400)
RBC: 3.91 MIL/uL (ref 3.87–5.11)
RDW: 15.4 % (ref 11.5–15.5)
WBC: 2.1 10*3/uL — ABNORMAL LOW (ref 4.0–10.5)
nRBC: 0 % (ref 0.0–0.2)

## 2020-06-18 LAB — URINALYSIS, ROUTINE W REFLEX MICROSCOPIC
Bilirubin Urine: NEGATIVE
Glucose, UA: NEGATIVE mg/dL
Hgb urine dipstick: NEGATIVE
Ketones, ur: 20 mg/dL — AB
Nitrite: POSITIVE — AB
Protein, ur: 30 mg/dL — AB
Specific Gravity, Urine: 1.02 (ref 1.005–1.030)
WBC, UA: >50 WBC/hpf — ABNORMAL HIGH (ref 0–5)
pH: 6 (ref 5.0–8.0)

## 2020-06-18 LAB — LIPASE, BLOOD: Lipase: 36 U/L (ref 11–51)

## 2020-06-18 LAB — I-STAT BETA HCG BLOOD, ED (MC, WL, AP ONLY): I-stat hCG, quantitative: <5 m[IU]/mL (ref <5)

## 2020-06-18 MED ORDER — SODIUM CHLORIDE 0.9% FLUSH
3.0000 mL | Freq: Once | INTRAVENOUS | Status: AC
  Administered 2020-06-18: 3 mL via INTRAVENOUS

## 2020-06-18 MED ORDER — IOHEXOL 300 MG/ML  SOLN
100.0000 mL | Freq: Once | INTRAMUSCULAR | Status: AC | PRN
  Administered 2020-06-18: 100 mL via INTRAVENOUS

## 2020-06-18 MED ORDER — FENTANYL CITRATE (PF) 100 MCG/2ML IJ SOLN
50.0000 ug | Freq: Once | INTRAMUSCULAR | Status: AC
  Administered 2020-06-18: 50 ug via INTRAVENOUS
  Filled 2020-06-18: qty 2
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
## 2020-06-19 MED ORDER — METHOCARBAMOL 500 MG PO TABS
500.0000 mg | ORAL_TABLET | Freq: Three times a day (TID) | ORAL | 0 refills | Status: DC | PRN
Start: 2020-06-19 — End: 2020-12-29

## 2020-06-19 MED ORDER — SODIUM CHLORIDE 0.9 % IV SOLN
1.0000 g | Freq: Once | INTRAVENOUS | Status: AC
  Administered 2020-06-19: 1 g via INTRAVENOUS
  Filled 2020-06-19: qty 10

## 2020-06-19 MED ORDER — HYDROMORPHONE HCL 1 MG/ML IJ SOLN
1.0000 mg | Freq: Once | INTRAMUSCULAR | Status: AC
  Administered 2020-06-19: 1 mg via INTRAVENOUS
  Filled 2020-06-19: qty 1

## 2020-06-19 MED ORDER — CEFPODOXIME PROXETIL 200 MG PO TABS: 200.0000 mg | ORAL_TABLET | Freq: Two times a day (BID) | ORAL | 0 refills | Status: DC

## 2020-06-19 MED ORDER — KETOROLAC TROMETHAMINE 15 MG/ML IJ SOLN: 15.0000 mg | Freq: Once | INTRAMUSCULAR | Status: DC

## 2020-06-19 MED ORDER — PHENAZOPYRIDINE HCL 200 MG PO TABS
200.0000 mg | ORAL_TABLET | Freq: Three times a day (TID) | ORAL | 0 refills | Status: DC | PRN
Start: 2020-06-19 — End: 2020-12-29

## 2020-06-19 NOTE — Discharge Instructions (Signed)
You were seen in the emergency department today for back and abdominal pain after a fall.  Your labs are fairly similar to prior labs you have had done.  Your urine showed findings of a urinary tract infection which we are concerned may have spread to your kidney.  Your CT scan showed findings of possible fibroids, please follow up with obgyn for ultrasound to further assess this.   We are sending you home with Cefpodoxime, an antibiotic, to treat your infection.  We have sent your urine for culture to ensure this is the appropriate antibiotic.  We are also sending you home with Robaxin and Pyridium to help with pain.  - Robaxin is the muscle relaxer I have prescribed, this is meant to help with muscle tightness. Be aware that this medication may make you drowsy therefore the first time you take this it should be at a time you are in an environment where you can rest. Do not drive or operate heavy machinery when taking this medication. Do not drink alcohol or take other sedating medications with this medicine such as narcotics or benzodiazepines.   - Pyridium-please take 1 tablet every 8 hours as needed for bladder spasms/pain.  You make take Tylenol per over the counter dosing with these medications.   We have prescribed you new medication(s) today. Discuss the medications prescribed today with your pharmacist as they can have adverse effects and interactions with your other medicines including over the counter and prescribed medications. Seek medical evaluation if you start to experience new or abnormal symptoms after taking one of these medicines, seek care immediately if you start to experience difficulty breathing, feeling of your throat closing, facial swelling, or rash as these could be indications of a more serious allergic reaction   Please follow with your primary care provider within 3 days.  Please have your blood pressure rechecked at your follow-up appointment as it was elevated in the ER  today.  Return to the ER for new or worsening symptoms including but not limited to increased pain, fever, inability to keep fluids down, numbness/weakness, loss of control bowel or bladder function, or any other concerns.

## 2020-06-19 NOTE — ED Notes (Signed)
Nurse spoke to pt about how high her blood pressure was. Pt states she is prescribed medication but does not take it.

## 2020-06-22 LAB — URINE CULTURE: Culture: >=100000 — AB

## 2020-06-23 ENCOUNTER — Telehealth: Payer: Self-pay

## 2020-06-23 NOTE — Telephone Encounter (Signed)
Post ED Visit - Positive Culture Follow-up  Culture report reviewed by antimicrobial stewardship pharmacist: [place] Team []  Elenor Quinones, Pharm.D. []  Heide Guile, Pharm.D., BCPS AQ-ID []  Parks Neptune, Pharm.D., BCPS []  Alycia Rossetti, Pharm.D., BCPS []  [place], Pharm.D., BCPS, AAHIVP []  Legrand Como, Pharm.D., BCPS, AAHIVP []  Salome Arnt, PharmD, BCPS []  Johnnette Gourd, PharmD, BCPS []  Hughes Better, PharmD, BCPS []  Leeroy Cha, PharmD []  Laqueta Linden, PharmD, BCPS []  Albertina Parr, PharmD  [place] Team []  Leodis Sias, PharmD []  Lindell Spar, PharmD [x]  Royetta Asal, PharmD []  Graylin Shiver, Rph []  Rema Fendt) Glennon Mac, PharmD []  Arlyn Dunning, PharmD []  Netta Cedars, PharmD []  Dia Sitter, PharmD []  Leone Haven, PharmD []  Gretta Arab, PharmD []  Theodis Shove, PharmD []  Peggyann Juba, PharmD []  Reuel Boom, PharmD   Positive urine culture Treated with Cefpodoxime Proxetil, organism sensitive to the same and no further patient follow-up is required at this time.  Genia Del 06/23/2020, 10:49 AM

## 2020-06-30 ENCOUNTER — Other Ambulatory Visit: Payer: Self-pay

## 2020-06-30 ENCOUNTER — Emergency Department (HOSPITAL_COMMUNITY): Payer: Medicaid - Out of State

## 2020-06-30 ENCOUNTER — Emergency Department (HOSPITAL_COMMUNITY)
Admission: EM | Admit: 2020-06-30 | Discharge: 2020-06-30 | Disposition: A | Discharge status: Home / Self Care | Payer: Medicaid - Out of State | Attending: Emergency Medicine | Admitting: Emergency Medicine

## 2020-06-30 DIAGNOSIS — R05 Cough: Secondary | ICD-10-CM | POA: Insufficient documentation

## 2020-06-30 DIAGNOSIS — Z20822 Contact with and (suspected) exposure to covid-19: Secondary | ICD-10-CM | POA: Insufficient documentation

## 2020-06-30 DIAGNOSIS — R0602 Shortness of breath: Secondary | ICD-10-CM | POA: Insufficient documentation

## 2020-06-30 DIAGNOSIS — Z201 Contact with and (suspected) exposure to tuberculosis: Secondary | ICD-10-CM | POA: Diagnosis not present

## 2020-06-30 DIAGNOSIS — Z5321 Procedure and treatment not carried out due to patient leaving prior to being seen by health care provider: Secondary | ICD-10-CM | POA: Insufficient documentation

## 2020-06-30 LAB — CBC
HCT: 34.9 % — ABNORMAL LOW (ref 36.0–46.0)
Hemoglobin: 10.7 g/dL — ABNORMAL LOW (ref 12.0–15.0)
MCH: 27.4 pg (ref 26.0–34.0)
MCHC: 30.7 g/dL (ref 30.0–36.0)
MCV: 89.5 fL (ref 80.0–100.0)
Platelets: 174 10*3/uL (ref 150–400)
RBC: 3.9 MIL/uL (ref 3.87–5.11)
RDW: 15.5 % (ref 11.5–15.5)
WBC: 2.2 10*3/uL — ABNORMAL LOW (ref 4.0–10.5)
nRBC: 0 % (ref 0.0–0.2)

## 2020-06-30 LAB — BASIC METABOLIC PANEL
Anion gap: 9 (ref 5–15)
BUN: 18 mg/dL (ref 6–20)
CO2: 22 mmol/L (ref 22–32)
Calcium: 9.8 mg/dL (ref 8.9–10.3)
Chloride: 109 mmol/L (ref 98–111)
Creatinine, Ser: 0.92 mg/dL (ref 0.44–1.00)
GFR calc Af Amer: >60 mL/min (ref >60)
GFR calc non Af Amer: >60 mL/min (ref >60)
Glucose, Bld: 107 mg/dL — ABNORMAL HIGH (ref 70–99)
Potassium: 3.6 mmol/L (ref 3.5–5.1)
Sodium: 140 mmol/L (ref 135–145)

## 2020-06-30 LAB — I-STAT BETA HCG BLOOD, ED (MC, WL, AP ONLY): I-stat hCG, quantitative: <5 m[IU]/mL (ref <5)

## 2020-06-30 LAB — TROPONIN I (HIGH SENSITIVITY)
Troponin I (High Sensitivity): 25 ng/L — ABNORMAL HIGH (ref <18)
Troponin I (High Sensitivity): 35 ng/L — ABNORMAL HIGH (ref <18)

## 2020-06-30 MED ORDER — SODIUM CHLORIDE 0.9% FLUSH: 3.0000 mL | Freq: Once | INTRAVENOUS | Status: DC

## 2020-06-30 NOTE — ED Triage Notes (Addendum)
Pt presents to ED BIB [facility]. Pt c/o sudden onset SOB and productive cough. Then reports CP. EMS gave nitro x1, 324 ASA, pain approved with nitro. Pt homeless unknown exposure to COVID or TB  20 R forearm 198/143 Hr - 80 rr - 30 86% RA, 100% 3L [place]

## 2020-06-30 NOTE — ED Notes (Signed)
Pt not coming in for vitals recheck. Pt states when her bf can come in she will come in
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
## 2020-07-23 ENCOUNTER — Encounter: Payer: Self-pay | Admitting: *Deleted
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
## 2020-08-11 ENCOUNTER — Encounter (HOSPITAL_COMMUNITY): Payer: Self-pay

## 2020-08-11 ENCOUNTER — Emergency Department (HOSPITAL_COMMUNITY)
Admission: EM | Admit: 2020-08-11 | Discharge: 2020-08-12 | Disposition: A | Discharge status: Home / Self Care | Payer: Medicaid - Out of State | Attending: Emergency Medicine | Admitting: Emergency Medicine

## 2020-08-11 DIAGNOSIS — R42 Dizziness and giddiness: Secondary | ICD-10-CM | POA: Insufficient documentation

## 2020-08-11 DIAGNOSIS — H9209 Otalgia, unspecified ear: Secondary | ICD-10-CM | POA: Insufficient documentation

## 2020-08-11 DIAGNOSIS — R519 Headache, unspecified: Secondary | ICD-10-CM | POA: Insufficient documentation

## 2020-08-11 DIAGNOSIS — Z79899 Other long term (current) drug therapy: Secondary | ICD-10-CM | POA: Insufficient documentation

## 2020-08-11 DIAGNOSIS — R5383 Other fatigue: Secondary | ICD-10-CM | POA: Diagnosis present

## 2020-08-11 DIAGNOSIS — R590 Localized enlarged lymph nodes: Secondary | ICD-10-CM | POA: Diagnosis not present

## 2020-08-11 DIAGNOSIS — I5022 Chronic systolic (congestive) heart failure: Secondary | ICD-10-CM | POA: Insufficient documentation

## 2020-08-11 DIAGNOSIS — K0889 Other specified disorders of teeth and supporting structures: Secondary | ICD-10-CM | POA: Diagnosis not present

## 2020-08-11 DIAGNOSIS — I110 Hypertensive heart disease with heart failure: Secondary | ICD-10-CM | POA: Diagnosis not present

## 2020-08-11 DIAGNOSIS — M791 Myalgia, unspecified site: Secondary | ICD-10-CM | POA: Insufficient documentation

## 2020-08-11 DIAGNOSIS — F17210 Nicotine dependence, cigarettes, uncomplicated: Secondary | ICD-10-CM | POA: Insufficient documentation

## 2020-08-11 DIAGNOSIS — R509 Fever, unspecified: Secondary | ICD-10-CM | POA: Insufficient documentation

## 2020-08-11 DIAGNOSIS — Z20822 Contact with and (suspected) exposure to covid-19: Secondary | ICD-10-CM | POA: Diagnosis not present

## 2020-08-11 DIAGNOSIS — K209 Esophagitis, unspecified without bleeding: Secondary | ICD-10-CM | POA: Insufficient documentation

## 2020-08-11 LAB — SARS CORONAVIRUS 2 BY RT PCR (HOSPITAL ORDER, PERFORMED IN ~~LOC~~ HOSPITAL LAB): SARS Coronavirus 2: NEGATIVE

## 2020-08-11 NOTE — ED Triage Notes (Signed)
Pt BIB [facility] for eval of sore throat and malaise x 1 month

## 2020-08-12 ENCOUNTER — Emergency Department (HOSPITAL_COMMUNITY): Payer: Medicaid - Out of State

## 2020-08-12 LAB — COMPREHENSIVE METABOLIC PANEL
ALT: 7 U/L (ref 0–44)
AST: 17 U/L (ref 15–41)
Albumin: 3.1 g/dL — ABNORMAL LOW (ref 3.5–5.0)
Alkaline Phosphatase: 32 U/L — ABNORMAL LOW (ref 38–126)
Anion gap: 12 (ref 5–15)
BUN: 10 mg/dL (ref 6–20)
CO2: 23 mmol/L (ref 22–32)
Calcium: 9.1 mg/dL (ref 8.9–10.3)
Chloride: 100 mmol/L (ref 98–111)
Creatinine, Ser: 0.94 mg/dL (ref 0.44–1.00)
GFR calc Af Amer: >60 mL/min (ref >60)
GFR calc non Af Amer: >60 mL/min (ref >60)
Glucose, Bld: 94 mg/dL (ref 70–99)
Potassium: 3.4 mmol/L — ABNORMAL LOW (ref 3.5–5.1)
Sodium: 135 mmol/L (ref 135–145)
Total Bilirubin: 1.2 mg/dL (ref 0.3–1.2)
Total Protein: 7.5 g/dL (ref 6.5–8.1)

## 2020-08-12 LAB — MONONUCLEOSIS SCREEN: Mono Screen: NEGATIVE

## 2020-08-12 LAB — LACTIC ACID, PLASMA: Lactic Acid, Venous: 0.8 mmol/L (ref 0.5–1.9)

## 2020-08-12 LAB — I-STAT BETA HCG BLOOD, ED (MC, WL, AP ONLY): I-stat hCG, quantitative: <5 m[IU]/mL (ref <5)

## 2020-08-12 LAB — GROUP A STREP BY PCR: Group A Strep by PCR: NOT DETECTED

## 2020-08-12 MED ORDER — ACETAMINOPHEN 160 MG/5ML PO SOLN
650.0000 mg | Freq: Once | ORAL | Status: AC
  Administered 2020-08-12: 650 mg via ORAL
  Filled 2020-08-12: qty 20.3

## 2020-08-12 MED ORDER — AMOXICILLIN-POT CLAVULANATE 400-57 MG/5ML PO SUSR: 875.0000 mg | Freq: Two times a day (BID) | ORAL | 0 refills | Status: AC

## 2020-08-12 MED ORDER — IOHEXOL 300 MG/ML  SOLN
75.0000 mL | Freq: Once | INTRAMUSCULAR | Status: AC | PRN
  Administered 2020-08-12: 75 mL via INTRAVENOUS

## 2020-08-12 NOTE — Discharge Instructions (Signed)
Thank you for allowing me to care for you today in the Emergency Department.   Take Augmentin as prescribed.  You can take liquid Tylenol and Motrin for your symptoms.  Call to schedule a follow-up appointment with ear nose and throat if your symptoms do not subsequently improve with this regimen.  Return to the emergency department if you develop difficulty breathing, if you pass out, if you develop uncontrollable vomiting, if you become unable to swallow your own saliva, or other new, concerning symptoms.

## 2020-08-12 NOTE — ED Provider Notes (Signed)
[facility] EMERGENCY DEPARTMENT Provider Note   CSN: [id number] Arrival date & time: 08/11/20  1810     History Chief Complaint  Patient presents with  . Sore Throat    Jane Estrada is a 44 y.o. female with a history of granular cell tumor of the trachea, HIV not on treatment, chronic systolic congestive heart failure, tobacco use disorder, methamphetamine use disorder, bipolar affective disorder, HTN, and CAD who presents to the emergency department with a chief complaint of fatigue.  The patient reports that approximately 1 month ago that she developed a sudden onset pain to her upper and lower teeth.  Dental pain has persisted, upper greater than lower teeth, and the pain now radiates to her right ear and she has been having right-sided headaches.  She feels as though she has had a difficult time focusing with her vision, particularly her right eye, over the last couple of days.  No diplopia or amaurosis fugax.  She was treating her symptoms with ibuprofen and Goody powder with no improvement in her symptoms.  Approximately 3 weeks ago, she began having dysphagia with solid foods that progressed to dysphagia with liquids approximately 1 week ago.    Over the last week, she has become progressively fatigued and had significant p.o. intake with a 20 pound weight loss over the last month.  Earlier today, she became so dizzy and lightheaded while walking that she called EMS.  However, she does note that she has voided 3 times today.  No shortness of breath, chest pain, cough, loss of sense of taste or smell, confusion, neck pain or stiffness, muffled voice, nausea, vomiting, diarrhea, eye redness, pain, or swelling, dysuria, hematuria, vaginal discharge or pain,no pain with movement of the eye, or night sweats.  She denies recent fever or chills, but spiked a fever while she was waiting in the ER.  Per chart review, the patient was having hemoptysis last month when she was  seen by the congregational nurse.  However, she reports that this is since resolved.  Patient suffers from homelessness.  She has not been on maintenance therapy for HIV in more than 10 years.  She does not take any daily medications.  She reports daily tobacco use, but denies other illicit or recreational substance use at this time.  Denies alcohol use.  Jane Estrada was evaluated in Emergency Department on 08/12/2020 for the symptoms described in the history of present illness. She was evaluated in the context of the global COVID-19 pandemic, which necessitated consideration that the patient might be at risk for infection with the SARS-CoV-2 virus that causes COVID-19. Institutional protocols and algorithms that pertain to the evaluation of patients at risk for COVID-19 are in a state of rapid change based on information released by regulatory bodies including the CDC and federal and state organizations. These policies and algorithms were followed during the patient's care in the ED.    The history is provided by the patient and medical records. No language interpreter was used.       Past Medical History:  Diagnosis Date  . Bipolar affective ([place])   . Chronic systolic CHF (congestive heart failure) ([place])   . Granular cell tumor   . HIV (human immunodeficiency virus infection) ([place])   . Hypertension   . Myocardial infarct ([place]) 2006  . Tobacco dependence     Patient Active Problem List   Diagnosis Date Noted  . Chest pain 03/15/2020  . Polysubstance abuse ([place]) 03/15/2020  .  Pancytopenia ([place]) 03/15/2020  . 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 Tobacco dependence   . Hypertension   . HIV (human immunodeficiency virus infection) ([place])   . Granular cell tumor   . Chronic systolic CHF (congestive heart failure) ([place])   . Bipolar affective ([place])   . History of myocardial infarction 2006    Past Surgical History:  Procedure Laterality Date  . none       OB  History   No obstetric history on file.     Family History  Problem Relation Age of Onset  . Hypertension Mother   . Cancer Father     Social History   Tobacco Use  . Smoking status: Current Every Day Smoker    Packs/day: 0.50    Types: Cigarettes  . Smokeless tobacco: Never Used  Vaping Use  . Vaping Use: Never used  Substance Use Topics  . Alcohol use: Not Currently  . Drug use: Yes    Frequency: 1.0 times per week    Types: Marijuana, Amphetamines    Home Medications Prior to Admission medications   Medication Sig Start Date End Date Taking? Authorizing Provider  amoxicillin-clavulanate (AUGMENTIN) 400-57 MG/5ML suspension Take 10.9 mLs (875 mg total) by mouth 2 (two) times daily for 7 days. 08/12/20 08/19/20  Richell Corker A, PA-C  cefpodoxime (VANTIN) 200 MG tablet Take 1 tablet (200 mg total) by mouth 2 (two) times daily. 06/19/20   Petrucelli, Samantha R, PA-C  methocarbamol (ROBAXIN) 500 MG tablet Take 1 tablet (500 mg total) by mouth every 8 (eight) hours as needed for muscle spasms. 06/19/20   Petrucelli, Samantha R, PA-C  ondansetron (ZOFRAN ODT) 4 MG disintegrating tablet Take 1 tablet (4 mg total) by mouth every 8 (eight) hours as needed for nausea or vomiting. 05/24/20   Charlann Lange, PA-C  phenazopyridine (PYRIDIUM) 200 MG tablet Take 1 tablet (200 mg total) by mouth 3 (three) times daily as needed for pain. 06/19/20   Petrucelli, Samantha R, PA-C  amLODipine (NORVASC) 10 MG tablet Take 1 tablet (10 mg total) by mouth daily. Patient not taking: Reported on 05/24/2020 03/17/20 06/19/20  Geradine Girt, DO  hydrALAZINE (APRESOLINE) 25 MG tablet Take 1 tablet (25 mg total) by mouth every 8 (eight) hours. Patient not taking: Reported on 06/18/2020 05/24/20 06/19/20  Charlann Lange, PA-C  metoprolol tartrate (LOPRESSOR) 25 MG tablet Take 0.5 tablets (12.5 mg total) by mouth 2 (two) times daily. Patient not taking: Reported on 05/24/2020 03/17/20 06/19/20  Geradine Girt, DO     Allergies    Lisinopril, Remeron [mirtazapine], and Abilify [aripiprazole]  Review of Systems   Review of Systems  Constitutional: Positive for fatigue and fever. Negative for activity change.  HENT: Positive for dental problem, ear pain, sore throat and trouble swallowing. Negative for sneezing and voice change.   Respiratory: Negative for cough, choking, shortness of breath, wheezing and stridor.   Cardiovascular: Negative for chest pain, palpitations and leg swelling.  Gastrointestinal: Negative for abdominal pain, diarrhea, nausea and vomiting.  Genitourinary: Negative for dysuria, hematuria, vaginal discharge and vaginal pain.  Musculoskeletal: Positive for myalgias. Negative for arthralgias, back pain, neck pain and neck stiffness.  Skin: Negative for rash and wound.  Allergic/Immunologic: Negative for immunocompromised state.  Neurological: Positive for dizziness, light-headedness and headaches. Negative for syncope, weakness and numbness.  Psychiatric/Behavioral: Negative for confusion.    Physical Exam Updated Vital Signs BP (!) 128/96   Pulse 70   Temp 98.7 F (37.1 C) (  Oral)   Resp 17   LMP 07/20/2020   SpO2 100%   Physical Exam Vitals and nursing note reviewed.  Constitutional:      General: She is not in acute distress. HENT:     Head: Normocephalic.     Right Ear: Tympanic membrane, ear canal and external ear normal.     Left Ear: Tympanic membrane, ear canal and external ear normal.     Nose: Congestion present.     Comments: Tender to palpation over the right maxillary sinus.  No left maxillary sinus tenderness.  No frontal sinus tenderness bilaterally.    Mouth/Throat:     Mouth: Mucous membranes are dry.     Comments: 1+ tonsillar edema on the left. 0 on the right.  Posterior oropharynx is erythematous, but not edematous and there are no exudates.  Uvula is midline.  Tolerating secretions without difficulty.  No tripoding.  Phonation is normal.  No  sublingual edema. Eyes:     Extraocular Movements: Extraocular movements intact.     Conjunctiva/sclera: Conjunctivae normal.     Pupils: Pupils are equal, round, and reactive to light.  Neck:     Vascular: No carotid bruit.     Comments: + Anterior cervical lymphadenopathy. + Supraclavicular lymphadenopathy. Cardiovascular:     Rate and Rhythm: Normal rate and regular rhythm.     Pulses: Normal pulses.     Heart sounds: Normal heart sounds. No murmur heard.  No friction rub. No gallop.   Pulmonary:     Effort: Pulmonary effort is normal. No respiratory distress.     Breath sounds: No stridor. No wheezing, rhonchi or rales.     Comments: Speaks in complete, fluent sentences without increased work of breathing. Chest:     Chest wall: No tenderness.  Abdominal:     General: There is no distension.     Palpations: Abdomen is soft. There is no mass.     Tenderness: There is no abdominal tenderness. There is no right CVA tenderness, left CVA tenderness, guarding or rebound.     Hernia: No hernia is present.     Comments: Abdomen soft, nontender, nondistended.  Musculoskeletal:     Cervical back: Normal range of motion and neck supple. No rigidity or tenderness.     Right lower leg: No edema.     Left lower leg: No edema.  Lymphadenopathy:     Cervical: Cervical adenopathy present.  Skin:    General: Skin is warm.     Capillary Refill: Capillary refill takes less than 2 seconds.     Findings: No rash.     Comments: Skin is hot to the touch  Neurological:     Mental Status: She is alert.  Psychiatric:        Behavior: Behavior normal.     ED Results / Procedures / Treatments   Labs (all labs ordered are listed, but only abnormal results are displayed) Labs Reviewed  COMPREHENSIVE METABOLIC PANEL - Abnormal; Notable for the following components:      Result Value   Potassium 3.4 (*)    Albumin 3.1 (*)    Alkaline Phosphatase 32 (*)    All other components within normal  limits  SARS CORONAVIRUS 2 BY RT PCR (HOSPITAL ORDER, [facility])  GROUP A STREP BY PCR  CULTURE, BLOOD (ROUTINE X 2)  CULTURE, BLOOD (ROUTINE X 2)  LACTIC ACID, PLASMA  MONONUCLEOSIS SCREEN  CBC WITH DIFFERENTIAL/PLATELET  CBC WITH DIFFERENTIAL/PLATELET  I-STAT BETA HCG BLOOD, ED ([facility], [facility], [facility] ONLY)    EKG None  Radiology DG Chest 2 View  Result Date: 08/12/2020 CLINICAL DATA:  Fever, cough, sore throat, malaise for 1 month EXAM: CHEST - 2 VIEW COMPARISON:  03/15/2020 FINDINGS: Frontal and lateral views of the chest demonstrate an unremarkable cardiac silhouette. No airspace disease, effusion, or pneumothorax. No acute bony abnormalities. IMPRESSION: 1. No acute intrathoracic process. Electronically Signed   By: Randa Ngo M.D.   On: 08/12/2020 02:24   CT Soft Tissue Neck W Contrast  Result Date: 08/12/2020 CLINICAL DATA:  Sore throat and malaise for 1 month with adenopathy. EXAM: CT NECK WITH CONTRAST TECHNIQUE: Multidetector CT imaging of the neck was performed using the standard protocol following the bolus administration of intravenous contrast. CONTRAST:  75mL OMNIPAQUE IOHEXOL 300 MG/ML  SOLN COMPARISON:  None. FINDINGS: Pharynx and larynx: Prominent tonsillar size and enhancement without asymmetry or superimposed masslike density. Salivary glands: No inflammation, mass, or stone. Thyroid: The left lobe is larger than the right but there is no underlying nodularity. Lymph nodes: Symmetric jugulodigastric nodal enlargement, up to 3 cm in length. Vascular: No focal abnormality or calcification Limited intracranial: Negative Visualized orbits: Negative Mastoids and visualized paranasal sinuses: Clear Skeleton: No acute or aggressive process. Upper chest: Fluid/debris in the upper esophagus without superimposed wall thickening. IMPRESSION: 1. Mild tonsillar and upper cervical nodal enlargement which could reflect acute tonsillitis/adenitis or be reactive to  patient's HIV status. 2. Fluid in the upper esophagus without superimposed wall thickening, please correlate or associated symptoms. Electronically Signed   By: Monte Fantasia M.D.   On: 08/12/2020 05:25    Procedures Procedures (including critical care time)  Medications Ordered in ED Medications  acetaminophen (TYLENOL) 160 MG/5ML solution 650 mg (650 mg Oral Given 08/12/20 0248)  iohexol (OMNIPAQUE) 300 MG/ML solution 75 mL (75 mLs Intravenous Contrast Given 08/12/20 0454)    ED Course  I have reviewed the triage vital signs and the nursing notes.  Pertinent labs & imaging results that were available during my care of the patient were reviewed by me and considered in my medical decision making (see chart for details).    MDM Rules/Calculators/A&P                          44 year old female with a history of granular cell tumor of the trachea, HIV not on treatment, chronic systolic congestive heart failure, tobacco use disorder, methamphetamine use disorder, bipolar affective disorder, HTN, and CAD presenting with 1 month of dental pain and worsening solid and liquid dysphagia.  States that she has had a 25 pound weight loss.  Denied constitutional symptoms, but was found to be febrile in the ER.  On exam, uvula is midline.  She is tolerating secretions without difficulty.  No shortness of breath.  Trachea is midline.  She does have 1+ tonsillar edema on the left.  Strep PCR is negative.  COVID-19 test is negative.  Given concerns for weight loss with history of granular cell tumor as well as untreated HIV, will order labs and CT imaging for further evaluation.  The patient has been seen and independently evaluated by Dr. Christy Gentles, attending physician.  No significant electrolyte derangements.  Lactate is normal.  CT with mild lymphadenopathy concerning for acute tonsillitis adenitis or reactive secondary to HIV.  Fluid was also noted in the upper esophagus without wall thickening.   Per  patient's medical record, she was  seen by ENT in 2020 with similar symptoms and was treated with with Augmentin.  We will plan to treat with the same today for nonspecific esophagitis as patient was febrile.  Blood culture x1 has been sent.  Patient was successfully fluid challenge in the ER.  There is no indication for admission at this time given that she is hemodynamically stable, no acute distress, and tolerating liquids.  Will place transition of care consult to help coordinate patient with medication with NP [facility] at the [facility].  We will also provide her with a referral to ENT.  There is no evidence of deep space abscess, neoplasm, obstruction, or airway compromise.  Patient has been counseled on restarting HIV maintenance medications, but declines at this time.  All questions answered.  She is hemodynamically stable in no acute distress.  Safe for discharge with outpatient follow-up as indicated.  Final Clinical Impression(s) / ED Diagnoses Final diagnoses:  Acute esophagitis    Rx / DC Orders ED Discharge Orders         Ordered    amoxicillin-clavulanate (AUGMENTIN) 400-57 MG/5ML suspension  2 times daily        08/12/20 0659           Joline Maxcy A, PA-C 08/12/20 0732    Ripley Fraise, MD 08/13/20 [phone]

## 2020-08-12 NOTE — ED Provider Notes (Signed)
Patient seen/examined in the Emergency Department in conjunction with Advanced Practice Provider [place] Patient reports sore throat, fever and weight loss.  She has h/o HIV Exam : awake/alert, no distress, no stridor, no dysphonia on my exam Plan: pt with fever, wt. Loss and h/o untreated HIV.  Will expand infectious workup Vitals appropriate at this time     Ripley Fraise, MD 08/12/20 [phone]

## 2020-08-17 LAB — CULTURE, BLOOD (ROUTINE X 2)
Culture: NO GROWTH
Culture: NO GROWTH
Special Requests: ADEQUATE
Special Requests: ADEQUATE

## 2020-09-16 ENCOUNTER — Encounter (HOSPITAL_COMMUNITY): Payer: Self-pay | Admitting: Emergency Medicine

## 2020-09-16 ENCOUNTER — Other Ambulatory Visit: Payer: Self-pay

## 2020-09-16 ENCOUNTER — Emergency Department (HOSPITAL_COMMUNITY)
Admission: EM | Admit: 2020-09-16 | Discharge: 2020-09-16 | Disposition: A | Discharge status: Home / Self Care | Payer: Medicaid - Out of State | Attending: Emergency Medicine | Admitting: Emergency Medicine

## 2020-09-16 DIAGNOSIS — J029 Acute pharyngitis, unspecified: Secondary | ICD-10-CM

## 2020-09-16 DIAGNOSIS — I5022 Chronic systolic (congestive) heart failure: Secondary | ICD-10-CM | POA: Insufficient documentation

## 2020-09-16 DIAGNOSIS — R070 Pain in throat: Secondary | ICD-10-CM | POA: Diagnosis present

## 2020-09-16 DIAGNOSIS — Z79899 Other long term (current) drug therapy: Secondary | ICD-10-CM | POA: Insufficient documentation

## 2020-09-16 DIAGNOSIS — I110 Hypertensive heart disease with heart failure: Secondary | ICD-10-CM | POA: Diagnosis not present

## 2020-09-16 DIAGNOSIS — R109 Unspecified abdominal pain: Secondary | ICD-10-CM | POA: Insufficient documentation

## 2020-09-16 DIAGNOSIS — Z21 Asymptomatic human immunodeficiency virus [HIV] infection status: Secondary | ICD-10-CM | POA: Diagnosis not present

## 2020-09-16 DIAGNOSIS — F17210 Nicotine dependence, cigarettes, uncomplicated: Secondary | ICD-10-CM | POA: Diagnosis not present

## 2020-09-16 DIAGNOSIS — Z20822 Contact with and (suspected) exposure to covid-19: Secondary | ICD-10-CM | POA: Diagnosis not present

## 2020-09-16 LAB — URINALYSIS, ROUTINE W REFLEX MICROSCOPIC
Bilirubin Urine: NEGATIVE
Glucose, UA: NEGATIVE mg/dL
Ketones, ur: NEGATIVE mg/dL
Leukocytes,Ua: NEGATIVE
Nitrite: NEGATIVE
Protein, ur: NEGATIVE mg/dL
Specific Gravity, Urine: 1.012 (ref 1.005–1.030)
pH: 7 (ref 5.0–8.0)

## 2020-09-16 LAB — GROUP A STREP BY PCR: Group A Strep by PCR: NOT DETECTED

## 2020-09-16 LAB — CBC
HCT: 30.2 % — ABNORMAL LOW (ref 36.0–46.0)
Hemoglobin: 8.6 g/dL — ABNORMAL LOW (ref 12.0–15.0)
MCH: 26.5 pg (ref 26.0–34.0)
MCHC: 28.5 g/dL — ABNORMAL LOW (ref 30.0–36.0)
MCV: 93.2 fL (ref 80.0–100.0)
Platelets: 243 10*3/uL (ref 150–400)
RBC: 3.24 MIL/uL — ABNORMAL LOW (ref 3.87–5.11)
RDW: 14.2 % (ref 11.5–15.5)
WBC: 2 10*3/uL — ABNORMAL LOW (ref 4.0–10.5)
nRBC: 0 % (ref 0.0–0.2)

## 2020-09-16 LAB — PREGNANCY, URINE: Preg Test, Ur: NEGATIVE

## 2020-09-16 LAB — BASIC METABOLIC PANEL
Anion gap: 10 (ref 5–15)
BUN: 11 mg/dL (ref 6–20)
CO2: 26 mmol/L (ref 22–32)
Calcium: 9.1 mg/dL (ref 8.9–10.3)
Chloride: 103 mmol/L (ref 98–111)
Creatinine, Ser: 0.82 mg/dL (ref 0.44–1.00)
GFR calc non Af Amer: >60 mL/min (ref >60)
Glucose, Bld: 91 mg/dL (ref 70–99)
Potassium: 4 mmol/L (ref 3.5–5.1)
Sodium: 139 mmol/L (ref 135–145)

## 2020-09-16 LAB — RESPIRATORY PANEL BY RT PCR (FLU A&B, COVID)
Influenza A by PCR: NEGATIVE
Influenza B by PCR: NEGATIVE
SARS Coronavirus 2 by RT PCR: NEGATIVE

## 2020-09-16 NOTE — Discharge Instructions (Addendum)
Recommend over-the-counter throat lozenges as needed.  Recommend Tylenol Motrin as needed for pain.  If you develop worsening pain either throat, abdomen, fevers or vomiting, return to ER for reassessment.

## 2020-09-16 NOTE — ED Provider Notes (Signed)
[facility] EMERGENCY DEPARTMENT Provider Note   CSN: [id number] Arrival date & time: 09/16/20  1014     History Chief Complaint  Patient presents with  . Sore Throat  . Flank Pain    Jane Estrada is a 44 y.o. female. Presents to ER with complaints of sore throat and flank pain. She reports that for the last couple weeks she has noted sore throat, pain in her throat. Is able to swallow without difficulty, no difficulty breathing. States that her symptoms improved after she received Augmentin for similar episode a few weeks ago. She thinks that this is gotten worse since she has been back on the streets. She also has noted some intermittent left side flank pain. No dysuria, hematuria, no fevers, no abdominal pain nausea or vomiting.  HPI     Past Medical History:  Diagnosis Date  . Bipolar affective ([place])   . Chronic systolic CHF (congestive heart failure) ([place])   . Granular cell tumor   . HIV (human immunodeficiency virus infection) ([place])   . Hypertension   . Myocardial infarct ([place]) 2006  . Tobacco dependence     Patient Active Problem List   Diagnosis Date Noted  . Chest pain 03/15/2020  . Polysubstance abuse ([place]) 03/15/2020  . Pancytopenia ([place]) 03/15/2020  . 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 Tobacco dependence   . Hypertension   . HIV (human immunodeficiency virus infection) ([place])   . Granular cell tumor   . Chronic systolic CHF (congestive heart failure) ([place])   . Bipolar affective ([place])   . History of myocardial infarction 2006    Past Surgical History:  Procedure Laterality Date  . none       OB History   No obstetric history on file.     Family History  Problem Relation Age of Onset  . Hypertension Mother   . Cancer Father     Social History   Tobacco Use  . Smoking status: Current Every Day Smoker    Packs/day: 0.50    Types: Cigarettes  . Smokeless tobacco: Never Used  Vaping Use  . Vaping  Use: Never used  Substance Use Topics  . Alcohol use: Not Currently  . Drug use: Yes    Frequency: 1.0 times per week    Types: Marijuana, Amphetamines    Home Medications Prior to Admission medications   Medication Sig Start Date End Date Taking? Authorizing Provider  cefpodoxime (VANTIN) 200 MG tablet Take 1 tablet (200 mg total) by mouth 2 (two) times daily. 06/19/20   Petrucelli, Samantha R, PA-C  methocarbamol (ROBAXIN) 500 MG tablet Take 1 tablet (500 mg total) by mouth every 8 (eight) hours as needed for muscle spasms. 06/19/20   Petrucelli, Samantha R, PA-C  ondansetron (ZOFRAN ODT) 4 MG disintegrating tablet Take 1 tablet (4 mg total) by mouth every 8 (eight) hours as needed for nausea or vomiting. 05/24/20   Charlann Lange, PA-C  phenazopyridine (PYRIDIUM) 200 MG tablet Take 1 tablet (200 mg total) by mouth 3 (three) times daily as needed for pain. 06/19/20   Petrucelli, Samantha R, PA-C  amLODipine (NORVASC) 10 MG tablet Take 1 tablet (10 mg total) by mouth daily. Patient not taking: Reported on 05/24/2020 03/17/20 06/19/20  Geradine Girt, DO  hydrALAZINE (APRESOLINE) 25 MG tablet Take 1 tablet (25 mg total) by mouth every 8 (eight) hours. Patient not taking: Reported on 06/18/2020 05/24/20 06/19/20  Charlann Lange, PA-C  metoprolol tartrate (LOPRESSOR) 25 MG tablet  Take 0.5 tablets (12.5 mg total) by mouth 2 (two) times daily. Patient not taking: Reported on 05/24/2020 03/17/20 06/19/20  Geradine Girt, DO    Allergies    Lisinopril, Remeron [mirtazapine], and Abilify [aripiprazole]  Review of Systems   Review of Systems  Constitutional: Negative for chills and fever.  HENT: Positive for sore throat. Negative for ear pain.   Eyes: Negative for pain and visual disturbance.  Respiratory: Negative for cough and shortness of breath.   Cardiovascular: Negative for chest pain and palpitations.  Gastrointestinal: Negative for abdominal pain and vomiting.  Genitourinary: Negative for dysuria  and hematuria.  Musculoskeletal: Negative for arthralgias and back pain.  Skin: Negative for color change and rash.  Neurological: Negative for seizures and syncope.  All other systems reviewed and are negative.   Physical Exam Updated Vital Signs BP (!) 176/115 (BP Location: Right Arm)   Pulse 81   Temp 98.4 F (36.9 C) (Oral)   Resp 20   Ht 5\' 4"  (1.626 m)   Wt 61.2 kg   SpO2 97%   BMI 23.17 kg/m   Physical Exam Vitals and nursing note reviewed.  Constitutional:      General: She is not in acute distress.    Appearance: She is well-developed.  HENT:     Head: Normocephalic and atraumatic.     Nose: No congestion.     Mouth/Throat:     Mouth: Mucous membranes are moist. No oral lesions.     Pharynx: Oropharynx is clear. No oropharyngeal exudate or posterior oropharyngeal erythema.  Eyes:     Conjunctiva/sclera: Conjunctivae normal.  Cardiovascular:     Rate and Rhythm: Normal rate and regular rhythm.     Heart sounds: No murmur heard.   Pulmonary:     Effort: Pulmonary effort is normal. No respiratory distress.     Breath sounds: Normal breath sounds.  Abdominal:     Palpations: Abdomen is soft.     Tenderness: There is no abdominal tenderness.  Musculoskeletal:     Cervical back: Neck supple.  Skin:    General: Skin is warm and dry.  Neurological:     Mental Status: She is alert.     ED Results / Procedures / Treatments   Labs (all labs ordered are listed, but only abnormal results are displayed) Labs Reviewed  URINALYSIS, ROUTINE W REFLEX MICROSCOPIC - Abnormal; Notable for the following components:      Result Value   APPearance HAZY (*)    Hgb urine dipstick MODERATE (*)    Bacteria, UA RARE (*)    All other components within normal limits  CBC - Abnormal; Notable for the following components:   WBC 2.0 (*)    RBC 3.24 (*)    Hemoglobin 8.6 (*)    HCT 30.2 (*)    MCHC 28.5 (*)    All other components within normal limits  GROUP A STREP BY PCR    RESPIRATORY PANEL BY RT PCR (FLU A&B, COVID)  BASIC METABOLIC PANEL  PREGNANCY, URINE    EKG None  Radiology No results found.  Procedures Procedures (including critical care time)  Medications Ordered in ED Medications - No data to display  ED Course  I have reviewed the triage vital signs and the nursing notes.  Pertinent labs & imaging results that were available during my care of the patient were reviewed by me and considered in my medical decision making (see chart for details).    MDM Rules/Calculators/A&P  44 year old lady presents to ER with chief complaint of sore throat and secondary complaint of left flank pain. Burning sore throat, her neck is supple, no swelling noted, her posterior oropharynx is clear, no exudates or erythema noted. Suspect she may have viral pharyngitis but very low suspicion for bacterial pharyngitis given her reassuring physical exam. Will send strep swab, if positive will treat otherwise do not feel antibiotics are indicated at this time. We will also check COVID-19.  Regarding flank pain, no CVA tenderness, minimal tenderness in the left flank, no abdominal tenderness, left-sided only.  Doubt Pyelo based on UA results.  Based on description of pain, exam, suspect more likely MSK strain.  Low suspicion for stone disease.  Recommend patient follow-up with primary doctor, return for worsening symptoms and additional symptoms like fever, vomiting, etc.    After the discussed management above, the patient was determined to be safe for discharge.  The patient was in agreement with this plan and all questions regarding their care were answered.  ED return precautions were discussed and the patient will return to the ED with any significant worsening of condition.  Final Clinical Impression(s) / ED Diagnoses Final diagnoses:  Sore throat    Rx / DC Orders ED Discharge Orders    None       Lucrezia Starch, MD 09/16/20  1257

## 2020-09-16 NOTE — ED Triage Notes (Signed)
Pt arrives to ED with complaints of pain in her throat. Was dx with esophagitis on 9/2. Pt seen home on Augmentin. States pain was better at first but she is now living on the streets which made it worse. Also complains of left flank pain starting x2 days ago.

## 2020-11-16 ENCOUNTER — Other Ambulatory Visit: Payer: Self-pay

## 2020-11-16 ENCOUNTER — Emergency Department (HOSPITAL_COMMUNITY)
Admission: EM | Admit: 2020-11-16 | Discharge: 2020-11-16 | Disposition: A | Discharge status: Home / Self Care | Payer: Medicaid - Out of State | Attending: Emergency Medicine | Admitting: Emergency Medicine

## 2020-11-16 DIAGNOSIS — R070 Pain in throat: Secondary | ICD-10-CM | POA: Diagnosis present

## 2020-11-16 DIAGNOSIS — I110 Hypertensive heart disease with heart failure: Secondary | ICD-10-CM | POA: Insufficient documentation

## 2020-11-16 DIAGNOSIS — F17210 Nicotine dependence, cigarettes, uncomplicated: Secondary | ICD-10-CM | POA: Insufficient documentation

## 2020-11-16 DIAGNOSIS — I5022 Chronic systolic (congestive) heart failure: Secondary | ICD-10-CM | POA: Insufficient documentation

## 2020-11-16 DIAGNOSIS — K2091 Esophagitis, unspecified with bleeding: Secondary | ICD-10-CM | POA: Insufficient documentation

## 2020-11-16 DIAGNOSIS — K209 Esophagitis, unspecified without bleeding: Secondary | ICD-10-CM

## 2020-11-16 DIAGNOSIS — Z79899 Other long term (current) drug therapy: Secondary | ICD-10-CM | POA: Insufficient documentation

## 2020-11-16 DIAGNOSIS — Z21 Asymptomatic human immunodeficiency virus [HIV] infection status: Secondary | ICD-10-CM | POA: Diagnosis not present

## 2020-11-16 DIAGNOSIS — B20 Human immunodeficiency virus [HIV] disease: Secondary | ICD-10-CM

## 2020-11-16 MED ORDER — SULFAMETHOXAZOLE-TRIMETHOPRIM 800-160 MG PO TABS: 1.0000 | ORAL_TABLET | Freq: Every day | ORAL | 3 refills | Status: AC

## 2020-11-16 MED ORDER — NYSTATIN 100000 UNIT/ML MT SUSP: 500000.0000 [IU] | Freq: Four times a day (QID) | OROMUCOSAL | 0 refills | Status: AC

## 2020-11-16 MED ORDER — PANTOPRAZOLE SODIUM 20 MG PO TBEC: 20.0000 mg | DELAYED_RELEASE_TABLET | Freq: Every day | ORAL | 0 refills | Status: DC

## 2020-11-16 MED ORDER — FLUCONAZOLE 200 MG PO TABS: 200.0000 mg | ORAL_TABLET | Freq: Every day | ORAL | 0 refills | Status: AC

## 2020-11-16 NOTE — Discharge Instructions (Addendum)
Because of your low CD4 count, you will need to start taking Bactrim, 1 tablet a day.  This is an antibiotic that will help prevent bacterial infections.  Take Protonix once a day to help with acid reflux  The difficulty swallowing and the pain in your throat and neck is likely related to a disease called esophagitis, this is likely related to an infection from a fungus, I have prescribed 2 different medications one is a pill and one is a liquid, take them together daily, return to the ER for severe or worsening symptoms  You will need to see the ID clinic - this week in close follow up

## 2020-11-16 NOTE — ED Triage Notes (Signed)
Pt with sore throat x 2 months. Has been seen for same with negative strep and covid tests.

## 2020-11-16 NOTE — ED Provider Notes (Addendum)
[facility] EMERGENCY DEPARTMENT Provider Note   CSN: [id number] Arrival date & time: 11/16/20  0757     History Chief Complaint  Patient presents with  . Sore Throat    Jane Estrada is a 44 y.o. female.  HPI   This patient is a 44 year old female, she has multiple medical problems including HIV infection with a very low CD4 count, she has a history of bipolar disorder, drug abuse including crack cocaine, congestive heart failure and continues to smoke both tobacco and marijuana.  She presents with a complaint of pain with swallowing, states the pain is in the back of the throat but extends down into the upper chest, it has been present for months, seems to get better and worse at different times, states that she lost her medications and has been without them for quite some time, her last visit 2 months ago was for a sore throat with a negative strep test.  She does not have any fevers, she does not have any nausea or vomiting, she is just tired of the pain with swallowing.  She has not seen infectious disease clinic in quite some time  Past Medical History:  Diagnosis Date  . Bipolar affective ([place])   . Chronic systolic CHF (congestive heart failure) ([place])   . Granular cell tumor   . HIV (human immunodeficiency virus infection) ([place])   . Hypertension   . Myocardial infarct ([place]) 2006  . Tobacco dependence     Patient Active Problem List   Diagnosis Date Noted  . Chest pain 03/15/2020  . Polysubstance abuse ([place]) 03/15/2020  . Pancytopenia ([place]) 03/15/2020  . 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 Tobacco dependence   . Hypertension   . HIV (human immunodeficiency virus infection) ([place])   . Granular cell tumor   . Chronic systolic CHF (congestive heart failure) ([place])   . Bipolar affective ([place])   . History of myocardial infarction 2006    Past Surgical History:  Procedure Laterality Date  . none       OB History   No  obstetric history on file.     Family History  Problem Relation Age of Onset  . Hypertension Mother   . Cancer Father     Social History   Tobacco Use  . Smoking status: Current Every Day Smoker    Packs/day: 0.50    Types: Cigarettes  . Smokeless tobacco: Never Used  Vaping Use  . Vaping Use: Never used  Substance Use Topics  . Alcohol use: Not Currently  . Drug use: Yes    Frequency: 1.0 times per week    Types: Marijuana, Amphetamines    Home Medications Prior to Admission medications   Medication Sig Start Date End Date Taking? Authorizing Provider  cefpodoxime (VANTIN) 200 MG tablet Take 1 tablet (200 mg total) by mouth 2 (two) times daily. 06/19/20   Petrucelli, Samantha R, PA-C  fluconazole (DIFLUCAN) 200 MG tablet Take 1 tablet (200 mg total) by mouth daily for 7 days. 11/16/20 11/23/20  Noemi Chapel, MD  methocarbamol (ROBAXIN) 500 MG tablet Take 1 tablet (500 mg total) by mouth every 8 (eight) hours as needed for muscle spasms. 06/19/20   Petrucelli, Samantha R, PA-C  nystatin (MYCOSTATIN) 100000 UNIT/ML suspension Take 5 mLs (500,000 Units total) by mouth 4 (four) times daily for 15 days. 11/16/20 12/01/20  Noemi Chapel, MD  ondansetron (ZOFRAN ODT) 4 MG disintegrating tablet Take 1 tablet (4 mg total) by mouth every  8 (eight) hours as needed for nausea or vomiting. 05/24/20   Charlann Lange, PA-C  pantoprazole (PROTONIX) 20 MG tablet Take 1 tablet (20 mg total) by mouth daily. 11/16/20   Noemi Chapel, MD  phenazopyridine (PYRIDIUM) 200 MG tablet Take 1 tablet (200 mg total) by mouth 3 (three) times daily as needed for pain. 06/19/20   Petrucelli, Samantha R, PA-C  sulfamethoxazole-trimethoprim (BACTRIM DS) 800-160 MG tablet Take 1 tablet by mouth daily. 11/16/20 12/16/20  Noemi Chapel, MD  amLODipine (NORVASC) 10 MG tablet Take 1 tablet (10 mg total) by mouth daily. Patient not taking: Reported on 05/24/2020 03/17/20 06/19/20  Geradine Girt, DO  hydrALAZINE (APRESOLINE) 25  MG tablet Take 1 tablet (25 mg total) by mouth every 8 (eight) hours. Patient not taking: Reported on 06/18/2020 05/24/20 06/19/20  Charlann Lange, PA-C  metoprolol tartrate (LOPRESSOR) 25 MG tablet Take 0.5 tablets (12.5 mg total) by mouth 2 (two) times daily. Patient not taking: Reported on 05/24/2020 03/17/20 06/19/20  Geradine Girt, DO    Allergies    Lisinopril, Remeron [mirtazapine], and Abilify [aripiprazole]  Review of Systems   Review of Systems  Constitutional: Negative for fever.  HENT: Positive for sore throat.   Respiratory: Negative for cough and shortness of breath.   Cardiovascular: Negative for chest pain.  Gastrointestinal: Negative for nausea and vomiting.  Skin: Negative for rash.    Physical Exam Updated Vital Signs BP (!) 148/116 (BP Location: Right Arm)   Pulse 90   Temp (!) 97.4 F (36.3 C) (Oral)   Resp 16   SpO2 100%   Physical Exam Vitals and nursing note reviewed.  Constitutional:      Appearance: She is well-developed and well-nourished. She is not diaphoretic.  HENT:     Head: Normocephalic and atraumatic.     Mouth/Throat:     Comments: Normal-appearing pharynx, there is no exudate asymmetry or hypertrophy, normal phonation Eyes:     General:        Right eye: No discharge.        Left eye: No discharge.     Conjunctiva/sclera: Conjunctivae normal.  Neck:     Thyroid: No thyromegaly.  Cardiovascular:     Rate and Rhythm: Normal rate and regular rhythm.  Pulmonary:     Effort: Pulmonary effort is normal. No respiratory distress.  Musculoskeletal:     Cervical back: Normal range of motion and neck supple.  Lymphadenopathy:     Cervical: No cervical adenopathy.  Skin:    General: Skin is warm and dry.     Findings: No erythema or rash.  Neurological:     General: No focal deficit present.     Mental Status: She is alert.     Coordination: Coordination normal.  Psychiatric:        Mood and Affect: Mood and affect normal.     ED  Results / Procedures / Treatments   Labs (all labs ordered are listed, but only abnormal results are displayed) Labs Reviewed - No data to display  EKG None  Radiology No results found.  Procedures Procedures (including critical care time)  Medications Ordered in ED Medications - No data to display  ED Course  I have reviewed the triage vital signs and the nursing notes.  Pertinent labs & imaging results that were available during my care of the patient were reviewed by me and considered in my medical decision making (see chart for details).    MDM Rules/Calculators/A&P  The patient's vital signs were reviewed, she is hypertensive but minimally more on the diastolic, she is afebrile and has a normal heart rate, the back of her pharynx is normal-appearing with no erythema exudate asymmetry or hypertrophy, phonation is normal.  She does look like she has some discomfort with swallowing which I suspect is related to candidiasis most likely esophagitis.  At the time of this evaluation the patient was informed of her low CD4 count, she will be treated both prophylactically with daily Bactrim as well as with Diflucan and nystatin oral to treat for possible esophageal candidiasis.  She has been referred back to the infectious disease clinic, she is agreeable to the disposition  Final Clinical Impression(s) / ED Diagnoses Final diagnoses:  Esophagitis  Symptomatic HIV infection ([place])    Rx / DC Orders ED Discharge Orders         Ordered    sulfamethoxazole-trimethoprim (BACTRIM DS) 800-160 MG tablet  Daily        11/16/20 1149    fluconazole (DIFLUCAN) 200 MG tablet  Daily        11/16/20 1149    nystatin (MYCOSTATIN) 100000 UNIT/ML suspension  4 times daily        11/16/20 1149    pantoprazole (PROTONIX) 20 MG tablet  Daily        11/16/20 1151           Noemi Chapel, MD 11/16/20 1154    Noemi Chapel, MD 12/23/20 [phone]

## 2020-11-30 ENCOUNTER — Other Ambulatory Visit: Payer: Self-pay

## 2020-11-30 ENCOUNTER — Emergency Department (HOSPITAL_COMMUNITY)
Admission: EM | Admit: 2020-11-30 | Discharge: 2020-11-30 | Disposition: A | Discharge status: Home / Self Care | Payer: Medicaid - Out of State | Attending: Emergency Medicine | Admitting: Emergency Medicine

## 2020-11-30 ENCOUNTER — Encounter (HOSPITAL_COMMUNITY): Payer: Self-pay

## 2020-11-30 ENCOUNTER — Emergency Department (HOSPITAL_COMMUNITY): Payer: Medicaid - Out of State

## 2020-11-30 DIAGNOSIS — I10 Essential (primary) hypertension: Secondary | ICD-10-CM

## 2020-11-30 DIAGNOSIS — Z20822 Contact with and (suspected) exposure to covid-19: Secondary | ICD-10-CM | POA: Insufficient documentation

## 2020-11-30 DIAGNOSIS — Z79899 Other long term (current) drug therapy: Secondary | ICD-10-CM | POA: Diagnosis not present

## 2020-11-30 DIAGNOSIS — I110 Hypertensive heart disease with heart failure: Secondary | ICD-10-CM | POA: Insufficient documentation

## 2020-11-30 DIAGNOSIS — B20 Human immunodeficiency virus [HIV] disease: Secondary | ICD-10-CM | POA: Diagnosis not present

## 2020-11-30 DIAGNOSIS — I5022 Chronic systolic (congestive) heart failure: Secondary | ICD-10-CM | POA: Diagnosis not present

## 2020-11-30 DIAGNOSIS — F17210 Nicotine dependence, cigarettes, uncomplicated: Secondary | ICD-10-CM | POA: Diagnosis not present

## 2020-11-30 DIAGNOSIS — R0789 Other chest pain: Secondary | ICD-10-CM | POA: Insufficient documentation

## 2020-11-30 DIAGNOSIS — R079 Chest pain, unspecified: Secondary | ICD-10-CM

## 2020-11-30 LAB — CBC
HCT: 32.1 % — ABNORMAL LOW (ref 36.0–46.0)
Hemoglobin: 9.8 g/dL — ABNORMAL LOW (ref 12.0–15.0)
MCH: 27.1 pg (ref 26.0–34.0)
MCHC: 30.5 g/dL (ref 30.0–36.0)
MCV: 88.9 fL (ref 80.0–100.0)
Platelets: 65 10*3/uL — ABNORMAL LOW (ref 150–400)
RBC: 3.61 MIL/uL — ABNORMAL LOW (ref 3.87–5.11)
RDW: 16.2 % — ABNORMAL HIGH (ref 11.5–15.5)
WBC: 2 10*3/uL — ABNORMAL LOW (ref 4.0–10.5)
nRBC: 0 % (ref 0.0–0.2)

## 2020-11-30 LAB — TROPONIN I (HIGH SENSITIVITY)
Troponin I (High Sensitivity): 38 ng/L — ABNORMAL HIGH (ref <18)
Troponin I (High Sensitivity): 33 ng/L — ABNORMAL HIGH (ref <18)

## 2020-11-30 LAB — BASIC METABOLIC PANEL
Anion gap: 12 (ref 5–15)
BUN: 20 mg/dL (ref 6–20)
CO2: 20 mmol/L — ABNORMAL LOW (ref 22–32)
Calcium: 9.4 mg/dL (ref 8.9–10.3)
Chloride: 103 mmol/L (ref 98–111)
Creatinine, Ser: 1.17 mg/dL — ABNORMAL HIGH (ref 0.44–1.00)
GFR, Estimated: 59 mL/min — ABNORMAL LOW (ref >60)
Glucose, Bld: 99 mg/dL (ref 70–99)
Potassium: 4.3 mmol/L (ref 3.5–5.1)
Sodium: 135 mmol/L (ref 135–145)

## 2020-11-30 LAB — RESP PANEL BY RT-PCR (FLU A&B, COVID) ARPGX2
Influenza A by PCR: NEGATIVE
Influenza B by PCR: NEGATIVE
SARS Coronavirus 2 by RT PCR: NEGATIVE

## 2020-11-30 LAB — I-STAT BETA HCG BLOOD, ED (MC, WL, AP ONLY): I-stat hCG, quantitative: <5 m[IU]/mL (ref <5)

## 2020-11-30 LAB — CBG MONITORING, ED: Glucose-Capillary: 84 mg/dL (ref 70–99)

## 2020-11-30 MED ORDER — HYDRALAZINE HCL 25 MG PO TABS: 25.0000 mg | ORAL_TABLET | Freq: Three times a day (TID) | ORAL | 0 refills | Status: DC

## 2020-11-30 MED ORDER — AMLODIPINE BESYLATE 10 MG PO TABS: 10.0000 mg | ORAL_TABLET | Freq: Every day | ORAL | 0 refills | Status: DC

## 2020-11-30 MED ORDER — ASPIRIN 81 MG PO CHEW: 324.0000 mg | CHEWABLE_TABLET | Freq: Once | ORAL | Status: DC

## 2020-11-30 MED ORDER — MORPHINE SULFATE (PF) 4 MG/ML IV SOLN
4.0000 mg | Freq: Once | INTRAVENOUS | Status: AC
  Administered 2020-11-30: 4 mg via INTRAVENOUS
  Filled 2020-11-30: qty 1

## 2020-11-30 MED ORDER — AMLODIPINE BESYLATE 5 MG PO TABS
10.0000 mg | ORAL_TABLET | Freq: Once | ORAL | Status: AC
  Administered 2020-11-30: 10 mg via ORAL
  Filled 2020-11-30: qty 2

## 2020-11-30 MED ORDER — METOPROLOL TARTRATE 25 MG PO TABS: 12.5000 mg | ORAL_TABLET | Freq: Two times a day (BID) | ORAL | 1 refills | Status: DC

## 2020-11-30 MED ORDER — HYDRALAZINE HCL 25 MG PO TABS
25.0000 mg | ORAL_TABLET | Freq: Once | ORAL | Status: AC
  Administered 2020-11-30: 25 mg via ORAL
  Filled 2020-11-30: qty 1

## 2020-11-30 NOTE — Discharge Instructions (Addendum)
Make sure to start taking your blood pressure medications again.  Follow-up with your primary care doctor to recheck on your blood pressure.  Return as needed for worsening symptoms

## 2020-11-30 NOTE — ED Notes (Signed)
Pt endorses being given 4 ASA PTA; Dr. Tomi Bamberger aware

## 2020-11-30 NOTE — ED Provider Notes (Addendum)
[facility] EMERGENCY DEPARTMENT Provider Note   CSN: [id number] Arrival date & time: 11/30/20  [id number]     History Chief Complaint  Patient presents with  . Chest Pain    Jane Estrada is a 44 y.o. female.  HPI  HPI: A 44 year old patient with a history of CVA and hypertension presents for evaluation of chest pain. Initial onset of pain was approximately 3-6 hours ago. The patient's chest pain is described as heaviness/pressure/tightness, is sharp and is not worse with exertion. The patient's chest pain is middle- or left-sided, is not well-localized and does radiate to the arms/jaw/neck. The patient does not complain of nausea and denies diaphoresis. The patient has smoked in the past 90 days. The patient has no history of peripheral artery disease, denies any history of treated diabetes, has no relevant family history of coronary artery disease (first degree relative at less than age 67), has no history of hypercholesterolemia and does not have an elevated BMI (>=30). Patient presents to the ED for evaluation of chest pain and coughing.  Patient states she started having sharp bilateral chest pain as well as some tightness in the left side of her chest.  Started this morning about 3 AM.  Patient also has been coughing a lot.  The cough is nonproductive.  She has had some myalgias.  She is not sure if she has had a fever but has not A[place] her temperature.  Patient does have a history of hypertension but has not been taking her medications.  She was living in [place] and now lives in [place].  Patient states she has had heart attacks and strokes when she was living in [place].  Patient never has had any cardiac stents.  Medical records reviewed from [place] in the EMR indicate that patient does have history of nonischemic cardiomyopathy as well as HIV infection, hypertension and stroke.  Past Medical History:  Diagnosis Date  . Bipolar affective ([place])   . Chronic systolic CHF  (congestive heart failure) ([place])   . Granular cell tumor   . HIV (human immunodeficiency virus infection) ([place])   . Hypertension   . Myocardial infarct ([place]) 2006  . Tobacco dependence     Patient Active Problem List   Diagnosis Date Noted  . Chest pain 03/15/2020  . Polysubstance abuse ([place]) 03/15/2020  . Pancytopenia ([place]) 03/15/2020  . 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 Tobacco dependence   . Hypertension   . HIV (human immunodeficiency virus infection) ([place])   . Granular cell tumor   . Chronic systolic CHF (congestive heart failure) ([place])   . Bipolar affective ([place])   . History of myocardial infarction 2006    Past Surgical History:  Procedure Laterality Date  . none       OB History   No obstetric history on file.     Family History  Problem Relation Age of Onset  . Hypertension Mother   . Cancer Father     Social History   Tobacco Use  . Smoking status: Current Every Day Smoker    Packs/day: 0.50    Types: Cigarettes  . Smokeless tobacco: Never Used  Vaping Use  . Vaping Use: Never used  Substance Use Topics  . Alcohol use: Not Currently  . Drug use: Yes    Frequency: 1.0 times per week    Types: Marijuana, Amphetamines    Home Medications Prior to Admission medications   Medication Sig Start Date End Date Taking? Authorizing  Provider  amLODipine (NORVASC) 10 MG tablet Take 1 tablet (10 mg total) by mouth daily. 11/30/20 12/30/20  Dorie Rank, MD  cefpodoxime (VANTIN) 200 MG tablet Take 1 tablet (200 mg total) by mouth 2 (two) times daily. 06/19/20   Petrucelli, Samantha R, PA-C  hydrALAZINE (APRESOLINE) 25 MG tablet Take 1 tablet (25 mg total) by mouth 3 (three) times daily. 11/30/20 12/30/20  Dorie Rank, MD  methocarbamol (ROBAXIN) 500 MG tablet Take 1 tablet (500 mg total) by mouth every 8 (eight) hours as needed for muscle spasms. 06/19/20   Petrucelli, Samantha R, PA-C  metoprolol tartrate (LOPRESSOR) 25 MG tablet Take 0.5  tablets (12.5 mg total) by mouth 2 (two) times daily. 11/30/20   Dorie Rank, MD  nystatin (MYCOSTATIN) 100000 UNIT/ML suspension Take 5 mLs (500,000 Units total) by mouth 4 (four) times daily for 15 days. 11/16/20 12/01/20  Noemi Chapel, MD  ondansetron (ZOFRAN ODT) 4 MG disintegrating tablet Take 1 tablet (4 mg total) by mouth every 8 (eight) hours as needed for nausea or vomiting. 05/24/20   Charlann Lange, PA-C  pantoprazole (PROTONIX) 20 MG tablet Take 1 tablet (20 mg total) by mouth daily. 11/16/20   Noemi Chapel, MD  phenazopyridine (PYRIDIUM) 200 MG tablet Take 1 tablet (200 mg total) by mouth 3 (three) times daily as needed for pain. 06/19/20   Petrucelli, Samantha R, PA-C  sulfamethoxazole-trimethoprim (BACTRIM DS) 800-160 MG tablet Take 1 tablet by mouth daily. 11/16/20 12/16/20  Noemi Chapel, MD    Allergies    Lisinopril, Remeron [mirtazapine], and Abilify [aripiprazole]  Review of Systems   Review of Systems  All other systems reviewed and are negative.   Physical Exam Updated Vital Signs BP (!) 152/127   Pulse 86   Temp 98.1 F (36.7 C) (Oral)   Resp (!) 0   Ht 1.626 m (5\' 4" )   Wt 59 kg   SpO2 100%   BMI 22.31 kg/m   Physical Exam Vitals and nursing note reviewed.  Constitutional:      Appearance: She is well-developed and well-nourished. She is not ill-appearing or diaphoretic.  HENT:     Head: Normocephalic and atraumatic.     Right Ear: External ear normal.     Left Ear: External ear normal.  Eyes:     General: No scleral icterus.       Right eye: No discharge.        Left eye: No discharge.     Conjunctiva/sclera: Conjunctivae normal.  Neck:     Trachea: No tracheal deviation.  Cardiovascular:     Rate and Rhythm: Normal rate and regular rhythm.     Pulses: Intact distal pulses.  Pulmonary:     Effort: Pulmonary effort is normal. No respiratory distress.     Breath sounds: Normal breath sounds. No stridor. No wheezing or rales.  Abdominal:      General: Bowel sounds are normal. There is no distension.     Palpations: Abdomen is soft.     Tenderness: There is no abdominal tenderness. There is no guarding or rebound.  Musculoskeletal:        General: No tenderness or edema.     Cervical back: Neck supple.  Skin:    General: Skin is warm.     Findings: No rash.  Neurological:     Mental Status: She is alert.     Cranial Nerves: No cranial nerve deficit (no facial droop, extraocular movements intact, no slurred speech).  Sensory: No sensory deficit.     Motor: No abnormal muscle tone or seizure activity.     Coordination: Coordination normal.     Deep Tendon Reflexes: Strength normal.  Psychiatric:        Mood and Affect: Mood and affect normal.     ED Results / Procedures / Treatments   Labs (all labs ordered are listed, but only abnormal results are displayed) Labs Reviewed  BASIC METABOLIC PANEL - Abnormal; Notable for the following components:      Result Value   CO2 20 (*)    Creatinine, Ser 1.17 (*)    GFR, Estimated 59 (*)    All other components within normal limits  CBC - Abnormal; Notable for the following components:   WBC 2.0 (*)    RBC 3.61 (*)    Hemoglobin 9.8 (*)    HCT 32.1 (*)    RDW 16.2 (*)    Platelets 65 (*)    All other components within normal limits  TROPONIN I (HIGH SENSITIVITY) - Abnormal; Notable for the following components:   Troponin I (High Sensitivity) 38 (*)    All other components within normal limits  TROPONIN I (HIGH SENSITIVITY) - Abnormal; Notable for the following components:   Troponin I (High Sensitivity) 33 (*)    All other components within normal limits  RESP PANEL BY RT-PCR (FLU A&B, COVID) ARPGX2  CBG MONITORING, ED  I-STAT BETA HCG BLOOD, ED ([facility], [facility], [facility] ONLY)    EKG EKG Interpretation  Date/Time:  Tuesday November 30 2020 10:03:55 EST Ventricular Rate:  91 PR Interval:    QRS Duration: 123 QT Interval:  406 QTC Calculation: 500 R Axis:   84 Text  Interpretation: Sinus rhythm Probable left atrial enlargement LVH with IVCD and secondary repol abnrm Anterior ST elevation, probably due to LVH Borderline prolonged QT interval No significant change since last tracing Confirmed by Dorie Rank [phone]) on 11/30/2020 10:05:10 AM   Radiology DG Chest Portable 1 View  Result Date: 11/30/2020 CLINICAL DATA:  Chest pain, cough EXAM: PORTABLE CHEST 1 VIEW COMPARISON:  08/12/2020 FINDINGS: Cardiomegaly. Both lungs are clear. The visualized skeletal structures are unremarkable. IMPRESSION: Cardiomegaly without acute abnormality of the lungs in AP portable projection. Electronically Signed   By: Eddie Candle M.D.   On: 11/30/2020 10:20    Procedures Procedures (including critical care time)  Medications Ordered in ED Medications  morphine 4 MG/ML injection 4 mg (4 mg Intravenous Given 11/30/20 1021)  amLODipine (NORVASC) tablet 10 mg (10 mg Oral Given 11/30/20 1133)  hydrALAZINE (APRESOLINE) tablet 25 mg (25 mg Oral Given 11/30/20 1133)    ED Course  I have reviewed the triage vital signs and the nursing notes.  Pertinent labs & imaging results that were available during my care of the patient were reviewed by me and considered in my medical decision making (see chart for details).  Clinical Course as of 11/30/20 1345  Tue Nov 30, 2020  5784 ASA order cancelled.  Pt was given asa by EMS [JK]  1122 Covid and flu test are negative [JK]  6962 Blood blood cell count is low but stable.  Anemia stable [JK]  1123 Chest x-ray without acute findings [JK]  1125 Patient still hypertensive.  Previous records reviewed.  Patient had been on Norvasc 10 mg, hydralazine 25 mg and Lopressor.  Will give a dose of the Norvasc and hydralazine right now [JK]  1214 Initial troponin elevated at 38 but similar to previous  values [JK]  1321 Delta troponin unchanged [JK]    Clinical Course User Index [JK] Dorie Rank, MD   MDM Rules/Calculators/A&P HEAR Score: 4                         Patient presented to the ED for evaluation of chest pain.  Patient had some respiratory symptoms.  Comes concerned about the possibility of pneumonia as well as covid and influenza.  Covid influenza test were negative.  No evidence of pneumonia or pneumothorax on chest x-ray.  Patient does have history of hypertension and heart disease.  Patient does have elevated troponins but her delta has not changed and this is actually similar to previous values.  I suspect this is more related to her uncontrolled hypertension. Final Clinical Impression(s) / ED Diagnoses Final diagnoses:  Nonspecific chest pain  Hypertension, unspecified type    Rx / DC Orders ED Discharge Orders         Ordered    metoprolol tartrate (LOPRESSOR) 25 MG tablet  2 times daily        11/30/20 1344    hydrALAZINE (APRESOLINE) 25 MG tablet  3 times daily        11/30/20 1344    amLODipine (NORVASC) 10 MG tablet  Daily        11/30/20 1344           Dorie Rank, MD 12/01/20 0612 Missing documentation added   Dorie Rank, MD 12/13/20 1715

## 2020-11-30 NOTE — ED Triage Notes (Signed)
Pt BIB ems; c/o cp around 0300 this am, woke from sleep; L sided, tightness; hx HTN, CHF; also c/o nonproductive cough x 5 days, HA; has not had covid vaccine; on abx now for esophageal infection; hypertensive, not compliant with bp meds  169/111 T 98.2 f Hr 90, some slight depression noted in V6 RR 20 cbg 157

## 2020-12-02 ENCOUNTER — Emergency Department (HOSPITAL_COMMUNITY)
Admission: EM | Admit: 2020-12-02 | Discharge: 2020-12-02 | Disposition: A | Discharge status: Home / Self Care | Payer: Medicaid - Out of State | Attending: Emergency Medicine | Admitting: Emergency Medicine

## 2020-12-02 ENCOUNTER — Telehealth: Payer: Self-pay

## 2020-12-02 ENCOUNTER — Encounter (HOSPITAL_COMMUNITY): Payer: Self-pay | Admitting: Emergency Medicine

## 2020-12-02 ENCOUNTER — Other Ambulatory Visit: Payer: Self-pay

## 2020-12-02 DIAGNOSIS — I10 Essential (primary) hypertension: Secondary | ICD-10-CM | POA: Insufficient documentation

## 2020-12-02 DIAGNOSIS — Z9119 Patient's noncompliance with other medical treatment and regimen: Secondary | ICD-10-CM | POA: Insufficient documentation

## 2020-12-02 DIAGNOSIS — R519 Headache, unspecified: Secondary | ICD-10-CM | POA: Insufficient documentation

## 2020-12-02 DIAGNOSIS — Z5321 Procedure and treatment not carried out due to patient leaving prior to being seen by health care provider: Secondary | ICD-10-CM | POA: Diagnosis not present

## 2020-12-02 NOTE — ED Triage Notes (Signed)
Pt BIB [facility] from home, pt states when she was covid tested on 12/21, she felt a pain in the left side of her head and has had a headache since. Also reports that sometimes she "sees crooked", but can clear it by closing and opening her eyes. Hx HTN, non-compliant with meds.

## 2020-12-02 NOTE — ED Notes (Signed)
Pt called 3x no answer  

## 2020-12-02 NOTE — Telephone Encounter (Signed)
Patient called in and stating that she was in her car outside the [facility] ED. Patient states that she can't see and that her chest pain was becoming worse. Patient is already check in to be seen. I spoke with charge nurse Obie Dredge and she was going to go outside and try to find the patient.

## 2020-12-06 ENCOUNTER — Encounter (HOSPITAL_COMMUNITY): Payer: Self-pay

## 2020-12-06 ENCOUNTER — Emergency Department (HOSPITAL_COMMUNITY)
Admission: EM | Admit: 2020-12-06 | Discharge: 2020-12-06 | Disposition: A | Discharge status: Home / Self Care | Payer: Medicaid - Out of State | Attending: Emergency Medicine | Admitting: Emergency Medicine

## 2020-12-06 ENCOUNTER — Other Ambulatory Visit: Payer: Self-pay

## 2020-12-06 ENCOUNTER — Emergency Department (HOSPITAL_COMMUNITY): Payer: Medicaid - Out of State

## 2020-12-06 DIAGNOSIS — R790 Abnormal level of blood mineral: Secondary | ICD-10-CM | POA: Diagnosis not present

## 2020-12-06 DIAGNOSIS — F17210 Nicotine dependence, cigarettes, uncomplicated: Secondary | ICD-10-CM | POA: Diagnosis not present

## 2020-12-06 DIAGNOSIS — R0602 Shortness of breath: Secondary | ICD-10-CM | POA: Diagnosis present

## 2020-12-06 DIAGNOSIS — J9801 Acute bronchospasm: Secondary | ICD-10-CM | POA: Insufficient documentation

## 2020-12-06 DIAGNOSIS — I110 Hypertensive heart disease with heart failure: Secondary | ICD-10-CM | POA: Insufficient documentation

## 2020-12-06 DIAGNOSIS — R7989 Other specified abnormal findings of blood chemistry: Secondary | ICD-10-CM

## 2020-12-06 DIAGNOSIS — Z79899 Other long term (current) drug therapy: Secondary | ICD-10-CM | POA: Insufficient documentation

## 2020-12-06 DIAGNOSIS — I509 Heart failure, unspecified: Secondary | ICD-10-CM

## 2020-12-06 DIAGNOSIS — I5022 Chronic systolic (congestive) heart failure: Secondary | ICD-10-CM | POA: Diagnosis not present

## 2020-12-06 DIAGNOSIS — R778 Other specified abnormalities of plasma proteins: Secondary | ICD-10-CM | POA: Insufficient documentation

## 2020-12-06 DIAGNOSIS — D61818 Other pancytopenia: Secondary | ICD-10-CM

## 2020-12-06 LAB — CBC WITH DIFFERENTIAL/PLATELET
Abs Immature Granulocytes: 0.01 10*3/uL (ref 0.00–0.07)
Basophils Absolute: 0 10*3/uL (ref 0.0–0.1)
Basophils Relative: 0 %
Eosinophils Absolute: 0 10*3/uL (ref 0.0–0.5)
Eosinophils Relative: 1 %
HCT: 28.7 % — ABNORMAL LOW (ref 36.0–46.0)
Hemoglobin: 8.6 g/dL — ABNORMAL LOW (ref 12.0–15.0)
Immature Granulocytes: 0 %
Lymphocytes Relative: 12 %
Lymphs Abs: 0.3 10*3/uL — ABNORMAL LOW (ref 0.7–4.0)
MCH: 27.6 pg (ref 26.0–34.0)
MCHC: 30 g/dL (ref 30.0–36.0)
MCV: 92 fL (ref 80.0–100.0)
Monocytes Absolute: 0.2 10*3/uL (ref 0.1–1.0)
Monocytes Relative: 7 %
Neutro Abs: 1.9 10*3/uL (ref 1.7–7.7)
Neutrophils Relative %: 80 %
Platelets: 123 10*3/uL — ABNORMAL LOW (ref 150–400)
RBC: 3.12 MIL/uL — ABNORMAL LOW (ref 3.87–5.11)
RDW: 17 % — ABNORMAL HIGH (ref 11.5–15.5)
WBC: 2.4 10*3/uL — ABNORMAL LOW (ref 4.0–10.5)
nRBC: 0 % (ref 0.0–0.2)

## 2020-12-06 LAB — BASIC METABOLIC PANEL
Anion gap: 11 (ref 5–15)
BUN: 23 mg/dL — ABNORMAL HIGH (ref 6–20)
CO2: 22 mmol/L (ref 22–32)
Calcium: 9.3 mg/dL (ref 8.9–10.3)
Chloride: 106 mmol/L (ref 98–111)
Creatinine, Ser: 0.95 mg/dL (ref 0.44–1.00)
GFR, Estimated: >60 mL/min (ref >60)
Glucose, Bld: 103 mg/dL — ABNORMAL HIGH (ref 70–99)
Potassium: 4 mmol/L (ref 3.5–5.1)
Sodium: 139 mmol/L (ref 135–145)

## 2020-12-06 LAB — TROPONIN I (HIGH SENSITIVITY)
Troponin I (High Sensitivity): 35 ng/L — ABNORMAL HIGH (ref <18)
Troponin I (High Sensitivity): 30 ng/L — ABNORMAL HIGH (ref <18)

## 2020-12-06 LAB — BRAIN NATRIURETIC PEPTIDE: B Natriuretic Peptide: 788.9 pg/mL — ABNORMAL HIGH (ref 0.0–100.0)

## 2020-12-06 MED ORDER — FUROSEMIDE 10 MG/ML IJ SOLN
40.0000 mg | Freq: Once | INTRAMUSCULAR | Status: AC
  Administered 2020-12-06: 40 mg via INTRAVENOUS
  Filled 2020-12-06: qty 4

## 2020-12-06 MED ORDER — PREDNISONE 20 MG PO TABS
60.0000 mg | ORAL_TABLET | Freq: Once | ORAL | Status: AC
  Administered 2020-12-06: 60 mg via ORAL
  Filled 2020-12-06: qty 3

## 2020-12-06 MED ORDER — FUROSEMIDE 20 MG PO TABS: 20.0000 mg | ORAL_TABLET | Freq: Every day | ORAL | 0 refills | Status: DC

## 2020-12-06 MED ORDER — ALBUTEROL SULFATE HFA 108 (90 BASE) MCG/ACT IN AERS
2.0000 | INHALATION_SPRAY | Freq: Once | RESPIRATORY_TRACT | Status: AC
  Administered 2020-12-06: 2 via RESPIRATORY_TRACT
  Filled 2020-12-06: qty 6.7

## 2020-12-06 NOTE — ED Triage Notes (Signed)
Per ems they were called out for Sob that started yesterday that's been getting prog worse. Pt has hx of CHF. Per ems pt has rales on her L side. Pt alert and oriented x4. Pt also c/o headache

## 2020-12-06 NOTE — Discharge Instructions (Addendum)
You have been seen and discharged from the emergency department.  Follow-up with your primary provider for reevaluation. Take new prescriptions and home medications as prescribed. If you have any worsening symptoms or further concerns for health please return to emergency department for further evaluation. 

## 2020-12-06 NOTE — ED Notes (Signed)
Pt continues to state that she will not allow the Covid swab because the last time, it made her go "cross-eyed". Provider aware.

## 2020-12-06 NOTE — ED Notes (Signed)
Pt is resting with eyes closed. Rise and fall of chest visible.

## 2020-12-06 NOTE — ED Provider Notes (Signed)
[facility] DEPARTMENT Provider Note   CSN: [id number] Arrival date & time: 12/06/20  0240   History Chief Complaint  Patient presents with  . Shortness of Breath    Jane Estrada is a 44 y.o. female.  The history is provided by the patient.  Shortness of Breath She has history of hypertension, HIV, systolic heart failure and comes in because of shortness of breath.  She states that she has been having difficulty with shortness of breath since moving to [place] in April.  However, last night she woke up during the night with difficulty breathing and, again tonight, woke up with difficulty breathing.  Breathing was better during the day, but not at her baseline.  There has been a cough productive of some clear to white sputum.  She denies fever or chills.  She denies chest pain, heaviness, tightness, pressure.  She denies nausea, vomiting, diaphoresis.  She has not done anything to treat her symptoms at home.  She was in the ED several days ago for similar problems.  She denies any sick contacts.  She has not received the Covid vaccine.  Past Medical History:  Diagnosis Date  . Bipolar affective ([place])   . Chronic systolic CHF (congestive heart failure) ([place])   . Granular cell tumor   . HIV (human immunodeficiency virus infection) ([place])   . Hypertension   . Myocardial infarct ([place]) 2006  . Tobacco dependence     Patient Active Problem List   Diagnosis Date Noted  . Chest pain 03/15/2020  . Polysubstance abuse ([place]) 03/15/2020  . Pancytopenia ([place]) 03/15/2020  . 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 Tobacco dependence   . Hypertension   . HIV (human immunodeficiency virus infection) ([place])   . Granular cell tumor   . Chronic systolic CHF (congestive heart failure) ([place])   . Bipolar affective ([place])   . History of myocardial infarction 2006    Past Surgical History:  Procedure Laterality Date  . none       OB History    No obstetric history on file.     Family History  Problem Relation Age of Onset  . Hypertension Mother   . Cancer Father     Social History   Tobacco Use  . Smoking status: Current Every Day Smoker    Packs/day: 0.50    Types: Cigarettes  . Smokeless tobacco: Never Used  Vaping Use  . Vaping Use: Never used  Substance Use Topics  . Alcohol use: Not Currently  . Drug use: Yes    Frequency: 1.0 times per week    Types: Marijuana, Amphetamines    Home Medications Prior to Admission medications   Medication Sig Start Date End Date Taking? Authorizing Provider  ondansetron (ZOFRAN ODT) 4 MG disintegrating tablet Take 1 tablet (4 mg total) by mouth every 8 (eight) hours as needed for nausea or vomiting. 05/24/20  Yes Upstill, Shari, PA-C  sulfamethoxazole-trimethoprim (BACTRIM DS) 800-160 MG tablet Take 1 tablet by mouth daily. 11/16/20 12/16/20 Yes Noemi Chapel, MD  amLODipine (NORVASC) 10 MG tablet Take 1 tablet (10 mg total) by mouth daily. Patient not taking: Reported on 12/06/2020 11/30/20 12/30/20  Dorie Rank, MD  cefpodoxime (VANTIN) 200 MG tablet Take 1 tablet (200 mg total) by mouth 2 (two) times daily. Patient not taking: Reported on 12/06/2020 06/19/20   Petrucelli, Aldona Bar R, PA-C  hydrALAZINE (APRESOLINE) 25 MG tablet Take 1 tablet (25 mg total) by mouth 3 (three) times daily.  Patient not taking: Reported on 12/06/2020 11/30/20 12/30/20  Linwood Dibbles, MD  methocarbamol (ROBAXIN) 500 MG tablet Take 1 tablet (500 mg total) by mouth every 8 (eight) hours as needed for muscle spasms. Patient not taking: Reported on 12/06/2020 06/19/20   Petrucelli, Lelon Mast R, PA-C  metoprolol tartrate (LOPRESSOR) 25 MG tablet Take 0.5 tablets (12.5 mg total) by mouth 2 (two) times daily. Patient not taking: Reported on 12/06/2020 11/30/20   Linwood Dibbles, MD  pantoprazole (PROTONIX) 20 MG tablet Take 1 tablet (20 mg total) by mouth daily. Patient not taking: Reported on 12/06/2020 11/16/20    Eber Hong, MD  phenazopyridine (PYRIDIUM) 200 MG tablet Take 1 tablet (200 mg total) by mouth 3 (three) times daily as needed for pain. Patient not taking: Reported on 12/06/2020 06/19/20   Petrucelli, Lelon Mast R, PA-C    Allergies    Lisinopril, Remeron [mirtazapine], and Abilify [aripiprazole]  Review of Systems   Review of Systems  Respiratory: Positive for shortness of breath.   All other systems reviewed and are negative.   Physical Exam Updated Vital Signs BP (!) 167/126   Pulse 99   Temp (!) 97.3 F (36.3 C) (Oral)   Resp (!) 23   SpO2 99%   Physical Exam Vitals and nursing note reviewed.   44 year old female, resting comfortably and in no acute distress. Vital signs are significant for elevated blood pressure and respiratory rate. Oxygen saturation is 99%, which is normal. Head is normocephalic and atraumatic. PERRLA, EOMI. Oropharynx is clear. Neck is nontender and supple without adenopathy or JVD. Back is nontender and there is no CVA tenderness. Lungs have scattered wheezes without rales or rhonchi. Chest is nontender. Heart has regular rate and rhythm without murmur. Abdomen is soft, flat, nontender without masses or hepatosplenomegaly and peristalsis is normoactive. Extremities have no cyanosis or edema, full range of motion is present. Skin is warm and dry without rash. Neurologic: Mental status is normal, cranial nerves are intact, there are no motor or sensory deficits.  ED Results / Procedures / Treatments   Labs (all labs ordered are listed, but only abnormal results are displayed) Labs Reviewed  BASIC METABOLIC PANEL - Abnormal; Notable for the following components:      Result Value   Glucose, Bld 103 (*)    BUN 23 (*)    All other components within normal limits  CBC WITH DIFFERENTIAL/PLATELET - Abnormal; Notable for the following components:   WBC 2.4 (*)    RBC 3.12 (*)    Hemoglobin 8.6 (*)    HCT 28.7 (*)    RDW 17.0 (*)    Platelets  123 (*)    Lymphs Abs 0.3 (*)    All other components within normal limits  BRAIN NATRIURETIC PEPTIDE - Abnormal; Notable for the following components:   B Natriuretic Peptide 788.9 (*)    All other components within normal limits  TROPONIN I (HIGH SENSITIVITY) - Abnormal; Notable for the following components:   Troponin I (High Sensitivity) 35 (*)    All other components within normal limits  RESP PANEL BY RT-PCR (FLU A&B, COVID) ARPGX2  TROPONIN I (HIGH SENSITIVITY)    EKG EKG Interpretation  Date/Time:  Monday December 06 2020 02:47:09 EST Ventricular Rate:  95 PR Interval:    QRS Duration: 123 QT Interval:  407 QTC Calculation: 512 R Axis:   84 Text Interpretation: Sinus rhythm Left atrial enlargement LVH with secondary repolarization abnormality Anterior Q waves, possibly due to LVH  Prolonged QT interval When compared with ECG of 11/30/2020, No significant change was found Confirmed by Dione Booze [phone]) on 12/06/2020 2:48:49 AM   Radiology DG Chest Port 1 View  Result Date: 12/06/2020 CLINICAL DATA:  Cough, shortness of breath, progressively worsening, history of CHF, left-sided rales EXAM: PORTABLE CHEST 1 VIEW COMPARISON:  Radiograph 11/30/2020 FINDINGS: Fine interstitial opacities are present bilaterally with some peripheral septal thickening and mild vascular congestion. No focal consolidative opacity. No pneumothorax. No effusion. Cardiomediastinal contours are unremarkable. No acute osseous or soft tissue abnormality. Telemetry leads overlie the chest. IMPRESSION: Features are compatible with CHF/volume overload including cardiomegaly, vascular congestion and findings most suggestive of interstitial edema in the absence of infectious symptoms. Electronically Signed   By: Kreg Shropshire M.D.   On: 12/06/2020 03:25    Procedures Procedures   Medications Ordered in ED Medications  albuterol (VENTOLIN HFA) 108 (90 Base) MCG/ACT inhaler 2 puff (has no administration in  time range)  predniSONE (DELTASONE) tablet 60 mg (has no administration in time range)    ED Course  I have reviewed the triage vital signs and the nursing notes.  Pertinent labs & imaging results that were available during my care of the patient were reviewed by me and considered in my medical decision making (see chart for details).  MDM Rules/Calculators/A&P Shortness of breath with evidence of bronchospasm but also with known history of heart failure.  Chest x-ray does appear to show signs of mild interstitial edema with significant cardiomegaly.  Old records reviewed showing she was in the ED on 12/21 with chest pain and mildly elevated troponin.  She is given albuterol with ipratropium and given initial dose of prednisone, will also check troponin and BNP.  Troponin is elevated to 35.  This is the range that she has been in recently.  Delta troponin is pending.  BNP is moderately elevated at 788.9.  This is just of of heart failure.  Patient was reevaluated and she is sleeping and maintaining good oxygen saturation and lungs are clear.  It is not clear how much of her dyspnea is related to bronchospasm and how much to heart failure.  She is given a dose of furosemide.  Pancytopenia is present with most values in similar range that they have been.  Hemoglobin is 1.2 g less than it was on 12/21, but the same as it was on 10/7 with no record of any blood transfusion having been given.  Case is signed out to Dr. Wilkie Aye.  Final Clinical Impression(s) / ED Diagnoses Final diagnoses:  Shortness of breath  Elevated brain natriuretic peptide (BNP) level  Elevated troponin  Pancytopenia (H[facility])    Rx / DC Orders ED Discharge Orders    None       Dione Booze, MD 12/06/20 [phone]

## 2020-12-06 NOTE — ED Provider Notes (Signed)
Patient signed out to me by Dr. Preston Fleeting at time of shift change.  Briefly this is a 44 year old female who presented to the emergency department with shortness of breath.  On initial evaluation by previous provider she was found to have wheezing and findings consistent with reactive airway disease.  BNP is slightly elevated, chest x-ray confirms interstitial edema consistent with mild CHF exacerbation. EKG is unchanged for her. Lower concern for PE given CXR findings and clinical picture consistent with CHF.  She is not hypoxic or requiring supplemental oxygen.  She has been treated and has improved.  Initial troponin is slightly elevated however in her baseline range.  We are pending repeat troponin, road test and hoping for discharge.     Physical Exam  BP (!) 173/131    Pulse 97    Temp (!) 97.3 F (36.3 C) (Oral)    Resp 20    SpO2 98%   Physical Exam  ED Course/Procedures     Procedures  MDM  Repeat troponin is baseline elevated but flat, decreased. Patients SOB has resolved, she is conversational on RA, ambulatory in the room with no respiratory symptoms. Will DC with short course of Lasix and she will follow up as an outpatient. She also requested clinic and ophtho information for re evaluation of getting contacts, this information will be included in DC papers.  Patient will be discharged and treated as an outpatient.  Discharge plan discussed, patient verbalizes understanding and agreement.       Rozelle Logan, DO 12/06/20 1200

## 2020-12-17 ENCOUNTER — Encounter (HOSPITAL_COMMUNITY): Payer: Self-pay

## 2020-12-17 ENCOUNTER — Emergency Department (HOSPITAL_COMMUNITY)
Admission: EM | Admit: 2020-12-17 | Discharge: 2020-12-18 | Disposition: A | Discharge status: Home / Self Care | Payer: Medicaid - Out of State | Attending: Emergency Medicine | Admitting: Emergency Medicine

## 2020-12-17 ENCOUNTER — Other Ambulatory Visit: Payer: Self-pay

## 2020-12-17 DIAGNOSIS — Z5321 Procedure and treatment not carried out due to patient leaving prior to being seen by health care provider: Secondary | ICD-10-CM | POA: Insufficient documentation

## 2020-12-17 DIAGNOSIS — R0602 Shortness of breath: Secondary | ICD-10-CM | POA: Insufficient documentation

## 2020-12-17 MED ORDER — ALBUTEROL SULFATE HFA 108 (90 BASE) MCG/ACT IN AERS: 2.0000 | INHALATION_SPRAY | RESPIRATORY_TRACT | Status: DC | PRN

## 2020-12-17 NOTE — ED Triage Notes (Signed)
Pt here for eval of continued [facility] after recent dx of CHF. Seen 12/27 for same, DC w 5 day supply of Lasix. Pt reports she's taken per orders but it hasn't helped. O2 100% on RA, no meds given PTA. Pt homeless, possibly contributing to SHOB/discomfort

## 2020-12-17 NOTE — ED Notes (Addendum)
Pt asked to place a mask over her nose and mouth while in triage. Pt became very upset stating that she is not going to put a mask on because she cannot breath. This writer attempted to explain to the pt that due to [place] this is a policy at the hospital. Pt was advise that there is covid patients waiting in the waiting room and we trying to minimize the spread. Pt stated that she does not care about COVID and that she is going to leave. Pt is refusing to place a mask over her nose and mouth and continues to refuse to allow this tech to take her oxygen level with a mask on. This writer told the pt that if her oxygen is low, we can provide her with supplemental oxygen. Pt now speaking loudly on the phone about nursing staff and wearing a mask. Pt refusing vital signs and stating she will be filing a lawsuit.

## 2020-12-20 ENCOUNTER — Inpatient Hospital Stay (HOSPITAL_COMMUNITY)
Admission: EM | Admit: 2020-12-20 | Discharge: 2020-12-29 | DRG: 291 | Disposition: A | Discharge status: Home / Self Care | Payer: Medicaid - Out of State | Attending: Internal Medicine | Admitting: Internal Medicine

## 2020-12-20 ENCOUNTER — Other Ambulatory Visit: Payer: Self-pay

## 2020-12-20 ENCOUNTER — Encounter (HOSPITAL_COMMUNITY): Payer: Self-pay | Admitting: Emergency Medicine

## 2020-12-20 DIAGNOSIS — I252 Old myocardial infarction: Secondary | ICD-10-CM

## 2020-12-20 DIAGNOSIS — Z888 Allergy status to other drugs, medicaments and biological substances status: Secondary | ICD-10-CM

## 2020-12-20 DIAGNOSIS — Z66 Do not resuscitate: Secondary | ICD-10-CM | POA: Diagnosis not present

## 2020-12-20 DIAGNOSIS — E8770 Fluid overload, unspecified: Secondary | ICD-10-CM

## 2020-12-20 DIAGNOSIS — R52 Pain, unspecified: Secondary | ICD-10-CM

## 2020-12-20 DIAGNOSIS — R079 Chest pain, unspecified: Secondary | ICD-10-CM | POA: Diagnosis present

## 2020-12-20 DIAGNOSIS — R1030 Lower abdominal pain, unspecified: Secondary | ICD-10-CM | POA: Diagnosis present

## 2020-12-20 DIAGNOSIS — Z8249 Family history of ischemic heart disease and other diseases of the circulatory system: Secondary | ICD-10-CM

## 2020-12-20 DIAGNOSIS — D61818 Other pancytopenia: Secondary | ICD-10-CM | POA: Diagnosis present

## 2020-12-20 DIAGNOSIS — R55 Syncope and collapse: Secondary | ICD-10-CM | POA: Diagnosis not present

## 2020-12-20 DIAGNOSIS — Z87892 Personal history of anaphylaxis: Secondary | ICD-10-CM

## 2020-12-20 DIAGNOSIS — Z5900 Homelessness unspecified: Secondary | ICD-10-CM

## 2020-12-20 DIAGNOSIS — Z79899 Other long term (current) drug therapy: Secondary | ICD-10-CM

## 2020-12-20 DIAGNOSIS — F319 Bipolar disorder, unspecified: Secondary | ICD-10-CM | POA: Diagnosis present

## 2020-12-20 DIAGNOSIS — B20 Human immunodeficiency virus [HIV] disease: Secondary | ICD-10-CM | POA: Diagnosis present

## 2020-12-20 DIAGNOSIS — Z20822 Contact with and (suspected) exposure to covid-19: Secondary | ICD-10-CM | POA: Diagnosis present

## 2020-12-20 DIAGNOSIS — Z9151 Personal history of suicidal behavior: Secondary | ICD-10-CM

## 2020-12-20 DIAGNOSIS — Z21 Asymptomatic human immunodeficiency virus [HIV] infection status: Secondary | ICD-10-CM | POA: Diagnosis present

## 2020-12-20 DIAGNOSIS — F1910 Other psychoactive substance abuse, uncomplicated: Secondary | ICD-10-CM | POA: Diagnosis present

## 2020-12-20 DIAGNOSIS — I5043 Acute on chronic combined systolic (congestive) and diastolic (congestive) heart failure: Secondary | ICD-10-CM | POA: Diagnosis present

## 2020-12-20 DIAGNOSIS — E871 Hypo-osmolality and hyponatremia: Secondary | ICD-10-CM | POA: Diagnosis not present

## 2020-12-20 DIAGNOSIS — R0602 Shortness of breath: Secondary | ICD-10-CM | POA: Diagnosis present

## 2020-12-20 DIAGNOSIS — Z9114 Patient's other noncompliance with medication regimen: Secondary | ICD-10-CM

## 2020-12-20 DIAGNOSIS — Z2989 Encounter for other specified prophylactic measures: Secondary | ICD-10-CM

## 2020-12-20 DIAGNOSIS — I10 Essential (primary) hypertension: Secondary | ICD-10-CM | POA: Diagnosis present

## 2020-12-20 DIAGNOSIS — E876 Hypokalemia: Secondary | ICD-10-CM | POA: Diagnosis not present

## 2020-12-20 DIAGNOSIS — Z298 Encounter for other specified prophylactic measures: Secondary | ICD-10-CM

## 2020-12-20 DIAGNOSIS — F17210 Nicotine dependence, cigarettes, uncomplicated: Secondary | ICD-10-CM | POA: Diagnosis present

## 2020-12-20 DIAGNOSIS — I509 Heart failure, unspecified: Secondary | ICD-10-CM

## 2020-12-20 DIAGNOSIS — I110 Hypertensive heart disease with heart failure: Principal | ICD-10-CM | POA: Diagnosis present

## 2020-12-20 DIAGNOSIS — I169 Hypertensive crisis, unspecified: Secondary | ICD-10-CM | POA: Diagnosis present

## 2020-12-20 DIAGNOSIS — I959 Hypotension, unspecified: Secondary | ICD-10-CM | POA: Diagnosis not present

## 2020-12-20 DIAGNOSIS — B370 Candidal stomatitis: Secondary | ICD-10-CM

## 2020-12-20 DIAGNOSIS — I2510 Atherosclerotic heart disease of native coronary artery without angina pectoris: Secondary | ICD-10-CM | POA: Diagnosis present

## 2020-12-20 DIAGNOSIS — F17200 Nicotine dependence, unspecified, uncomplicated: Secondary | ICD-10-CM | POA: Diagnosis present

## 2020-12-20 NOTE — ED Triage Notes (Addendum)
Patient arrived with EMS from street ( homeless) reports central chest pain with SOB for 2 weeks , worse with deep inspiration /movement . No fever or chills . No emesis or diaphoresis .

## 2020-12-21 ENCOUNTER — Emergency Department (HOSPITAL_COMMUNITY): Payer: Medicaid - Out of State

## 2020-12-21 ENCOUNTER — Encounter (HOSPITAL_COMMUNITY): Payer: Self-pay | Admitting: Radiology

## 2020-12-21 DIAGNOSIS — Z298 Encounter for other specified prophylactic measures: Secondary | ICD-10-CM | POA: Diagnosis not present

## 2020-12-21 DIAGNOSIS — D61818 Other pancytopenia: Secondary | ICD-10-CM

## 2020-12-21 DIAGNOSIS — F1910 Other psychoactive substance abuse, uncomplicated: Secondary | ICD-10-CM | POA: Diagnosis present

## 2020-12-21 DIAGNOSIS — Z9114 Patient's other noncompliance with medication regimen: Secondary | ICD-10-CM | POA: Diagnosis not present

## 2020-12-21 DIAGNOSIS — B20 Human immunodeficiency virus [HIV] disease: Secondary | ICD-10-CM | POA: Diagnosis present

## 2020-12-21 DIAGNOSIS — Z8249 Family history of ischemic heart disease and other diseases of the circulatory system: Secondary | ICD-10-CM | POA: Diagnosis not present

## 2020-12-21 DIAGNOSIS — R1030 Lower abdominal pain, unspecified: Secondary | ICD-10-CM | POA: Diagnosis present

## 2020-12-21 DIAGNOSIS — I959 Hypotension, unspecified: Secondary | ICD-10-CM | POA: Diagnosis not present

## 2020-12-21 DIAGNOSIS — I5021 Acute systolic (congestive) heart failure: Secondary | ICD-10-CM | POA: Diagnosis not present

## 2020-12-21 DIAGNOSIS — F17210 Nicotine dependence, cigarettes, uncomplicated: Secondary | ICD-10-CM | POA: Diagnosis present

## 2020-12-21 DIAGNOSIS — Z888 Allergy status to other drugs, medicaments and biological substances status: Secondary | ICD-10-CM | POA: Diagnosis not present

## 2020-12-21 DIAGNOSIS — F319 Bipolar disorder, unspecified: Secondary | ICD-10-CM | POA: Diagnosis present

## 2020-12-21 DIAGNOSIS — Z79899 Other long term (current) drug therapy: Secondary | ICD-10-CM | POA: Diagnosis not present

## 2020-12-21 DIAGNOSIS — F3111 Bipolar disorder, current episode manic without psychotic features, mild: Secondary | ICD-10-CM | POA: Diagnosis not present

## 2020-12-21 DIAGNOSIS — R079 Chest pain, unspecified: Secondary | ICD-10-CM | POA: Diagnosis not present

## 2020-12-21 DIAGNOSIS — I5043 Acute on chronic combined systolic (congestive) and diastolic (congestive) heart failure: Secondary | ICD-10-CM | POA: Diagnosis present

## 2020-12-21 DIAGNOSIS — B370 Candidal stomatitis: Secondary | ICD-10-CM | POA: Diagnosis present

## 2020-12-21 DIAGNOSIS — Z9151 Personal history of suicidal behavior: Secondary | ICD-10-CM | POA: Diagnosis not present

## 2020-12-21 DIAGNOSIS — I509 Heart failure, unspecified: Secondary | ICD-10-CM | POA: Diagnosis not present

## 2020-12-21 DIAGNOSIS — I252 Old myocardial infarction: Secondary | ICD-10-CM | POA: Diagnosis not present

## 2020-12-21 DIAGNOSIS — I10 Essential (primary) hypertension: Secondary | ICD-10-CM | POA: Diagnosis not present

## 2020-12-21 DIAGNOSIS — F17200 Nicotine dependence, unspecified, uncomplicated: Secondary | ICD-10-CM

## 2020-12-21 DIAGNOSIS — I110 Hypertensive heart disease with heart failure: Secondary | ICD-10-CM | POA: Diagnosis not present

## 2020-12-21 DIAGNOSIS — I2510 Atherosclerotic heart disease of native coronary artery without angina pectoris: Secondary | ICD-10-CM | POA: Diagnosis present

## 2020-12-21 DIAGNOSIS — Z66 Do not resuscitate: Secondary | ICD-10-CM | POA: Diagnosis not present

## 2020-12-21 DIAGNOSIS — R0602 Shortness of breath: Secondary | ICD-10-CM | POA: Diagnosis present

## 2020-12-21 DIAGNOSIS — Z5900 Homelessness unspecified: Secondary | ICD-10-CM | POA: Diagnosis not present

## 2020-12-21 DIAGNOSIS — E871 Hypo-osmolality and hyponatremia: Secondary | ICD-10-CM | POA: Diagnosis not present

## 2020-12-21 DIAGNOSIS — I5023 Acute on chronic systolic (congestive) heart failure: Secondary | ICD-10-CM | POA: Diagnosis not present

## 2020-12-21 DIAGNOSIS — Z87892 Personal history of anaphylaxis: Secondary | ICD-10-CM | POA: Diagnosis not present

## 2020-12-21 DIAGNOSIS — E876 Hypokalemia: Secondary | ICD-10-CM | POA: Diagnosis not present

## 2020-12-21 DIAGNOSIS — Z20822 Contact with and (suspected) exposure to covid-19: Secondary | ICD-10-CM | POA: Diagnosis present

## 2020-12-21 DIAGNOSIS — R55 Syncope and collapse: Secondary | ICD-10-CM | POA: Diagnosis not present

## 2020-12-21 LAB — I-STAT BETA HCG BLOOD, ED (MC, WL, AP ONLY): I-stat hCG, quantitative: <5 m[IU]/mL (ref <5)

## 2020-12-21 LAB — CBC WITH DIFFERENTIAL/PLATELET
Abs Immature Granulocytes: 0.01 10*3/uL (ref 0.00–0.07)
Basophils Absolute: 0 10*3/uL (ref 0.0–0.1)
Basophils Relative: 1 %
Eosinophils Absolute: 0 10*3/uL (ref 0.0–0.5)
Eosinophils Relative: 1 %
HCT: 31.9 % — ABNORMAL LOW (ref 36.0–46.0)
Hemoglobin: 9.5 g/dL — ABNORMAL LOW (ref 12.0–15.0)
Immature Granulocytes: 1 %
Lymphocytes Relative: 21 %
Lymphs Abs: 0.3 10*3/uL — ABNORMAL LOW (ref 0.7–4.0)
MCH: 27.2 pg (ref 26.0–34.0)
MCHC: 29.8 g/dL — ABNORMAL LOW (ref 30.0–36.0)
MCV: 91.4 fL (ref 80.0–100.0)
Monocytes Absolute: 0.2 10*3/uL (ref 0.1–1.0)
Monocytes Relative: 11 %
Neutro Abs: 1 10*3/uL — ABNORMAL LOW (ref 1.7–7.7)
Neutrophils Relative %: 65 %
Platelets: 109 10*3/uL — ABNORMAL LOW (ref 150–400)
RBC: 3.49 MIL/uL — ABNORMAL LOW (ref 3.87–5.11)
RDW: 16.2 % — ABNORMAL HIGH (ref 11.5–15.5)
WBC: 1.5 10*3/uL — ABNORMAL LOW (ref 4.0–10.5)
nRBC: 0 % (ref 0.0–0.2)

## 2020-12-21 LAB — BASIC METABOLIC PANEL
Anion gap: 10 (ref 5–15)
BUN: 18 mg/dL (ref 6–20)
CO2: 23 mmol/L (ref 22–32)
Calcium: 9.1 mg/dL (ref 8.9–10.3)
Chloride: 102 mmol/L (ref 98–111)
Creatinine, Ser: 1.01 mg/dL — ABNORMAL HIGH (ref 0.44–1.00)
GFR, Estimated: >60 mL/min (ref >60)
Glucose, Bld: 102 mg/dL — ABNORMAL HIGH (ref 70–99)
Potassium: 3.9 mmol/L (ref 3.5–5.1)
Sodium: 135 mmol/L (ref 135–145)

## 2020-12-21 LAB — CBC
HCT: 29.9 % — ABNORMAL LOW (ref 36.0–46.0)
Hemoglobin: 9 g/dL — ABNORMAL LOW (ref 12.0–15.0)
MCH: 27.5 pg (ref 26.0–34.0)
MCHC: 30.1 g/dL (ref 30.0–36.0)
MCV: 91.4 fL (ref 80.0–100.0)
Platelets: 110 10*3/uL — ABNORMAL LOW (ref 150–400)
RBC: 3.27 MIL/uL — ABNORMAL LOW (ref 3.87–5.11)
RDW: 15.9 % — ABNORMAL HIGH (ref 11.5–15.5)
WBC: 1.2 10*3/uL — CL (ref 4.0–10.5)
nRBC: 0 % (ref 0.0–0.2)

## 2020-12-21 LAB — HEPATIC FUNCTION PANEL
ALT: 14 U/L (ref 0–44)
AST: 30 U/L (ref 15–41)
Albumin: 3.6 g/dL (ref 3.5–5.0)
Alkaline Phosphatase: 42 U/L (ref 38–126)
Bilirubin, Direct: 0.2 mg/dL (ref 0.0–0.2)
Indirect Bilirubin: 0.8 mg/dL (ref 0.3–0.9)
Total Bilirubin: 1 mg/dL (ref 0.3–1.2)
Total Protein: 7.4 g/dL (ref 6.5–8.1)

## 2020-12-21 LAB — RAPID URINE DRUG SCREEN, HOSP PERFORMED
Amphetamines: NOT DETECTED
Barbiturates: NOT DETECTED
Benzodiazepines: NOT DETECTED
Cocaine: NOT DETECTED
Opiates: NOT DETECTED
Tetrahydrocannabinol: POSITIVE — AB

## 2020-12-21 LAB — RESP PANEL BY RT-PCR (FLU A&B, COVID) ARPGX2
Influenza A by PCR: NEGATIVE
Influenza B by PCR: NEGATIVE
SARS Coronavirus 2 by RT PCR: NEGATIVE

## 2020-12-21 LAB — TROPONIN I (HIGH SENSITIVITY)
Troponin I (High Sensitivity): 41 ng/L — ABNORMAL HIGH (ref <18)
Troponin I (High Sensitivity): 42 ng/L — ABNORMAL HIGH (ref <18)
Troponin I (High Sensitivity): 45 ng/L — ABNORMAL HIGH (ref <18)

## 2020-12-21 LAB — T-HELPER CELLS (CD4) COUNT (NOT AT ARMC)
CD4 % Helper T Cell: 7 % — ABNORMAL LOW (ref 33–65)
CD4 T Cell Abs: <35 /uL — ABNORMAL LOW (ref 400–1790)

## 2020-12-21 LAB — BRAIN NATRIURETIC PEPTIDE: B Natriuretic Peptide: 1223.5 pg/mL — ABNORMAL HIGH (ref 0.0–100.0)

## 2020-12-21 MED ORDER — FUROSEMIDE 10 MG/ML IJ SOLN
40.0000 mg | Freq: Two times a day (BID) | INTRAMUSCULAR | Status: DC
  Administered 2020-12-21 – 2020-12-24 (×6): 40 mg via INTRAVENOUS
  Filled 2020-12-21 (×5): qty 4

## 2020-12-21 MED ORDER — SODIUM CHLORIDE 0.9 % IV SOLN: 250.0000 mL | INTRAVENOUS | Status: DC | PRN

## 2020-12-21 MED ORDER — FLUCONAZOLE 100 MG PO TABS
200.0000 mg | ORAL_TABLET | Freq: Every day | ORAL | Status: DC
  Administered 2020-12-21 – 2020-12-29 (×9): 200 mg via ORAL
  Filled 2020-12-21: qty 2
  Filled 2020-12-21: qty 1
  Filled 2020-12-21 (×7): qty 2

## 2020-12-21 MED ORDER — IOHEXOL 350 MG/ML SOLN
75.0000 mL | Freq: Once | INTRAVENOUS | Status: AC | PRN
  Administered 2020-12-21: 75 mL via INTRAVENOUS

## 2020-12-21 MED ORDER — FUROSEMIDE 10 MG/ML IJ SOLN
60.0000 mg | Freq: Once | INTRAMUSCULAR | Status: AC
  Administered 2020-12-21: 60 mg via INTRAVENOUS
  Filled 2020-12-21: qty 6

## 2020-12-21 MED ORDER — CARVEDILOL 6.25 MG PO TABS
6.2500 mg | ORAL_TABLET | Freq: Two times a day (BID) | ORAL | Status: DC
  Administered 2020-12-21 – 2020-12-29 (×14): 6.25 mg via ORAL
  Filled 2020-12-21 (×15): qty 1

## 2020-12-21 MED ORDER — LOSARTAN POTASSIUM 25 MG PO TABS
25.0000 mg | ORAL_TABLET | Freq: Every day | ORAL | Status: DC
  Administered 2020-12-21 – 2020-12-23 (×3): 25 mg via ORAL
  Filled 2020-12-21 (×3): qty 1

## 2020-12-21 MED ORDER — SODIUM CHLORIDE 0.9% FLUSH: 3.0000 mL | INTRAVENOUS | Status: DC | PRN

## 2020-12-21 MED ORDER — ALBUTEROL SULFATE HFA 108 (90 BASE) MCG/ACT IN AERS
2.0000 | INHALATION_SPRAY | Freq: Once | RESPIRATORY_TRACT | Status: AC
  Administered 2020-12-21: 2 via RESPIRATORY_TRACT
  Filled 2020-12-21: qty 6.7

## 2020-12-21 MED ORDER — NICOTINE 14 MG/24HR TD PT24
14.0000 mg | MEDICATED_PATCH | Freq: Every day | TRANSDERMAL | Status: DC
  Administered 2020-12-21 – 2020-12-26 (×6): 14 mg via TRANSDERMAL
  Filled 2020-12-21 (×9): qty 1

## 2020-12-21 MED ORDER — SODIUM CHLORIDE 0.9% FLUSH
3.0000 mL | Freq: Two times a day (BID) | INTRAVENOUS | Status: DC
  Administered 2020-12-21 – 2020-12-28 (×13): 3 mL via INTRAVENOUS

## 2020-12-21 MED ORDER — ASPIRIN EC 81 MG PO TBEC
81.0000 mg | DELAYED_RELEASE_TABLET | Freq: Every day | ORAL | Status: DC
  Administered 2020-12-22 – 2020-12-29 (×8): 81 mg via ORAL
  Filled 2020-12-21 (×8): qty 1

## 2020-12-21 NOTE — ED Notes (Signed)
Pt refused vitals and said she was leaving.

## 2020-12-21 NOTE — H&P (Signed)
History and Physical    Zikeria Keough KAJ:[id number] DOB: 1976-11-17 DOA: 12/20/2020  PCP: Marliss Coots, NP Consultants:  None Patient coming from:  Homeless; NOK: Emergency contact, Sister, [phone]  Chief Complaint: Chest pain  HPI: Jane Estrada is a 45 y.o. female with medical history significant of CAD; HIV; HTN; bipolar; granular cell tumor of the trachea; polysubstance abuse; homelessness; and chronic systolic CHF presenting with chest pain.  She reports that she has "been going through it."  Not able to breathe, CHF acting up, fluid on her lungs.  She had an esophageal infection in September and it recurred and that was the beginning of it all.  She came to the hospital with similar symptoms - and she wasn't treated.  2 months later on 12/7 she came back and told her she an esophageal infection.  They treated it with Bactrim, Protonix, Diflucan and Nystatin.  It helped to get rid of the infection but occasionally she can tell that the infection did damage to her esophagus.  She notices some dysphagia, difficulty belching.  She noticed SOB about 3 weeks ago - awoke about 0200 and felt like someone was standing on her chest.  She came here and was given Lasix and discharged.  It has gotten worse and worse.  Last night, she went to sleep early and woke up with SOB about an hour later.  It got worse while sitting there.  She still feels mildly SOB now, after 60 mg IV Lasix.  She is not taking any medication for 6 years for her BP and longer for the others.  She went to prison and 23 days later she had an allergic reaction and heart attack.  EMS intubated and caused esophageal trauma.  That happened in 2006.    ED Course:  CHF and HIV, CD4 40, not on meds in months.  Recurrent visits for CP/SOB.  Discharged with oral Lasix but worsening anyway.  Tachypnea and orthopnea.  CTA negative for PE, +vascular congestion.  Elevated BNP.  No fever.  At risk for opportunist infection.  Review of  Systems: As per HPI; otherwise review of systems reviewed and negative.   Ambulatory Status:  Ambulates without assistance  COVID Vaccine Status:  None  Past Medical History:  Diagnosis Date  . Bipolar affective ([place])   . Chronic systolic CHF (congestive heart failure) ([place])   . Granular cell tumor   . HIV (human immunodeficiency virus infection) ([place])   . Hypertension   . Myocardial infarct ([place]) 2006  . Tobacco dependence     Past Surgical History:  Procedure Laterality Date  . none      Social History   Socioeconomic History  . Marital status: Single    Spouse name: Not on file  . Number of children: Not on file  . Years of education: Not on file  . Highest education level: Not on file  Occupational History  . Occupation: unemployed  Tobacco Use  . Smoking status: Current Every Day Smoker    Packs/day: 0.50    Types: Cigarettes  . Smokeless tobacco: Never Used  Vaping Use  . Vaping Use: Never used  Substance and Sexual Activity  . Alcohol use: Not Currently  . Drug use: Yes    Frequency: 1.0 times per week    Types: Marijuana, Amphetamines  . Sexual activity: Not Currently  Other Topics Concern  . Not on file  Social History Narrative  . Not on file   Social Determinants of  Health   Financial Resource Strain: Not on file  Food Insecurity: Not on file  Transportation Needs: Not on file  Physical Activity: Not on file  Stress: Not on file  Social Connections: Not on file  Intimate Partner Violence: Not on file    Allergies  Allergen Reactions  . Lisinopril Anaphylaxis  . Remeron [Mirtazapine] Shortness Of Breath  . Abilify [Aripiprazole] Rash    Family History  Problem Relation Age of Onset  . Hypertension Mother   . Cancer Father     Prior to Admission medications   Medication Sig Start Date End Date Taking? Authorizing Provider  amLODipine (NORVASC) 10 MG tablet Take 1 tablet (10 mg total) by mouth daily. Patient not taking: No sig  reported 11/30/20 12/30/20  Linwood Dibbles, MD  cefpodoxime (VANTIN) 200 MG tablet Take 1 tablet (200 mg total) by mouth 2 (two) times daily. Patient not taking: No sig reported 06/19/20   Petrucelli, Samantha R, PA-C  furosemide (LASIX) 20 MG tablet Take 1 tablet (20 mg total) by mouth daily for 5 days. Patient not taking: Reported on 12/21/2020 12/06/20 12/11/20  Horton, Clabe Seal, DO  hydrALAZINE (APRESOLINE) 25 MG tablet Take 1 tablet (25 mg total) by mouth 3 (three) times daily. Patient not taking: No sig reported 11/30/20 12/30/20  Linwood Dibbles, MD  methocarbamol (ROBAXIN) 500 MG tablet Take 1 tablet (500 mg total) by mouth every 8 (eight) hours as needed for muscle spasms. Patient not taking: No sig reported 06/19/20   Petrucelli, Samantha R, PA-C  metoprolol tartrate (LOPRESSOR) 25 MG tablet Take 0.5 tablets (12.5 mg total) by mouth 2 (two) times daily. Patient not taking: No sig reported 11/30/20   Linwood Dibbles, MD  ondansetron (ZOFRAN ODT) 4 MG disintegrating tablet Take 1 tablet (4 mg total) by mouth every 8 (eight) hours as needed for nausea or vomiting. Patient not taking: Reported on 12/21/2020 05/24/20   Elpidio Anis, PA-C  pantoprazole (PROTONIX) 20 MG tablet Take 1 tablet (20 mg total) by mouth daily. Patient not taking: No sig reported 11/16/20   Eber Hong, MD  phenazopyridine (PYRIDIUM) 200 MG tablet Take 1 tablet (200 mg total) by mouth 3 (three) times daily as needed for pain. Patient not taking: No sig reported 06/19/20   Cherly Anderson, [place]    Physical Exam: Vitals:   12/21/20 0752 12/21/20 0911 12/21/20 1038 12/21/20 1134  BP: (!) 177/138 (!) 171/145 (!) 166/135 (!) 179/129  Pulse: (!) 101 (!) 109 87 (!) 102  Resp: 20 (!) 21 17 17   Temp: 97.8 F (36.6 C) 98 F (36.7 C)    TempSrc: Oral     SpO2: 100% 100% 100% 99%  Weight:      Height:         . General:  Appears calm and comfortable and is in NAD; refuses a mask but is wearing a hood that covers her mouth and  partially her nose . Eyes:   EOMI, normal lids, iris . ENT:  grossly normal hearing, lips & tongue, mmm; poor dentition . Neck:  no LAD, masses or thyromegaly . Cardiovascular:  RR with mild tachycardia, +apparent click vs. murmur. No LE edema.  Respiratory:  Scant bibasilar crackles.  Normal respiratory effort. . Abdomen:  soft, NT, ND, NABS . Skin:  no rash or induration seen on limited exam . Musculoskeletal:  grossly normal tone BUE/BLE, good ROM, no bony abnormality . Lower extremity:  No LE edema.  Limited foot exam with no  ulcerations.  2+ distal pulses. Marland Kitchen Psychiatric:  grossly normal mood and affect, speech fluent and appropriate, AOx3 . Neurologic:  CN 2-12 grossly intact, moves all extremities in coordinated fashion    Radiological Exams on Admission: Independently reviewed - see discussion in A/P where applicable  DG Chest 2 View  Result Date: 12/21/2020 CLINICAL DATA:  Shortness of breath and chest pain EXAM: CHEST - 2 VIEW COMPARISON:  12/06/2020 FINDINGS: Moderate cardiomegaly. No focal airspace consolidation. Mild linear opacity at the right lung base. No pleural effusion or pneumothorax. IMPRESSION: Cardiomegaly without pulmonary edema. Faint opacity at the right lung base, likely atelectasis. Electronically Signed   By: Deatra Robinson M.D.   On: 12/21/2020 01:14   CT Angio Chest PE W and/or Wo Contrast  Result Date: 12/21/2020 CLINICAL DATA:  Shortness of breath for 3 weeks. EXAM: CT ANGIOGRAPHY CHEST WITH CONTRAST TECHNIQUE: Multidetector CT imaging of the chest was performed using the standard protocol during bolus administration of intravenous contrast. Multiplanar CT image reconstructions and MIPs were obtained to evaluate the vascular anatomy. CONTRAST:  75 mL OMNIPAQUE IOHEXOL 350 MG/ML SOLN COMPARISON:  PA and lateral chest today. Single-view of the chest 11/30/2020. FINDINGS: Cardiovascular: The study is technically good. No pulmonary embolus is identified. The  patient has marked cardiomegaly. No pericardial effusion. No aneurysm. No atherosclerotic calcifications. Mediastinum/Nodes: No enlarged mediastinal, hilar, or axillary lymph nodes. Thyroid gland, trachea, and esophagus demonstrate no significant findings. Lungs/Pleura: No pleural effusion. Ground-glass attenuation is seen in the lower lobes bilaterally and appears symmetric. No nodule or mass. Upper Abdomen: Negative. Musculoskeletal: Negative. Review of the MIP images confirms the above findings. IMPRESSION: Negative for pulmonary embolus. Bilateral lower lobe ground-glass attenuation is nonspecific and could be due to atelectasis, vascular congestion or pneumonitis. Marked cardiomegaly. Electronically Signed   By: Drusilla Kanner M.D.   On: 12/21/2020 11:16    EKG: Independently reviewed.  NSR with rate 102; prolonged QTc 552; LPFB; nonspecific ST changes with no evidence of acute ischemia   Labs on Admission: I have personally reviewed the available labs and imaging studies at the time of the admission.  Pertinent labs:   HS troponin 41, 42, 45 BNP 1223.5 WBC 1.5 Hgb 9.5 Platelets 109 HCG <5 COVID/flu negative   Assessment/Plan Active Problems:   * No active hospital problems. *   Acute on chronic combined CHF -Patient with known h/o chronic combined CHF (echo in 03/2020 with EF 45-50% and grade 1 diastolic dysfunction) presenting with worsening SOB without hypoxia -CXR consistent with mild pulmonary edema -Elevated BNP -With elevated BNP and abnl CXR, acute decompensated CHF seems probable as diagnosis (or contributing to diagnosis) -Will admit, as per the Emergency HF Mortality Risk Grade.  The patient has: other severe comorbid medical issues. -Will request echocardiogram -Will start ASA -ACE/ARB, beta blocker, and spironolactone are recommended as per guideline-directed medical therapy to reduce morbidity/mortality -In order to facilitate possible transition to [facility]  (during hospitalization or in the future), will start Cozaar -CHF order set utilized; may need CHF team consult but will hold until Echo results are available -Was given Lasix 60 mg x 1 in ER and will repeat with 40 mg IV BID -PRN [place] O2 for now -Normal kidney function at this time, will follow -Repeat EKG in AM -Mildly elevated HS troponin is likely related to demand ischemia; doubt ACS based on symptoms  Untreated HIV -CD4 <35, viral load pending -Can get meds through [facility] - but she has to f/u  for this to happen -She voices interest in getting her GED and housing and they can also help that to happen -ID consult requested -Needs PJP prophylaxis and to slowly build her immune system back up  Oral thrush -Diflucan 200 mg oral daily -She is likely a poor current candidate for EGD given untreated AIDS -It seems probable that she has disseminated thrush  HTN -Patient acknowledges not taking medication for this issue and is resistant to all medications at this time  -She was previously prescribed Norvasc, Hydralazine, Lopressor -Start Cozaar and Coreg (low-dose) for now -Will add prn hydralazine  Homelessness/Social issues -patient moved here about 1 year ago after her husband overdosed and died in [place] -She reports difficulty in finding housing -She reports interest in completing high school -She would have significant resources available to her through the [facility] and should be encouraged to seek those out -Has primary psychiatric disease vs. Substance-induced mental health disease; psych was consulted in 03/2020 and offered outpatient resources.   -She does not appear to have active SI/HI and inpatient psych consult is likely to provide outpatient referrals again  Polysubstance abuse -UDS + for marijuana and amphetamines -Also with h/o cocaine use in the past -TOC team consult requested  Tobacco dependence -Encourage cessation.   -Patch ordered     Note:  This patient has been tested and is negative for the novel coronavirus COVID-19. She has NOT been vaccinated against COVID-19.   DVT prophylaxis: Lovenox  Code Status:  DNR - confirmed with patient Family Communication: None present Disposition Plan:  The patient is from: homeless  Anticipated d/c is to: homeless  Anticipated d/c date will depend on clinical response to treatment, likely 2-4 days  Patient is currently: acutely ill Consults called: ID; TOC team Admission status: Admit - It is my clinical opinion that admission to INPATIENT is reasonable and necessary because this patient will require at least 2 midnights in the hospital to treat this condition based on the medical complexity of the problems presented.  Given the aforementioned information, the predictability of an adverse outcome is felt to be significant.    Karmen Bongo MD [facility]   How to contact the [facility] Attending or Consulting provider [place] or covering provider during after hours [place], for this patient?  1. Check the care team in [facility] and look for a) attending/consulting TR[facility] provider listed and b) the [facility] team listed 2. Log into [email or web address] and use [place]'s universal password to access. If you do not have the password, please contact the hospital operator. 3. Locate the [facility] provider you are looking for under [facility] and page to a number that you can be directly reached. 4. If you still have difficulty reaching the provider, please page the [facility] (Director on Call) for the Hospitalists listed on amion for assistance.   12/21/2020, 12:00 PM

## 2020-12-21 NOTE — Consult Note (Signed)
[facility] for Infectious Disease    Date of Admission:  12/20/2020     Reason for Consult: HIV management     Referring Physician: Dr. Lorin Mercy  Principal Problem:   HIV (human immunodeficiency virus infection) ([place]) Active Problems:   Chest pain   Tobacco dependence   Hypertension   Bipolar affective ([place])   Polysubstance abuse ([place])   Pancytopenia ([place])   Shortness of breath   Thrush   Need for pneumocystis prophylaxis   ASSESSMENT:    HIV/AIDS- patient has significant obstacles making her prior, as well as future, adherence with ART and outpatient follow-up problematic. This includes bipolar disorder, substance use, and a belief that prescription medications for her HIV will make her worse. She thinks that she has been managing well without HIV medication and as a result does not need to be on treatment. I attempted to discuss that her immune system is depleted and that she is at risk for developing further complications related to her infection Thrush Heart failure exacerbation Bipolar disorder, substance use disorder  PLAN:    Await results of HIV viral load I asked her to consider whether or not she would be willing to try a newer combination pill that causes no side effects in most people who take it. I will return to talk to her tomorrow more about it as well as talk to her about pneumocystis prophylaxis Suggest psychiatry evaluation If patient continues to decline ART then I would suggest palliative care consult Continue fluconazole for thrush  HPI:    Jane Estrada is a 45 y.o. female with past medical history of HIV/AIDS (not on antiretrovirals), bipolar disorder, hypertension, HFrEF, tobacco use disorder, and currently without housing who presents to the ED this morning for further evaluation of chest pain and shortness of breath. She was similarly evaluated for these complaints several times over the last month. She reports that her shortness of breath is  relatively constant over the several weeks. Today she also endorsed chest pain and so she presented to the emergency room. EKG obtained was without acute ischemic changes, her troponin was mildly elevated but similar to prior measurements. Given her symptoms a CTA chest was obtained. This was negative for PE, however, it demonstrated atelectasis versus vascular congestion versus pneumonitis. BNP also profoundly elevated. Given the overall clinical picture of a heart failure exacerbation she is being admitted for IV diuretics. We have been consulted for further management of her HIV medications.  On review of systems, she denies any fevers or chills. She does have a cough which produces some clear/white mucus. She has an occasional headache but nothing constant. She has no abdominal pain, nausea, vomiting, diarrhea. She has no urinary complaints.  A viral load was sent and currently pending. CD4 count is less than 35 (7%). LFTs are normal. CBC shows pancytopenia.  Regarding her HIV, she was diagnosed with HIV infection in 2005. She was followed at that time at [facility] in [place]. CD4 count at time of diagnosis was 165 and viral load 77,000. Records reviewed in care everywhere. This shows that she has had intermittent periods of viral suppression since diagnosis. She had evidence of immune reconstitution with improved CD4 counts to normal levels quickly after starting therapy. She has been on a variety of regimens but nothing since 2012 per her report. A genotype in 2014 showed that predominant virus had NNRTI resistance. Records show that viral control has been very poor since at least 2015. She  has a history of polysubstance use disorder and bipolar disorder. Additionally she has a history of suicide attempts. When last seen in [place] in March 2020, her CD4 count had fallen to a nadir of 146. Her HIV provider felt that sudden decline from 359 a few weeks earlier was due to acute leukopenia. As a  result, pneumocystis prophylaxis was not started. Her viral load at that time was 5751. He recommended she restart her Triumeq but she never picked up the medication. Per chart review, there does not appear to have been any evidence of prior opportunistic infection and she does not recall any. She was admitted to this hospital back in April 2021. At that time her viral load was 169,000, CD4 count 41. Genotype at that time showed no significant mutations and wild-type virus.  When seen this afternoon, patient reports that all prior ART regimens made her feel bad and they interact with her other medications to the point that she cannot tolerate them. She thinks she has been doing well off of ART and is not interested in resuming at the moment. She has been in [facility] since April 2021 when she moved here from [place]. She states that she does not know anybody here and is currently without housing. When I discussed newer treatment options for HIV that are universally tolerated by most patients she reinforced that she is not going to take any medications right now and is not interested in treatment.   Past Medical History:  Diagnosis Date   Bipolar affective ([place])    Chronic systolic CHF (congestive heart failure) ([facility])    Granular cell tumor    HIV (human immunodeficiency virus infection) ([place])    Hypertension    Myocardial infarct ([place]) 2006   Tobacco dependence     Social History   Tobacco Use   Smoking status: Current Every Day Smoker    Packs/day: 0.50    Years: 30.00    Pack years: 15.00    Types: Cigarettes   Smokeless tobacco: Never Used  Vaping Use   Vaping Use: Never used  Substance Use Topics   Alcohol use: Not Currently    Comment: last drink in 02/2020   Drug use: Yes    Frequency: 1.0 times per week    Types: Marijuana, Amphetamines, "Crack" cocaine    Comment: daily marijuana use; denies using other drugs but previously used crack    Family  History  Problem Relation Age of Onset   Hypertension Mother    Cancer Father     Allergies  Allergen Reactions   Lisinopril Anaphylaxis   Remeron [Mirtazapine] Shortness Of Breath   Abilify [Aripiprazole] Rash    Review of Systems  Constitutional: Negative for chills and fever.  HENT: Negative.   Eyes: Negative.   Respiratory: Positive for cough and shortness of breath.   Cardiovascular: Positive for chest pain and PND. Negative for leg swelling.  Gastrointestinal: Negative.   Genitourinary: Negative.   Musculoskeletal: Negative.   Skin: Negative.   Neurological: Negative.   Psychiatric/Behavioral: Positive for depression.    OBJECTIVE:   Blood pressure (!) 170/136, pulse 92, temperature (!) 97.5 F (36.4 C), resp. rate 16, height 5\' 4"  (1.626 m), weight 50 kg, last menstrual period 11/24/2020, SpO2 100 %. Body mass index is 18.92 kg/m.  Physical Exam Constitutional:      Comments: Thin, cachectic appearing woman lying in a hospital stretcher in the emergency department hallway.  HENT:     Head: Normocephalic  and atraumatic.     Mouth/Throat:     Comments: She has oropharyngeal thrush. Dentition is poor. Cardiovascular:     Rate and Rhythm: Normal rate and regular rhythm.  Pulmonary:     Effort: Pulmonary effort is normal. No respiratory distress.  Abdominal:     General: Abdomen is flat.     Palpations: Abdomen is soft.     Tenderness: There is no abdominal tenderness.  Musculoskeletal:        General: No swelling.     Right lower leg: No edema.     Left lower leg: No edema.  Skin:    General: Skin is warm and dry.     Findings: No rash.  Neurological:     General: No focal deficit present.     Mental Status: She is oriented to person, place, and time.  Psychiatric:        Mood and Affect: Mood normal.        Behavior: Behavior normal.     Comments: Her insight into her current medical problems is poor.       Lab Results: Lab Results   Component Value Date   WBC 1.5 (L) 12/21/2020   HGB 9.5 (L) 12/21/2020   HCT 31.9 (L) 12/21/2020   MCV 91.4 12/21/2020   PLT 109 (L) 12/21/2020    Lab Results  Component Value Date   NA 135 12/20/2020   K 3.9 12/20/2020   CO2 23 12/20/2020   GLUCOSE 102 (H) 12/20/2020   BUN 18 12/20/2020   CREATININE 1.01 (H) 12/20/2020   CALCIUM 9.1 12/20/2020   GFRNONAA >60 12/20/2020   GFRAA >60 08/12/2020    Lab Results  Component Value Date   ALT 14 12/21/2020   AST 30 12/21/2020   ALKPHOS 42 12/21/2020   BILITOT 1.0 12/21/2020    No results found for: CRP  No results found for: ESRSEDRATE  I have reviewed the micro and lab results in Epic.  Imaging: DG Chest 2 View  Result Date: 12/21/2020 CLINICAL DATA:  Shortness of breath and chest pain EXAM: CHEST - 2 VIEW COMPARISON:  12/06/2020 FINDINGS: Moderate cardiomegaly. No focal airspace consolidation. Mild linear opacity at the right lung base. No pleural effusion or pneumothorax. IMPRESSION: Cardiomegaly without pulmonary edema. Faint opacity at the right lung base, likely atelectasis. Electronically Signed   By: Ulyses Jarred M.D.   On: 12/21/2020 01:14   CT Angio Chest PE W and/or Wo Contrast  Result Date: 12/21/2020 CLINICAL DATA:  Shortness of breath for 3 weeks. EXAM: CT ANGIOGRAPHY CHEST WITH CONTRAST TECHNIQUE: Multidetector CT imaging of the chest was performed using the standard protocol during bolus administration of intravenous contrast. Multiplanar CT image reconstructions and MIPs were obtained to evaluate the vascular anatomy. CONTRAST:  75 mL OMNIPAQUE IOHEXOL 350 MG/ML SOLN COMPARISON:  PA and lateral chest today. Single-view of the chest 11/30/2020. FINDINGS: Cardiovascular: The study is technically good. No pulmonary embolus is identified. The patient has marked cardiomegaly. No pericardial effusion. No aneurysm. No atherosclerotic calcifications. Mediastinum/Nodes: No enlarged mediastinal, hilar, or axillary lymph  nodes. Thyroid gland, trachea, and esophagus demonstrate no significant findings. Lungs/Pleura: No pleural effusion. Ground-glass attenuation is seen in the lower lobes bilaterally and appears symmetric. No nodule or mass. Upper Abdomen: Negative. Musculoskeletal: Negative. Review of the MIP images confirms the above findings. IMPRESSION: Negative for pulmonary embolus. Bilateral lower lobe ground-glass attenuation is nonspecific and could be due to atelectasis, vascular congestion or pneumonitis. Marked cardiomegaly. Electronically Signed  By: Inge Rise M.D.   On: 12/21/2020 11:16     Imaging independently reviewed in Epic.  [facility]ious [facility] [phone] pager 12/21/2020, 5:13 PM  I spent greater than 110 minutes with the patient including greater than 50% of time in face to face counsel of the patient and in coordination of their care.

## 2020-12-21 NOTE — ED Notes (Signed)
Pt resting comfortably in bed, denies pain at this time States breathing feels much better Has made multiple independent trips to restroom to urinate.   Updated on plan of care

## 2020-12-21 NOTE — ED Provider Notes (Signed)
[facility] EMERGENCY DEPARTMENT Provider Note   CSN: [id number] Arrival date & time: 12/20/20  2314     History Chief Complaint  Patient presents with  . Chest Pain    Jane Estrada is a 45 y.o. female.  CAD; HTN;systolicCHF; bipolar d/o; tobacco dependence; polysubstance abuse; granular cell tumor of the trachea; HIV; and homelessness presents to ER with concern for chest pain, shortness of breath.  Patient reports that she has been feeling more short of breath over the past couple weeks has relatively constant chest pain that is worse with certain movements as well as deep breathing.  Pain is currently moderate, sharp.  Nonradiating.  States that breathing gets worse when she lies flat or exerts herself.  Has to use multiple book bags and blankets to prop up her head at night to sleep.  States that she has not been taking any Lasix recently.  Has not been taking HIV medications in a long time.  Smokes cigarettes regularly.  Reviewed chart, multiple recent ER visits, admission in April.  Difficulty with medication noncompliance, substance abuse, bipolar disorder.  Echocardiogram with EF 40 to 45%, Myoview negative for ischemia.  HPI     Past Medical History:  Diagnosis Date  . Bipolar affective ([place])   . Chronic systolic CHF (congestive heart failure) ([place])   . Granular cell tumor   . HIV (human immunodeficiency virus infection) ([place])   . Hypertension   . Myocardial infarct ([place]) 2006  . Tobacco dependence     Patient Active Problem List   Diagnosis Date Noted  . Shortness of breath 12/21/2020  . Chest pain 03/15/2020  . Polysubstance abuse ([place]) 03/15/2020  . Pancytopenia ([place]) 03/15/2020  . 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 Tobacco dependence   . Hypertension   . HIV (human immunodeficiency virus infection) ([place])   . Granular cell tumor   . Chronic systolic CHF (congestive heart failure) ([place])   . Bipolar affective ([place])    . History of myocardial infarction 2006    Past Surgical History:  Procedure Laterality Date  . none       OB History   No obstetric history on file.     Family History  Problem Relation Age of Onset  . Hypertension Mother   . Cancer Father     Social History   Tobacco Use  . Smoking status: Current Every Day Smoker    Packs/day: 0.50    Types: Cigarettes  . Smokeless tobacco: Never Used  Vaping Use  . Vaping Use: Never used  Substance Use Topics  . Alcohol use: Not Currently  . Drug use: Yes    Frequency: 1.0 times per week    Types: Marijuana, Amphetamines    Home Medications Prior to Admission medications   Medication Sig Start Date End Date Taking? Authorizing Provider  amLODipine (NORVASC) 10 MG tablet Take 1 tablet (10 mg total) by mouth daily. Patient not taking: No sig reported 11/30/20 12/30/20  Dorie Rank, MD  cefpodoxime (VANTIN) 200 MG tablet Take 1 tablet (200 mg total) by mouth 2 (two) times daily. Patient not taking: No sig reported 06/19/20   Petrucelli, Samantha R, PA-C  furosemide (LASIX) 20 MG tablet Take 1 tablet (20 mg total) by mouth daily for 5 days. Patient not taking: Reported on 12/21/2020 12/06/20 12/11/20  Horton, Alvin Critchley, DO  hydrALAZINE (APRESOLINE) 25 MG tablet Take 1 tablet (25 mg total) by mouth 3 (three) times daily. Patient not taking: No  sig reported 11/30/20 12/30/20  Dorie Rank, MD  methocarbamol (ROBAXIN) 500 MG tablet Take 1 tablet (500 mg total) by mouth every 8 (eight) hours as needed for muscle spasms. Patient not taking: No sig reported 06/19/20   Petrucelli, Samantha R, PA-C  metoprolol tartrate (LOPRESSOR) 25 MG tablet Take 0.5 tablets (12.5 mg total) by mouth 2 (two) times daily. Patient not taking: No sig reported 11/30/20   Dorie Rank, MD  ondansetron (ZOFRAN ODT) 4 MG disintegrating tablet Take 1 tablet (4 mg total) by mouth every 8 (eight) hours as needed for nausea or vomiting. Patient not taking: Reported on  12/21/2020 05/24/20   Charlann Lange, PA-C  pantoprazole (PROTONIX) 20 MG tablet Take 1 tablet (20 mg total) by mouth daily. Patient not taking: No sig reported 11/16/20   Noemi Chapel, MD  phenazopyridine (PYRIDIUM) 200 MG tablet Take 1 tablet (200 mg total) by mouth 3 (three) times daily as needed for pain. Patient not taking: No sig reported 06/19/20   Petrucelli, Samantha R, PA-C    Allergies    Lisinopril, Remeron [mirtazapine], and Abilify [aripiprazole]  Review of Systems   Review of Systems  Constitutional: Negative for chills and fever.  HENT: Negative for ear pain and sore throat.   Eyes: Negative for pain and visual disturbance.  Respiratory: Positive for chest tightness and shortness of breath. Negative for cough.   Cardiovascular: Positive for chest pain. Negative for palpitations.  Gastrointestinal: Negative for abdominal pain and vomiting.  Genitourinary: Negative for dysuria and hematuria.  Musculoskeletal: Negative for arthralgias and back pain.  Skin: Negative for color change and rash.  Neurological: Negative for seizures and syncope.  All other systems reviewed and are negative.   Physical Exam Updated Vital Signs BP (!) 167/136 (BP Location: Right Arm)   Pulse 94   Temp 98 F (36.7 C)   Resp 19   Ht 5\' 4"  (1.626 m)   Wt 65 kg   LMP 11/24/2020   SpO2 100%   BMI 24.60 kg/m   Physical Exam Vitals and nursing note reviewed.  Constitutional:      General: She is not in acute distress.    Appearance: She is well-developed and well-nourished.  HENT:     Head: Normocephalic and atraumatic.  Eyes:     Conjunctiva/sclera: Conjunctivae normal.  Cardiovascular:     Rate and Rhythm: Regular rhythm. Tachycardia present.     Heart sounds: No murmur heard.   Pulmonary:     Comments: Mild expiratory wheeze noted bilaterally, mild tachypnea but speaking in full sentences Abdominal:     Palpations: Abdomen is soft.     Tenderness: There is no abdominal  tenderness.  Musculoskeletal:        General: No edema.     Cervical back: Neck supple.  Skin:    General: Skin is warm and dry.  Neurological:     Mental Status: She is alert.  Psychiatric:        Mood and Affect: Mood and affect normal.     ED Results / Procedures / Treatments   Labs (all labs ordered are listed, but only abnormal results are displayed) Labs Reviewed  BASIC METABOLIC PANEL - Abnormal; Notable for the following components:      Result Value   Glucose, Bld 102 (*)    Creatinine, Ser 1.01 (*)    All other components within normal limits  CBC - Abnormal; Notable for the following components:   WBC 1.2 (*)  RBC 3.27 (*)    Hemoglobin 9.0 (*)    HCT 29.9 (*)    RDW 15.9 (*)    Platelets 110 (*)    All other components within normal limits  CBC WITH DIFFERENTIAL/PLATELET - Abnormal; Notable for the following components:   WBC 1.5 (*)    RBC 3.49 (*)    Hemoglobin 9.5 (*)    HCT 31.9 (*)    MCHC 29.8 (*)    RDW 16.2 (*)    Platelets 109 (*)    Neutro Abs 1.0 (*)    Lymphs Abs 0.3 (*)    All other components within normal limits  BRAIN NATRIURETIC PEPTIDE - Abnormal; Notable for the following components:   B Natriuretic Peptide 1,223.5 (*)    All other components within normal limits  TROPONIN I (HIGH SENSITIVITY) - Abnormal; Notable for the following components:   Troponin I (High Sensitivity) 41 (*)    All other components within normal limits  TROPONIN I (HIGH SENSITIVITY) - Abnormal; Notable for the following components:   Troponin I (High Sensitivity) 42 (*)    All other components within normal limits  TROPONIN I (HIGH SENSITIVITY) - Abnormal; Notable for the following components:   Troponin I (High Sensitivity) 45 (*)    All other components within normal limits  RESP PANEL BY RT-PCR (FLU A&B, COVID) ARPGX2  HEPATIC FUNCTION PANEL  HIV-1 RNA QUANT-NO REFLEX-BLD  T-HELPER CELLS (CD4) COUNT (NOT AT [facility])  I-STAT BETA HCG BLOOD, ED ([facility], [facility], [facility]  ONLY)    EKG EKG Interpretation  Date/Time:  Tuesday December 21 2020 00:07:59 EST Ventricular Rate:  102 PR Interval:  154 QRS Duration: 90 QT Interval:  424 QTC Calculation: 552 R Axis:   115 Text Interpretation: Critical Test Result: Long QTc Sinus tachycardia Possible Left atrial enlargement Left posterior fascicular block Biventricular hypertrophy T wave abnormality, consider lateral ischemia Prolonged QT Abnormal ECG when compared to ecg on jan 10, no significant change appreciated Confirmed by Madalyn Rob [phone]) on 12/21/2020 9:18:59 AM  Radiology DG Chest 2 View  Result Date: 12/21/2020 CLINICAL DATA:  Shortness of breath and chest pain EXAM: CHEST - 2 VIEW COMPARISON:  12/06/2020 FINDINGS: Moderate cardiomegaly. No focal airspace consolidation. Mild linear opacity at the right lung base. No pleural effusion or pneumothorax. IMPRESSION: Cardiomegaly without pulmonary edema. Faint opacity at the right lung base, likely atelectasis. Electronically Signed   By: Ulyses Jarred M.D.   On: 12/21/2020 01:14   CT Angio Chest PE W and/or Wo Contrast  Result Date: 12/21/2020 CLINICAL DATA:  Shortness of breath for 3 weeks. EXAM: CT ANGIOGRAPHY CHEST WITH CONTRAST TECHNIQUE: Multidetector CT imaging of the chest was performed using the standard protocol during bolus administration of intravenous contrast. Multiplanar CT image reconstructions and MIPs were obtained to evaluate the vascular anatomy. CONTRAST:  75 mL OMNIPAQUE IOHEXOL 350 MG/ML SOLN COMPARISON:  PA and lateral chest today. Single-view of the chest 11/30/2020. FINDINGS: Cardiovascular: The study is technically good. No pulmonary embolus is identified. The patient has marked cardiomegaly. No pericardial effusion. No aneurysm. No atherosclerotic calcifications. Mediastinum/Nodes: No enlarged mediastinal, hilar, or axillary lymph nodes. Thyroid gland, trachea, and esophagus demonstrate no significant findings. Lungs/Pleura: No pleural  effusion. Ground-glass attenuation is seen in the lower lobes bilaterally and appears symmetric. No nodule or mass. Upper Abdomen: Negative. Musculoskeletal: Negative. Review of the MIP images confirms the above findings. IMPRESSION: Negative for pulmonary embolus. Bilateral lower lobe ground-glass attenuation is nonspecific and could be due  to atelectasis, vascular congestion or pneumonitis. Marked cardiomegaly. Electronically Signed   By: Inge Rise M.D.   On: 12/21/2020 11:16    Procedures Procedures (including critical care time)  Medications Ordered in ED Medications  furosemide (LASIX) injection 60 mg (60 mg Intravenous Given 12/21/20 0944)  albuterol (VENTOLIN HFA) 108 (90 Base) MCG/ACT inhaler 2 puff (2 puffs Inhalation Given 12/21/20 0941)  iohexol (OMNIPAQUE) 350 MG/ML injection 75 mL (75 mLs Intravenous Contrast Given 12/21/20 1104)    ED Course  I have reviewed the triage vital signs and the nursing notes.  Pertinent labs & imaging results that were available during my care of the patient were reviewed by me and considered in my medical decision making (see chart for details).    MDM Rules/Calculators/A&P                         45 year old lady CAD; HTN;systolicCHF; bipolar d/o; tobacco dependence; polysubstance abuse; granular cell tumor of the trachea; HIV; presenting to the emergency room with concern for shortness of breath and chest pain.  On physical exam, patient noted to have some tachypnea, mild wheeze but not in frank distress.  EKG without acute ischemic change, her troponin appears to be mildly elevated but no significant delta troponin.  Lower suspicion for ACS.  Given chest pain, SOB and her mild troponin elevation, check CTA chest which was negative for PE.  However CT demonstrated atelectasis versus pneumonitis versus vascular congestion.  Her BNP is profoundly elevated.  Suspect patient has heart failure as underlying driver of her symptoms today.  Will start  on IV diuretic therapy.  Given overall clinical picture, believe patient would benefit from admission for IV diuretic therapy and further observation.  Will send for stat CD4 count and HIV quant analyses.  She is noted to have low WBCs and prior CD4 counts were very low.  Patient not on antiretrovirals at present.  No fever.  Discussed case with Dr. Lorin Mercy who will admit patient for further management.  Final Clinical Impression(s) / ED Diagnoses Final diagnoses:  Heart failure, unspecified HF chronicity, unspecified heart failure type (HCC)  Hypervolemia, unspecified hypervolemia type    Rx / DC Orders ED Discharge Orders    None       Lucrezia Starch, MD 12/21/20 1247

## 2020-12-21 NOTE — ED Notes (Addendum)
Pt was able to ambulate with Spo2 dropping to 94 and pulse rate 109

## 2020-12-22 ENCOUNTER — Inpatient Hospital Stay (HOSPITAL_COMMUNITY): Payer: Medicaid - Out of State

## 2020-12-22 DIAGNOSIS — F17200 Nicotine dependence, unspecified, uncomplicated: Secondary | ICD-10-CM | POA: Diagnosis not present

## 2020-12-22 DIAGNOSIS — D61818 Other pancytopenia: Secondary | ICD-10-CM | POA: Diagnosis not present

## 2020-12-22 DIAGNOSIS — B20 Human immunodeficiency virus [HIV] disease: Secondary | ICD-10-CM | POA: Diagnosis not present

## 2020-12-22 DIAGNOSIS — F1910 Other psychoactive substance abuse, uncomplicated: Secondary | ICD-10-CM | POA: Diagnosis not present

## 2020-12-22 DIAGNOSIS — F3111 Bipolar disorder, current episode manic without psychotic features, mild: Secondary | ICD-10-CM | POA: Diagnosis not present

## 2020-12-22 DIAGNOSIS — I5021 Acute systolic (congestive) heart failure: Secondary | ICD-10-CM | POA: Diagnosis not present

## 2020-12-22 DIAGNOSIS — I509 Heart failure, unspecified: Secondary | ICD-10-CM | POA: Diagnosis not present

## 2020-12-22 LAB — BASIC METABOLIC PANEL
Anion gap: 9 (ref 5–15)
BUN: 22 mg/dL — ABNORMAL HIGH (ref 6–20)
CO2: 26 mmol/L (ref 22–32)
Calcium: 8.8 mg/dL — ABNORMAL LOW (ref 8.9–10.3)
Chloride: 97 mmol/L — ABNORMAL LOW (ref 98–111)
Creatinine, Ser: 1.24 mg/dL — ABNORMAL HIGH (ref 0.44–1.00)
GFR, Estimated: 55 mL/min — ABNORMAL LOW (ref >60)
Glucose, Bld: 104 mg/dL — ABNORMAL HIGH (ref 70–99)
Potassium: 3.7 mmol/L (ref 3.5–5.1)
Sodium: 132 mmol/L — ABNORMAL LOW (ref 135–145)

## 2020-12-22 LAB — CBC WITH DIFFERENTIAL/PLATELET
Abs Immature Granulocytes: 0.01 10*3/uL (ref 0.00–0.07)
Basophils Absolute: 0 10*3/uL (ref 0.0–0.1)
Basophils Relative: 1 %
Eosinophils Absolute: 0 10*3/uL (ref 0.0–0.5)
Eosinophils Relative: 1 %
HCT: 29.6 % — ABNORMAL LOW (ref 36.0–46.0)
Hemoglobin: 9.6 g/dL — ABNORMAL LOW (ref 12.0–15.0)
Immature Granulocytes: 1 %
Lymphocytes Relative: 18 %
Lymphs Abs: 0.2 10*3/uL — ABNORMAL LOW (ref 0.7–4.0)
MCH: 28.4 pg (ref 26.0–34.0)
MCHC: 32.4 g/dL (ref 30.0–36.0)
MCV: 87.6 fL (ref 80.0–100.0)
Monocytes Absolute: 0.2 10*3/uL (ref 0.1–1.0)
Monocytes Relative: 13 %
Neutro Abs: 0.7 10*3/uL — ABNORMAL LOW (ref 1.7–7.7)
Neutrophils Relative %: 66 %
Platelets: 110 10*3/uL — ABNORMAL LOW (ref 150–400)
RBC: 3.38 MIL/uL — ABNORMAL LOW (ref 3.87–5.11)
RDW: 16.1 % — ABNORMAL HIGH (ref 11.5–15.5)
WBC: 1.1 10*3/uL — CL (ref 4.0–10.5)
nRBC: 0 % (ref 0.0–0.2)

## 2020-12-22 LAB — HIV-1 RNA QUANT-NO REFLEX-BLD
HIV 1 RNA Quant: 560000 copies/mL
LOG10 HIV-1 RNA: 5.748 log10copy/mL

## 2020-12-22 LAB — ECHOCARDIOGRAM COMPLETE
AR max vel: 3.08 cm2
AV Area VTI: 3.36 cm2
AV Area mean vel: 2.76 cm2
AV Mean grad: 1 mmHg
AV Peak grad: 1.8 mmHg
Ao pk vel: 0.68 m/s
Area-P 1/2: 2.87 cm2
Height: 64 in
S' Lateral: 5.2 cm
Weight: 1755.2 oz

## 2020-12-22 MED ORDER — ATOVAQUONE 750 MG/5ML PO SUSP
1500.0000 mg | Freq: Every day | ORAL | Status: DC
  Administered 2020-12-23 – 2020-12-26 (×4): 1500 mg via ORAL
  Filled 2020-12-22 (×8): qty 10

## 2020-12-22 NOTE — Progress Notes (Signed)
Progress Note    Jane Estrada  TGG:[id number] DOB: Apr 16, 1976  DOA: 12/20/2020 PCP: Lavinia Sharps, NP    Brief Narrative:     Medical records reviewed and are as summarized below:  Jane Estrada is an 45 y.o. female with medical history significant of CAD; HIV; HTN; bipolar; granular cell tumor of the trachea; polysubstance abuse; homelessness; and chronic systolic CHF presenting with chest pain.  She reports that she has "been going through it."  Not able to breathe, CHF acting up, fluid on her lungs.  She had an esophageal infection in September and it recurred and that was the beginning of it all.  She came to the hospital with similar symptoms - and she wasn't treated.  2 months later on 12/7 she came back and told her she an esophageal infection.   Assessment/Plan:   Principal Problem:   HIV (human immunodeficiency virus infection) (HCC) Active Problems:   Chest pain   Tobacco dependence   Hypertension   Bipolar affective (HCC)   Polysubstance abuse (HCC)   Pancytopenia (HCC)   Shortness of breath   Thrush   Need for pneumocystis prophylaxis   Heart failure (HCC)   Acute on chronic combined CHF -Patient with known h/o chronic combined CHF (echo in 03/2020 with EF 45-50% and grade 1 diastolic dysfunction) presenting with worsening SOB without hypoxia -CXR consistent with mild pulmonary edema -Elevated BNP -With elevated BNP and abnl CXR, acute decompensated CHF seems probable as diagnosis (or contributing to diagnosis) -Will admit, as per the Emergency HF Mortality Risk Grade.  The patient has: other severe comorbid medical issues. -Will start ASA -ACE/ARB, beta blocker, and spironolactone are recommended as per guideline-directed medical therapy to reduce morbidity/mortality -Was given Lasix 60 mg x 1 in ER and will repeat with 40 mg IV BID -daily labs -echo: Left ventricular ejection fraction, by estimation, is 20%. The left ventricle has severely decreased  function. The left ventricle demonstrates global hypokinesis. The left ventricular internal cavity size was severely dilated. There is mild left ventricular hypertrophy. Left ventricular diastolic parameters are consistent with Grade II diastolic dysfunction (pseudonormalization). Elevated left ventricular end-diastolic pressure.  -not sure consulting cards would be of benefit as she does not follow up- can start entresto here but again will not be helpful if she does not take  Untreated HIV -CD4 <35, viral load pending -ID consult appreciated:   Will arrange follow-up in our clinic with me on 01/04/21 at 315pm  Continue fluconazole for 14 days  Patient agreeable to PCP prophylaxis.  Atovaquone likely better choice given her pancytopenia and report of TMP-SMX "tearing up her stomach"   Oral thrush -Diflucan 200 mg oral daily x 14 days -She is likely a poor current candidate for EGD given untreated AIDS  HTN -Patient acknowledges not taking medication for this issue and is resistant to all medications at this time -She was previously prescribed Norvasc, Hydralazine, Lopressor -Start Cozaar and Coreg (low-dose) for now -Will add prn hydralazine  Homelessness/Social issues -patient moved here about 1 year ago after her husband overdosed and died in [place] -She reports difficulty in finding housing but has not interest in a shelter -She would have significant resources available to her through the HIV Clinic and should be encouraged to seek those out -Has primary psychiatric disease vs. Substance-induced mental health disease; psych was consulted in 03/2020 and offered outpatient resources.   -She does not appear to have active SI/HI and inpatient psych consult is likely  to provide outpatient referrals again  Polysubstance abuse -UDS + for marijuana and amphetamines -Also with h/o cocaine use in the past  Tobacco dependence -Encourage cessation.  -Patch  ordered  pancytopenia -due to HIV   Family Communication/Anticipated D/C date and plan/Code Status   DVT prophylaxis: Lovenox ordered. Code Status: dnr Disposition Plan: Status is: Inpatient  Remains inpatient appropriate because:Inpatient level of care appropriate due to severity of illness   Dispo: The patient is from: Home              Anticipated d/c is to: Home              Anticipated d/c date is: 2 days              Patient currently is not medically stable to d/c.         Medical Consultants:    ID    Subjective:   Does NOT want to take her HIV meds  Objective:    Vitals:   12/22/20 0050 12/22/20 0549 12/22/20 1050 12/22/20 1051  BP: 120/90 122/90 (!) 125/102 (!) 125/102  Pulse: 78 82 86 86  Resp: 17 17    Temp: 97.7 F (36.5 C) 97.7 F (36.5 C)    TempSrc: Oral Oral    SpO2: 99% 96%    Weight:  49.8 kg    Height:       No intake or output data in the 24 hours ending 12/22/20 1110 Filed Weights   12/20/20 2323 12/21/20 1649 12/22/20 0549  Weight: 65 kg 50 kg 49.8 kg    Exam:  General: Appearance:    Thin female in no acute distress     Lungs:      respirations unlabored  Heart:    Normal heart rate. Normal rhythm. No murmurs, rubs, or gallops.   MS:   All extremities are intact.   Neurologic:   Awake, alert, oriented x 3. Poor insight into disease process    Data Reviewed:   I have personally reviewed following labs and imaging studies:  Labs: Labs show the following:   Basic Metabolic Panel: Recent Labs  Lab 12/20/20 2331 12/22/20 0006  NA 135 132*  K 3.9 3.7  CL 102 97*  CO2 23 26  GLUCOSE 102* 104*  BUN 18 22*  CREATININE 1.01* 1.24*  CALCIUM 9.1 8.8*   GFR Estimated Creatinine Clearance: 46 mL/min (A) (by C-G formula based on SCr of 1.24 mg/dL (H)). Liver Function Tests: Recent Labs  Lab 12/21/20 0909  AST 30  ALT 14  ALKPHOS 42  BILITOT 1.0  PROT 7.4  ALBUMIN 3.6   No results for input(s): LIPASE,  AMYLASE in the last 168 hours. No results for input(s): AMMONIA in the last 168 hours. Coagulation profile No results for input(s): INR, PROTIME in the last 168 hours.  CBC: Recent Labs  Lab 12/20/20 2331 12/21/20 0909 12/22/20 0006  WBC 1.2* 1.5* 1.1*  NEUTROABS  --  1.0* 0.7*  HGB 9.0* 9.5* 9.6*  HCT 29.9* 31.9* 29.6*  MCV 91.4 91.4 87.6  PLT 110* 109* 110*   Cardiac Enzymes: No results for input(s): CKTOTAL, CKMB, CKMBINDEX, TROPONINI in the last 168 hours. BNP (last 3 results) No results for input(s): PROBNP in the last 8760 hours. CBG: No results for input(s): GLUCAP in the last 168 hours. D-Dimer: No results for input(s): DDIMER in the last 72 hours. Hgb A1c: No results for input(s): HGBA1C in the last 72 hours. Lipid Profile:  No results for input(s): CHOL, HDL, LDLCALC, TRIG, CHOLHDL, LDLDIRECT in the last 72 hours. Thyroid function studies: No results for input(s): TSH, T4TOTAL, T3FREE, THYROIDAB in the last 72 hours.  Invalid input(s): FREET3 Anemia work up: No results for input(s): VITAMINB12, FOLATE, FERRITIN, TIBC, IRON, RETICCTPCT in the last 72 hours. Sepsis Labs: Recent Labs  Lab 12/20/20 2331 12/21/20 0909 12/22/20 0006  WBC 1.2* 1.5* 1.1*    Microbiology Recent Results (from the past 240 hour(s))  Resp Panel by RT-PCR (Flu A&B, Covid) Nasopharyngeal Swab     Status: None   Collection Time: 12/21/20 12:32 PM   Specimen: Nasopharyngeal Swab; Nasopharyngeal(NP) swabs in vial transport medium  Result Value Ref Range Status   SARS Coronavirus 2 by RT PCR NEGATIVE NEGATIVE Final    Comment: (NOTE) SARS-CoV-2 target nucleic acids are NOT DETECTED.  The SARS-CoV-2 RNA is generally detectable in upper respiratory specimens during the acute phase of infection. The lowest concentration of SARS-CoV-2 viral copies this assay can detect is 138 copies/mL. A negative result does not preclude SARS-Cov-2 infection and should not be used as the sole basis for  treatment or other patient management decisions. A negative result may occur with  improper specimen collection/handling, submission of specimen other than nasopharyngeal swab, presence of viral mutation(s) within the areas targeted by this assay, and inadequate number of viral copies(<138 copies/mL). A negative result must be combined with clinical observations, patient history, and epidemiological information. The expected result is Negative.  Fact Sheet for Patients:  EntrepreneurPulse.com.au  Fact Sheet for Healthcare Providers:  IncredibleEmployment.be  This test is no t yet approved or cleared by the [place] FDA and  has been authorized for detection and/or diagnosis of SARS-CoV-2 by FDA under an Emergency Use Authorization (EUA). This EUA will remain  in effect (meaning this test can be used) for the duration of the COVID-19 declaration under Section 564(b)(1) of the Act, 21 U.S.C.section 360bbb-3(b)(1), unless the authorization is terminated  or revoked sooner.       Influenza A by PCR NEGATIVE NEGATIVE Final   Influenza B by PCR NEGATIVE NEGATIVE Final    Comment: (NOTE) The Xpert Xpress SARS-CoV-2/FLU/RSV plus assay is intended as an aid in the diagnosis of influenza from Nasopharyngeal swab specimens and should not be used as a sole basis for treatment. Nasal washings and aspirates are unacceptable for Xpert Xpress SARS-CoV-2/FLU/RSV testing.  Fact Sheet for Patients: EntrepreneurPulse.com.au  Fact Sheet for Healthcare Providers: IncredibleEmployment.be  This test is not yet approved or cleared by the [place] FDA and has been authorized for detection and/or diagnosis of SARS-CoV-2 by FDA under an Emergency Use Authorization (EUA). This EUA will remain in effect (meaning this test can be used) for the duration of the COVID-19 declaration under Section 564(b)(1) of the Act, 21  U.S.C. section 360bbb-3(b)(1), unless the authorization is terminated or revoked.  Performed at [facility], [place] [address], [place], [place] [address]     Procedures and diagnostic studies:  DG Chest 2 View  Result Date: 12/21/2020 CLINICAL DATA:  Shortness of breath and chest pain EXAM: CHEST - 2 VIEW COMPARISON:  12/06/2020 FINDINGS: Moderate cardiomegaly. No focal airspace consolidation. Mild linear opacity at the right lung base. No pleural effusion or pneumothorax. IMPRESSION: Cardiomegaly without pulmonary edema. Faint opacity at the right lung base, likely atelectasis. Electronically Signed   By: Ulyses Jarred M.D.   On: 12/21/2020 01:14   CT Angio Chest PE W and/or Wo Contrast  Result  Date: 12/21/2020 CLINICAL DATA:  Shortness of breath for 3 weeks. EXAM: CT ANGIOGRAPHY CHEST WITH CONTRAST TECHNIQUE: Multidetector CT imaging of the chest was performed using the standard protocol during bolus administration of intravenous contrast. Multiplanar CT image reconstructions and MIPs were obtained to evaluate the vascular anatomy. CONTRAST:  75 mL OMNIPAQUE IOHEXOL 350 MG/ML SOLN COMPARISON:  PA and lateral chest today. Single-view of the chest 11/30/2020. FINDINGS: Cardiovascular: The study is technically good. No pulmonary embolus is identified. The patient has marked cardiomegaly. No pericardial effusion. No aneurysm. No atherosclerotic calcifications. Mediastinum/Nodes: No enlarged mediastinal, hilar, or axillary lymph nodes. Thyroid gland, trachea, and esophagus demonstrate no significant findings. Lungs/Pleura: No pleural effusion. Ground-glass attenuation is seen in the lower lobes bilaterally and appears symmetric. No nodule or mass. Upper Abdomen: Negative. Musculoskeletal: Negative. Review of the MIP images confirms the above findings. IMPRESSION: Negative for pulmonary embolus. Bilateral lower lobe ground-glass attenuation is nonspecific and could be due to atelectasis, vascular  congestion or pneumonitis. Marked cardiomegaly. Electronically Signed   By: Inge Rise M.D.   On: 12/21/2020 11:16    Medications:   . aspirin EC  81 mg Oral Daily  . [START ON 12/23/2020] atovaquone  1,500 mg Oral Q breakfast  . carvedilol  6.25 mg Oral BID WC  . fluconazole  200 mg Oral Daily  . furosemide  40 mg Intravenous Q12H  . losartan  25 mg Oral Daily  . nicotine  14 mg Transdermal Daily  . sodium chloride flush  3 mL Intravenous Q12H   Continuous Infusions: . sodium chloride       LOS: 1 day   Geradine Girt  [facility]   How to contact the [facility] Attending or Consulting provider [employer] or covering provider during after hours [place], for this patient?  1. Check the care team in [facility] and look for a) attending/consulting TR[facility] provider listed and b) the [facility] team listed 2. Log into [email or web address] and use [place]'s universal password to access. If you do not have the password, please contact the hospital operator. 3. Locate the [facility] provider you are looking for under [facility] and page to a number that you can be directly reached. 4. If you still have difficulty reaching the provider, please page the [facility] (Director on Call) for the Hospitalists listed on amion for assistance.  12/22/2020, 11:10 AM

## 2020-12-22 NOTE — Progress Notes (Signed)
[place] for Infectious Disease  Date of Admission:  12/20/2020           Reason for visit: Follow up on HIV management  Principal Problem:   HIV (human immunodeficiency virus infection) ([place]) Active Problems:   Chest pain   Tobacco dependence   Hypertension   Bipolar affective ([place])   Polysubstance abuse ([place])   Pancytopenia (HCC)   Shortness of breath   Thrush   Need for pneumocystis prophylaxis   Heart failure ([place])   ASSESSMENT:    HIV/AIDS- patient with significant obstacles making adherence to ART problematic including bipolar disorder, substance use, and believe that her prescription medications for HIV will make her worse.  Discussed at length today regarding new ART options and risks of not treating her underlying HIV infections related to her immunosuppression and risk for future complications.  She remains fixed in her belief that she does not want to start ART which she is agreeable to coming to see me in clinic where we can continue her discussions Thrush-currently on fluconazole Heart failure exacerbation Bipolar disorder, substance use disorder Pancytopenia-likely related to her underlying HIV  PLAN:    Will arrange follow-up in our clinic with me on 01/04/21 at 315pm Continue fluconazole for 14 days Patient agreeable to PCP prophylaxis.  Atovaquone likely better choice given her pancytopenia and report of TMP-SMX "tearing up her stomach" Will sign off for now, please call with questions  SUBJECTIVE:   Patient reports still having some shortness of breath but overall improved.  She denies any fevers or chills.  Her cough is unchanged.  She remains adamant that she will not take HIV medicine.  Review of Systems  Constitutional: Negative for chills and fever.  Respiratory: Positive for cough and shortness of breath.   Cardiovascular: Positive for chest pain.  Gastrointestinal: Negative.   Skin: Negative.   Neurological: Negative.       OBJECTIVE:   Blood pressure 122/90, pulse 82, temperature 97.7 F (36.5 C), temperature source Oral, resp. rate 17, height 5\' 4"  (1.626 m), weight 49.8 kg, last menstrual period 11/24/2020, SpO2 96 %. Body mass index is 18.83 kg/m.  Physical Exam Constitutional:      Comments: Thin cachectic appearing woman, lying in bed, no distress  HENT:     Head: Normocephalic and atraumatic.     Mouth/Throat:     Comments: Oral thrush present, dentition is poor Pulmonary:     Effort: Pulmonary effort is normal. No respiratory distress.  Neurological:     General: No focal deficit present.     Mental Status: She is oriented to person, place, and time.  Psychiatric:        Mood and Affect: Mood normal.        Behavior: Behavior normal.     Comments: Poor insight into current medical problems     Lab Results: Lab Results  Component Value Date   WBC 1.1 (LL) 12/22/2020   HGB 9.6 (L) 12/22/2020   HCT 29.6 (L) 12/22/2020   MCV 87.6 12/22/2020   PLT 110 (L) 12/22/2020    Lab Results  Component Value Date   NA 132 (L) 12/22/2020   K 3.7 12/22/2020   CO2 26 12/22/2020   GLUCOSE 104 (H) 12/22/2020   BUN 22 (H) 12/22/2020   CREATININE 1.24 (H) 12/22/2020   CALCIUM 8.8 (L) 12/22/2020   GFRNONAA 55 (L) 12/22/2020   GFRAA >60 08/12/2020    Lab Results  Component Value  Date   ALT 14 12/21/2020   AST 30 12/21/2020   ALKPHOS 42 12/21/2020   BILITOT 1.0 12/21/2020    No results found for: CRP  No results found for: ESRSEDRATE   I have reviewed the micro and lab results in Epic.  Imaging: DG Chest 2 View  Result Date: 12/21/2020 CLINICAL DATA:  Shortness of breath and chest pain EXAM: CHEST - 2 VIEW COMPARISON:  12/06/2020 FINDINGS: Moderate cardiomegaly. No focal airspace consolidation. Mild linear opacity at the right lung base. No pleural effusion or pneumothorax. IMPRESSION: Cardiomegaly without pulmonary edema. Faint opacity at the right lung base, likely atelectasis.  Electronically Signed   By: Ulyses Jarred M.D.   On: 12/21/2020 01:14   CT Angio Chest PE W and/or Wo Contrast  Result Date: 12/21/2020 CLINICAL DATA:  Shortness of breath for 3 weeks. EXAM: CT ANGIOGRAPHY CHEST WITH CONTRAST TECHNIQUE: Multidetector CT imaging of the chest was performed using the standard protocol during bolus administration of intravenous contrast. Multiplanar CT image reconstructions and MIPs were obtained to evaluate the vascular anatomy. CONTRAST:  75 mL OMNIPAQUE IOHEXOL 350 MG/ML SOLN COMPARISON:  PA and lateral chest today. Single-view of the chest 11/30/2020. FINDINGS: Cardiovascular: The study is technically good. No pulmonary embolus is identified. The patient has marked cardiomegaly. No pericardial effusion. No aneurysm. No atherosclerotic calcifications. Mediastinum/Nodes: No enlarged mediastinal, hilar, or axillary lymph nodes. Thyroid gland, trachea, and esophagus demonstrate no significant findings. Lungs/Pleura: No pleural effusion. Ground-glass attenuation is seen in the lower lobes bilaterally and appears symmetric. No nodule or mass. Upper Abdomen: Negative. Musculoskeletal: Negative. Review of the MIP images confirms the above findings. IMPRESSION: Negative for pulmonary embolus. Bilateral lower lobe ground-glass attenuation is nonspecific and could be due to atelectasis, vascular congestion or pneumonitis. Marked cardiomegaly. Electronically Signed   By: Inge Rise M.D.   On: 12/21/2020 11:16      [place] for Infectious Disease [facility] [phone] pager 12/22/2020, 9:45 AM  I spent greater than 35 minutes with the patient including greater than 50% of time in face to face counsel of the patient and in coordination of their care.

## 2020-12-22 NOTE — Progress Notes (Signed)
  Echocardiogram 2D Echocardiogram has been performed.  Jane Estrada 12/22/2020, 11:33 AM

## 2020-12-22 NOTE — TOC Initial Note (Signed)
Transition of Care [facility]) - Initial/Assessment Note    Patient Details  Name: Jane Estrada MRN: [id number] Date of Birth: 17-May-1976  [facility] CM/SW Contact:    Marilu Favre, RN Phone Number: 12/22/2020, 12:06 PM  Clinical Narrative:                  Patient homeless. Came to [place] after her husband passed away. Stated she had to get away from her "toxic" family in [place]. She does not have any friends or family in [place].   She receives mail at [facility] and has seen Marliss Coots in the past.   Offered resources including shelters. Patient states she wants to return to the streets and not go to a shelter. She is waiting on an interview for section 8 housing through [employer] [facility].   She has done paperwork to change Medicaid of [place] to [employer]. She states [facility] [address] for free through her insurance.   Patient consented for NCM to call [facility]. Called spoke to Daine Gip at discharge patient can come to [facility] as a walk in and be seen . IRC closes at 3 pm.   Expected Discharge Plan: [place]     Patient Goals and CMS Choice Patient states their goals for this hospitalization and ongoing recovery are:: to return to [facility] Medicare.gov Compare Post Acute Care list provided to:: Patient    Expected Discharge Plan and Services Expected Discharge Plan: Homeless Shelter                         DME Arranged: N/A         HH Arranged: NA          Prior Living Arrangements/Services   Lives with:: Self Patient language and need for interpreter reviewed:: Yes                 Activities of Daily Living Home Assistive Devices/Equipment: None ADL Screening (condition at time of admission) Patient's cognitive ability adequate to safely complete daily activities?: Yes Is the patient deaf or have difficulty hearing?: No Does the patient have difficulty seeing, even when wearing  glasses/contacts?: No Does the patient have difficulty concentrating, remembering, or making decisions?: No Patient able to express need for assistance with ADLs?: Yes Does the patient have difficulty dressing or bathing?: No Independently performs ADLs?: Yes (appropriate for developmental age) Does the patient have difficulty walking or climbing stairs?: Yes Weakness of Legs: Both Weakness of Arms/Hands: Both  Permission Sought/Granted   Permission granted to share information with : No              Emotional Assessment   Attitude/Demeanor/Rapport: Self-Confident Affect (typically observed): Calm Orientation: : Oriented to Self,Oriented to  Time,Oriented to Place,Oriented to Situation      Admission diagnosis:  Shortness of breath [R06.02] Hypervolemia, unspecified hypervolemia type [E87.70] Heart failure, unspecified HF chronicity, unspecified heart failure type ([place]) [I50.9] Patient Active Problem List   Diagnosis Date Noted  . Shortness of breath 12/21/2020  . Thrush 12/21/2020  . Need for pneumocystis prophylaxis 12/21/2020  . Heart failure ([place])   . Chest pain 03/15/2020  . Polysubstance abuse ([place]) 03/15/2020  . Pancytopenia ([place]) 03/15/2020  . 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 Tobacco dependence   . Hypertension   . HIV (human immunodeficiency virus infection) ([place])   . Granular cell tumor   . Chronic  systolic CHF (congestive heart failure) ([place])   . Bipolar affective ([place])   . History of myocardial infarction 2006   PCP:  Placey, Audrea Muscat, NP Pharmacy:   [id number] [place], [place] [address] [place] [address] Phone: [phone] Fax: [phone]  [facility] [place], [place] [address] [place] [address] Phone: [phone] Fax: [phone]     Social Determinants of Health  (SDOH) Interventions    Readmission Risk Interventions No flowsheet data found.

## 2020-12-23 ENCOUNTER — Encounter (HOSPITAL_COMMUNITY): Payer: Self-pay | Admitting: Internal Medicine

## 2020-12-23 DIAGNOSIS — B20 Human immunodeficiency virus [HIV] disease: Secondary | ICD-10-CM | POA: Diagnosis not present

## 2020-12-23 DIAGNOSIS — I509 Heart failure, unspecified: Secondary | ICD-10-CM | POA: Diagnosis not present

## 2020-12-23 LAB — BASIC METABOLIC PANEL
Anion gap: 11 (ref 5–15)
BUN: 25 mg/dL — ABNORMAL HIGH (ref 6–20)
CO2: 24 mmol/L (ref 22–32)
Calcium: 8.7 mg/dL — ABNORMAL LOW (ref 8.9–10.3)
Chloride: 99 mmol/L (ref 98–111)
Creatinine, Ser: 1.15 mg/dL — ABNORMAL HIGH (ref 0.44–1.00)
GFR, Estimated: >60 mL/min (ref >60)
Glucose, Bld: 102 mg/dL — ABNORMAL HIGH (ref 70–99)
Potassium: 3.4 mmol/L — ABNORMAL LOW (ref 3.5–5.1)
Sodium: 134 mmol/L — ABNORMAL LOW (ref 135–145)

## 2020-12-23 LAB — PATHOLOGIST SMEAR REVIEW

## 2020-12-23 MED ORDER — SACUBITRIL-VALSARTAN 24-26 MG PO TABS
1.0000 | ORAL_TABLET | Freq: Two times a day (BID) | ORAL | Status: DC
  Administered 2020-12-23 – 2020-12-24 (×2): 1 via ORAL
  Filled 2020-12-23 (×3): qty 1

## 2020-12-23 MED ORDER — POTASSIUM CHLORIDE CRYS ER 20 MEQ PO TBCR
40.0000 meq | EXTENDED_RELEASE_TABLET | Freq: Once | ORAL | Status: AC
  Administered 2020-12-23: 40 meq via ORAL
  Filled 2020-12-23: qty 2

## 2020-12-23 NOTE — Progress Notes (Signed)
Progress Note    Jane Estrada Jessie  ZOX:[id number] DOB: September 10, 1976  DOA: 12/20/2020 PCP: Lavinia SharpsPlacey, Mary Ann, NP    Brief Narrative:     Medical records reviewed and are as summarized below:  Jane Estrada Arboleda is an 45 y.o. female with medical history significant of CAD; HIV; HTN; bipolar; granular cell tumor of the trachea; polysubstance abuse; homelessness; and chronic systolic CHF presenting with chest pain.  She reports that she has "been going through it."  Not able to breathe, CHF acting up, fluid on her lungs.  She had an esophageal infection in September and it recurred and that was the beginning of it all.  She came to the hospital with similar symptoms - and she wasn't treated.  2 months later on 12/7 she came back and told her she an esophageal infection.    Assessment/Plan:   Principal Problem:   HIV (human immunodeficiency virus infection) (HCC) Active Problems:   Chest pain   Tobacco dependence   Hypertension   Bipolar affective (HCC)   Polysubstance abuse (HCC)   Pancytopenia (HCC)   Shortness of breath   Thrush   Need for pneumocystis prophylaxis   Heart failure (HCC)   Acute on chronic combined CHF -Patient with known h/o chronic combined CHF (echo in 03/2020 with EF 45-50% and grade 1 diastolic dysfunction) presenting with worsening SOB without hypoxia -CXR consistent with mild pulmonary edema -Elevated BNP - ASA -ACE/ARB, beta blocker, and spironolactone are recommended as per guideline-directed medical therapy to reduce morbidity/mortality -Was given Lasix 60 mg x 1 in ER and will repeat with 40 mg IV BID -daily labs -echo: Left ventricular ejection fraction, by estimation, is 20%. The left ventricle has severely decreased function. The left ventricle demonstrates global hypokinesis. The left ventricular internal cavity size was severely dilated. There is mild left ventricular hypertrophy. Left ventricular diastolic parameters are consistent with Grade II  diastolic dysfunction (pseudonormalization). Elevated left ventricular end-diastolic pressure.  -heart failure support team consult  Untreated HIV -CD4 <35, viral load pending -ID consult appreciated:   Will arrange follow-up in our clinic with me on 01/04/21 at 315pm  Continue fluconazole for 14 days  Patient agreeable to PCP prophylaxis.  Atovaquone likely better choice given her pancytopenia and report of TMP-SMX "tearing up her stomach"   Oral thrush -Diflucan 200 mg oral daily x 14 days -She is likely a poor current candidate for EGD given untreated AIDS  HTN -Patient acknowledges not taking medication for this issue and is resistant to all medications at this time -She was previously prescribed Norvasc, Hydralazine, Lopressor -Start Cozaar and Coreg (low-dose) for now   Homelessness/Social issues -patient moved here about 1 year ago after her husband overdosed and died in [place] -She reports difficulty in finding housing but has not interest in a shelter -She would have significant resources available to her through the HIV Clinic and should be encouraged to seek those out -Has primary psychiatric disease vs. Substance-induced mental health disease; psych was consulted in 03/2020 and offered outpatient resources.   -She does not appear to have active SI/HI and inpatient psych consult is likely to provide outpatient referrals again  Polysubstance abuse -UDS + for marijuana and amphetamines -Also with h/o cocaine use in the past  Tobacco dependence -Encourage cessation.  -Patch ordered  pancytopenia -due to HIV  Hypokalemia -replete  Family Communication/Anticipated D/C date and plan/Code Status   DVT prophylaxis: Lovenox ordered. Code Status: dnr Disposition Plan: Status is: Inpatient  Remains inpatient  appropriate because:Inpatient level of care appropriate due to severity of illness   Dispo: The patient is from: Home              Anticipated d/c is  to: Home              Anticipated d/c date is:1- 2 days              Patient currently is not medically stable to d/c.         Medical Consultants:    ID  chf team    Subjective:   Feels like her breathing is better  Objective:    Vitals:   12/22/20 1050 12/22/20 1051 12/22/20 2339 12/23/20 0500  BP: (!) 125/102 (!) 125/102 116/87 127/88  Pulse: 86 86 76 81  Resp:   16 18  Temp:   98.1 F (36.7 C) 98.2 F (36.8 C)  TempSrc:   Oral Oral  SpO2:   100% 100%  Weight:    49.3 kg  Height:        Intake/Output Summary (Last 24 hours) at 12/23/2020 1337 Last data filed at 12/23/2020 1200 Gross per 24 hour  Intake 840 ml  Output 1800 ml  Net -960 ml   Filed Weights   12/21/20 1649 12/22/20 0549 12/23/20 0500  Weight: 50 kg 49.8 kg 49.3 kg    Exam:   General: Appearance:    Thin female in no acute distress     Lungs:     respirations unlabored  Heart:    Normal heart rate. Normal rhythm. No murmurs, rubs, or gallops.   MS:   All extremities are intact.   Neurologic:   Awake, alert, oriented x 3. No apparent focal neurological           defect.    Data Reviewed:   I have personally reviewed following labs and imaging studies:  Labs: Labs show the following:   Basic Metabolic Panel: Recent Labs  Lab 12/20/20 2331 12/22/20 0006 12/23/20 0325  NA 135 132* 134*  K 3.9 3.7 3.4*  CL 102 97* 99  CO2 23 26 24   GLUCOSE 102* 104* 102*  BUN 18 22* 25*  CREATININE 1.01* 1.24* 1.15*  CALCIUM 9.1 8.8* 8.7*   GFR Estimated Creatinine Clearance: 49.1 mL/min (A) (by C-G formula based on SCr of 1.15 mg/dL (H)). Liver Function Tests: Recent Labs  Lab 12/21/20 0909  AST 30  ALT 14  ALKPHOS 42  BILITOT 1.0  PROT 7.4  ALBUMIN 3.6   No results for input(s): LIPASE, AMYLASE in the last 168 hours. No results for input(s): AMMONIA in the last 168 hours. Coagulation profile No results for input(s): INR, PROTIME in the last 168 hours.  CBC: Recent Labs   Lab 12/20/20 2331 12/21/20 0909 12/22/20 0006  WBC 1.2* 1.5* 1.1*  NEUTROABS  --  1.0* 0.7*  HGB 9.0* 9.5* 9.6*  HCT 29.9* 31.9* 29.6*  MCV 91.4 91.4 87.6  PLT 110* 109* 110*   Cardiac Enzymes: No results for input(s): CKTOTAL, CKMB, CKMBINDEX, TROPONINI in the last 168 hours. BNP (last 3 results) No results for input(s): PROBNP in the last 8760 hours. CBG: No results for input(s): GLUCAP in the last 168 hours. D-Dimer: No results for input(s): DDIMER in the last 72 hours. Hgb A1c: No results for input(s): HGBA1C in the last 72 hours. Lipid Profile: No results for input(s): CHOL, HDL, LDLCALC, TRIG, CHOLHDL, LDLDIRECT in the last 72 hours. Thyroid function studies: No  results for input(s): TSH, T4TOTAL, T3FREE, THYROIDAB in the last 72 hours.  Invalid input(s): FREET3 Anemia work up: No results for input(s): VITAMINB12, FOLATE, FERRITIN, TIBC, IRON, RETICCTPCT in the last 72 hours. Sepsis Labs: Recent Labs  Lab 12/20/20 2331 12/21/20 0909 12/22/20 0006  WBC 1.2* 1.5* 1.1*    Microbiology Recent Results (from the past 240 hour(s))  Resp Panel by RT-PCR (Flu A&B, Covid) Nasopharyngeal Swab     Status: None   Collection Time: 12/21/20 12:32 PM   Specimen: Nasopharyngeal Swab; Nasopharyngeal(NP) swabs in vial transport medium  Result Value Ref Range Status   SARS Coronavirus 2 by RT PCR NEGATIVE NEGATIVE Final    Comment: (NOTE) SARS-CoV-2 target nucleic acids are NOT DETECTED.  The SARS-CoV-2 RNA is generally detectable in upper respiratory specimens during the acute phase of infection. The lowest concentration of SARS-CoV-2 viral copies this assay can detect is 138 copies/mL. A negative result does not preclude SARS-Cov-2 infection and should not be used as the sole basis for treatment or other patient management decisions. A negative result may occur with  improper specimen collection/handling, submission of specimen other than nasopharyngeal swab, presence of  viral mutation(s) within the areas targeted by this assay, and inadequate number of viral copies(<138 copies/mL). A negative result must be combined with clinical observations, patient history, and epidemiological information. The expected result is Negative.  Fact Sheet for Patients:  EntrepreneurPulse.com.au  Fact Sheet for Healthcare Providers:  IncredibleEmployment.be  This test is no t yet approved or cleared by the [place] FDA and  has been authorized for detection and/or diagnosis of SARS-CoV-2 by FDA under an Emergency Use Authorization (EUA). This EUA will remain  in effect (meaning this test can be used) for the duration of the COVID-19 declaration under Section 564(b)(1) of the Act, 21 U.S.C.section 360bbb-3(b)(1), unless the authorization is terminated  or revoked sooner.       Influenza A by PCR NEGATIVE NEGATIVE Final   Influenza B by PCR NEGATIVE NEGATIVE Final    Comment: (NOTE) The Xpert Xpress SARS-CoV-2/FLU/RSV plus assay is intended as an aid in the diagnosis of influenza from Nasopharyngeal swab specimens and should not be used as a sole basis for treatment. Nasal washings and aspirates are unacceptable for Xpert Xpress SARS-CoV-2/FLU/RSV testing.  Fact Sheet for Patients: EntrepreneurPulse.com.au  Fact Sheet for Healthcare Providers: IncredibleEmployment.be  This test is not yet approved or cleared by the [place] FDA and has been authorized for detection and/or diagnosis of SARS-CoV-2 by FDA under an Emergency Use Authorization (EUA). This EUA will remain in effect (meaning this test can be used) for the duration of the COVID-19 declaration under Section 564(b)(1) of the Act, 21 U.S.C. section 360bbb-3(b)(1), unless the authorization is terminated or revoked.  Performed at [facility], [place] [address] [place], [place] [address]     Procedures and diagnostic  studies:  ECHOCARDIOGRAM COMPLETE  Result Date: 12/22/2020    ECHOCARDIOGRAM REPORT   Patient Name:   TERAN Mcmaster Date of Exam: 12/22/2020 Medical Rec #:  [id number]     Height:       64.0 in Accession #:    [id number]    Weight:       109.7 lb Date of Birth:  [birth year]    BSA:          1.516 m Patient Age:    3 years      BP:           122/90 mmHg  Patient Gender: F             HR:           89 bpm. Exam Location:  Inpatient Procedure: 2D Echo, 3D Echo, Cardiac Doppler and Color Doppler Indications:    CHF-Acute Systolic  History:        Patient has prior history of Echocardiogram examinations, most                 recent 03/15/2020. Signs/Symptoms:Shortness of Breath; Risk                 Factors:Current Smoker and Hypertension. Polysubstance abuse.  Sonographer:    Clayton Lefort RDCS (AE) Referring Phys: [place]  1. Left ventricular ejection fraction, by estimation, is 20%. The left ventricle has severely decreased function. The left ventricle demonstrates global hypokinesis. The left ventricular internal cavity size was severely dilated. There is mild left ventricular hypertrophy. Left ventricular diastolic parameters are consistent with Grade II diastolic dysfunction (pseudonormalization). Elevated left ventricular end-diastolic pressure.  2. Right ventricular systolic function is normal. The right ventricular size is normal.  3. Left atrial size was mildly dilated.  4. The mitral valve is normal in structure. Mild mitral valve regurgitation. No evidence of mitral stenosis.  5. The aortic valve is normal in structure. Aortic valve regurgitation is not visualized. No aortic stenosis is present.  6. The inferior vena cava is normal in size with greater than 50% respiratory variability, suggesting right atrial pressure of 3 mmHg. FINDINGS  Left Ventricle: Left ventricular ejection fraction, by estimation, is 20%. The left ventricle has severely decreased function. The left ventricle  demonstrates global hypokinesis. The left ventricular internal cavity size was severely dilated. There is mild left ventricular hypertrophy. Left ventricular diastolic parameters are consistent with Grade II diastolic dysfunction (pseudonormalization). Elevated left ventricular end-diastolic pressure. Right Ventricle: The right ventricular size is normal. No increase in right ventricular wall thickness. Right ventricular systolic function is normal. Left Atrium: Left atrial size was mildly dilated. Right Atrium: Right atrial size was normal in size. Pericardium: Trivial pericardial effusion is present. Mitral Valve: The mitral valve is normal in structure. Mild mitral valve regurgitation. No evidence of mitral valve stenosis. Tricuspid Valve: The tricuspid valve is normal in structure. Tricuspid valve regurgitation is mild . No evidence of tricuspid stenosis. Aortic Valve: The aortic valve is normal in structure. Aortic valve regurgitation is not visualized. No aortic stenosis is present. Aortic valve mean gradient measures 1.0 mmHg. Aortic valve peak gradient measures 1.8 mmHg. Aortic valve area, by VTI measures 3.36 cm. Pulmonic Valve: The pulmonic valve was normal in structure. Pulmonic valve regurgitation is not visualized. No evidence of pulmonic stenosis. Aorta: The aortic root is normal in size and structure. Venous: The inferior vena cava is normal in size with greater than 50% respiratory variability, suggesting right atrial pressure of 3 mmHg. IAS/Shunts: No atrial level shunt detected by color flow Doppler.  LEFT VENTRICLE PLAX 2D LVIDd:         5.80 cm  Diastology LVIDs:         5.20 cm  LV e' medial:    5.02 cm/s LV PW:         1.40 cm  LV E/e' medial:  14.2 LV IVS:        1.40 cm  LV e' lateral:   4.88 cm/s LVOT diam:     2.30 cm  LV E/e' lateral: 14.6 LV SV:  32 LV SV Index:   21 LVOT Area:     4.15 cm  RIGHT VENTRICLE            IVC RV Basal diam:  3.50 cm    IVC diam: 1.40 cm RV S prime:      7.01 cm/s TAPSE (M-mode): 1.7 cm LEFT ATRIUM             Index       RIGHT ATRIUM           Index LA diam:        3.90 cm 2.57 cm/m  RA Area:     15.70 cm LA Vol (A2C):   98.1 ml 64.72 ml/m RA Volume:   46.60 ml  30.75 ml/m LA Vol (A4C):   79.1 ml 52.19 ml/m LA Biplane Vol: 88.9 ml 58.65 ml/m  AORTIC VALVE AV Area (Vmax):    3.08 cm AV Area (Vmean):   2.76 cm AV Area (VTI):     3.36 cm AV Vmax:           68.00 cm/s AV Vmean:          53.000 cm/s AV VTI:            0.094 m AV Peak Grad:      1.8 mmHg AV Mean Grad:      1.0 mmHg LVOT Vmax:         50.40 cm/s LVOT Vmean:        35.200 cm/s LVOT VTI:          0.076 m LVOT/AV VTI ratio: 0.81  AORTA Ao Root diam: 3.10 cm Ao Asc diam:  2.70 cm MITRAL VALVE MV Area (PHT): 2.87 cm    SHUNTS MV Decel Time: 264 msec    Systemic VTI:  0.08 m MV E velocity: 71.10 cm/s  Systemic Diam: 2.30 cm Jenkins Rouge MD Electronically signed by Jenkins Rouge MD Signature Date/Time: 12/22/2020/12:18:13 PM    Final     Medications:   . aspirin EC  81 mg Oral Daily  . atovaquone  1,500 mg Oral Q breakfast  . carvedilol  6.25 mg Oral BID WC  . fluconazole  200 mg Oral Daily  . furosemide  40 mg Intravenous Q12H  . losartan  25 mg Oral Daily  . nicotine  14 mg Transdermal Daily  . sodium chloride flush  3 mL Intravenous Q12H   Continuous Infusions: . sodium chloride       LOS: 2 days   Geradine Girt  [facility]   How to contact the [facility] Attending or Consulting provider [place] or covering provider during after hours [place], for this patient?  1. Check the care team in [facility] and look for a) attending/consulting TR[facility] provider listed and b) the [facility] team listed 2. Log into [email or web address] and use [place]'s universal password to access. If you do not have the password, please contact the hospital operator. 3. Locate the [facility] provider you are looking for under [facility] and page to a number that you can be directly reached. 4. If you still have  difficulty reaching the provider, please page the [facility] (Director on Call) for the Hospitalists listed on amion for assistance.  12/23/2020, 1:37 PM

## 2020-12-23 NOTE — Progress Notes (Signed)
Heart Failure Nurse Navigator Progress Note  PCP: Placey, Audrea Muscat, NP PCP-Cardiologist: none Admission Diagnosis: chest wall pain Admitted from: currently experiencing homelessness  Presentation:   Jane Estrada is an 45 y.o. female with medical history significant ofCAD; HIV since 04/02/05; HTN; bipolar; granular cell tumor of the trachea; polysubstance abuse; homelessness; and chronic systolic CHF presenting with chest pain.She reports that she has "been going through it." Not able to breathe, CHF acting up, fluid on her lungs. She had an esophageal infection in September and it recurred and that was the beginning of it all. She came to the hospital with similar symptoms - and she wasn't treated. 2 months later on 12/7 she came back and told her she an esophageal infection. Moved from [place] to [place] 2020/04/02, "toxic family in [place]". Spouse died of OD in 04/03/2019. Smokes cigarettes and marijuana daily, used to do crack-last use 12/2019.   ECHO/ LVEF: 12/22/2020 <20%. 03/15/2020 45-50%  Clinical Course:  Past Medical History:  Diagnosis Date  . Bipolar affective ([place])   . Chronic systolic CHF (congestive heart failure) ([place])   . Granular cell tumor   . HIV (human immunodeficiency virus infection) ([place])   . Hypertension   . Myocardial infarct ([place]) 04-02-05  . Tobacco dependence      Social History   Socioeconomic History  . Marital status: Widowed    Spouse name: Not on file  . Number of children: 2  . Years of education: Not on file  . Highest education level: Not on file  Occupational History  . Occupation: unemployed  Tobacco Use  . Smoking status: Current Every Day Smoker    Packs/day: 0.50    Years: 30.00    Pack years: 15.00    Types: Cigarettes  . Smokeless tobacco: Never Used  Vaping Use  . Vaping Use: Never used  Substance and Sexual Activity  . Alcohol use: Not Currently    Comment: last drink in 02/2020  . Drug use: Yes    Frequency: 1.0 times per week    Types:  Marijuana, "Crack" cocaine    Comment: daily marijuana use; denies using other drugs but previously used crack  . Sexual activity: Not Currently  Other Topics Concern  . Not on file  Social History Narrative  . Not on file   Social Determinants of Health   Financial Resource Strain: High Risk  . Difficulty of Paying Living Expenses: Very hard  Food Insecurity: No Food Insecurity  . Worried About Charity fundraiser in the Last Year: Never true  . Ran Out of Food in the Last Year: Never true  Transportation Needs: Unmet Transportation Needs  . Lack of Transportation (Medical): Yes  . Lack of Transportation (Non-Medical): Yes  Physical Activity: Not on file  Stress: Not on file  Social Connections: Moderately Isolated  . Frequency of Communication with Friends and Family: Three times a week  . Frequency of Social Gatherings with Friends and Family: Three times a week  . Attends Religious Services: Never  . Active Member of Clubs or Organizations: Yes  . Attends Archivist Meetings: Never  . Marital Status: Widowed   High Risk Criteria for Readmission and/or Poor Patient Outcomes:  Heart failure hospital admissions (last 6 months): 1 admit/ 8 ED visits   No Show rate: 10%  Difficult social situation: experiencing homelessness. Has "toxic relationship with family back in [place]", fixed income with SSI of $841/mo. Unemployed, uninsured.   Demonstrates medication adherence: no, refuses  HIV medications, willing to take CHF medications.   Primary Language: English  Literacy level: Able to read and write. Concern for comprehension/capcity. Hx bipolar- declines to take psych meds. Pt would talk in a circle, get off topic and laugh inappropriately during conversation, possible defense mechanism.   Barriers of Care:   Experiencing homelessness- states she is working with partners to end homelessness (Janett Billow is case worker, per pt. Statement.) Refuses shelters, "not a people  person" Concern for comprehension/capcity- unable to stay on task. Inappropriate emotions to conversation topics.  Medication adherence/compliancy- does not follow medical advice, understand importance of medication regimens. Financial strain-limited income Transportation- [employer] card?, friends for transportation. Uncontrolled HIV and mental health issues- lacks understanding how medical processes work together.  Insurance- potentially pending medicaid?   Considerations/Referrals:   Referral made to Heart Failure Pharmacist Stewardship: yes, appreciated Referral made to Heart Impact Team RNCM/CSW: yes, appreciated. Difficult social situations.  Pricilla Holm, RN, BSN Heart Failure Nurse Navigator [facility] [phone]

## 2020-12-23 NOTE — Progress Notes (Signed)
Heart Failure Stewardship Pharmacist Progress Note   PCP: Placey, Audrea Muscat, NP PCP-Cardiologist: No primary care provider on file.    HPI:  45 YO female with PMH of CAD, CHF, HIV, HTN, polysubstance abuse, and homelessness. Presented to ED with trouble breathing and chest pain. Patient has not been adherence to home medications. Description consistent with severe orthopnea. ECHO completed on 12/22/20 showing an EF of 20%.   Current HF Medications: Furosemide 40 mg IV BID Carvedilol 6.25 mg BID Losartan 25 mg daily  Prior to admission HF Medications: *Not taking these medications, but on PTA list Furosemide 20 mg daily x 5 days Metoprolol tartrate 12.5 mg BID  Hydralazine 25 mg TID  Pertinent Lab Values: . Serum creatinine 1.15, BUN 25, Potassium 3.4, Sodium 134, BNP 789  Vital Signs: . Weight: 108 lbs (admission weight: 143 lbs) . Blood pressure: 120/80s . Heart rate: 80s . Urine output: 1 L yesterday   Medication Assistance / Insurance Benefits Check: Does the patient have prescription insurance?  Yes Type of insurance plan: [place] Medicaid (working on transitioning to [facility])  Outpatient Pharmacy:  [facility] Is the patient willing to use [place] at discharge? Yes Is the patient willing to transition their outpatient pharmacy to utilize a [facility] outpatient pharmacy?   Pending    Assessment: 1. Acute on chronic systolic CHF (EF 08%), with multifactoral etiolgy and medication noncompliance. NYHA class II symptoms. - Continue furosemide 40 mg IV q12h - Continue carvedilol 6.25 mg BID - Consider switching losartan to Entresto 24/26 mg BID prior to discharge - Consider adding spironolactone +/- SGLT2i pending BP and SCr trends  2. Medication noncompliance - Patient expresses that she is homeless, without current [place] Medicaid (pending approval) and she refuses to go to a shelter.  - She is willing to start taking medications  for HF but is refusing to take any HIV medications even after education on therapy. She states that in the past she got sick after taking HIV meds and is no longer interested in discussing therapy options.  - [facility] and she has only picked up Bactrim and nystatin (all other meds were on hold) so she has been off chronic therapy for an unknown amount of time. - Per EPIC dispense record, she has last filled lasix on 09/06/20 x 30 day supply.   Plan: 1) Medication changes recommended at this time: - Continue current regimen as noted above  2) Patient assistance application(s) / nonadherence: - Consulted HF RN navigator - Will need referral to [facility] to establish PCP and can utilize pharmacy financial resources pending Medicaid approval - I am concerned that she does not have an active residence for her to receive deliveries of medications (if we were to start [facility] and need temporary pt assistance while waiting on Medicaid approval). Will continue to discuss options with the patient to determine how we can ensure medication access as an outpatient.   3)  Education  - To be completed prior to discharge  Kerby Nora, PharmD, BCPS Heart Failure Stewardship Pharmacist Phone [phone]

## 2020-12-23 NOTE — Progress Notes (Signed)
SATURATION QUALIFICATIONS: (This note is used to comply with regulatory documentation for home oxygen)  Patient Saturations on Room Air at Rest = 97 %  Patient Saturations on Room Air while Ambulating = 96 %   

## 2020-12-24 ENCOUNTER — Inpatient Hospital Stay (HOSPITAL_COMMUNITY): Payer: Medicaid - Out of State

## 2020-12-24 DIAGNOSIS — B20 Human immunodeficiency virus [HIV] disease: Secondary | ICD-10-CM | POA: Diagnosis not present

## 2020-12-24 DIAGNOSIS — I509 Heart failure, unspecified: Secondary | ICD-10-CM | POA: Diagnosis not present

## 2020-12-24 DIAGNOSIS — Z298 Encounter for other specified prophylactic measures: Secondary | ICD-10-CM | POA: Diagnosis not present

## 2020-12-24 LAB — BASIC METABOLIC PANEL
Anion gap: 11 (ref 5–15)
BUN: 24 mg/dL — ABNORMAL HIGH (ref 6–20)
CO2: 25 mmol/L (ref 22–32)
Calcium: 8.7 mg/dL — ABNORMAL LOW (ref 8.9–10.3)
Chloride: 100 mmol/L (ref 98–111)
Creatinine, Ser: 1.2 mg/dL — ABNORMAL HIGH (ref 0.44–1.00)
GFR, Estimated: 58 mL/min — ABNORMAL LOW (ref >60)
Glucose, Bld: 110 mg/dL — ABNORMAL HIGH (ref 70–99)
Potassium: 4.2 mmol/L (ref 3.5–5.1)
Sodium: 136 mmol/L (ref 135–145)

## 2020-12-24 MED ORDER — SACUBITRIL-VALSARTAN 24-26 MG PO TABS
1.0000 | ORAL_TABLET | Freq: Two times a day (BID) | ORAL | Status: DC
  Administered 2020-12-25 – 2020-12-29 (×9): 1 via ORAL
  Filled 2020-12-24 (×10): qty 1

## 2020-12-24 NOTE — Progress Notes (Signed)
Heart Failure Stewardship Pharmacist Progress Note   PCP: Placey, Audrea Muscat, NP PCP-Cardiologist: No primary care provider on file.    HPI:  45 YO female with PMH of CAD, CHF, HIV, HTN, polysubstance abuse, and homelessness. Presented to ED with trouble breathing and chest pain. Patient has not been adherence to home medications. Description consistent with severe orthopnea. ECHO completed on 12/22/20 showing an EF of 20%.   Current HF Medications: Carvedilol 6.25 mg BID Entresto 24/26 mg BID  Prior to admission HF Medications: *Not taking these medications, but on PTA list Furosemide 20 mg daily x 5 days Metoprolol tartrate 12.5 mg BID  Hydralazine 25 mg TID  Pertinent Lab Values: . Serum creatinine 1.20, BUN 24, Potassium 4.2, Sodium 136, BNP 789  Vital Signs: . Weight: 109 lbs (admission weight: 143 lbs) . Blood pressure: 90-110/60-80s . Heart rate: 70s . Urine output: 2.1 L yesterday   Medication Assistance / Insurance Benefits Check: Does the patient have prescription insurance?  Yes Type of insurance plan: [place] Medicaid (working on transitioning to [facility])  Outpatient Pharmacy:  [facility] Is the patient willing to use [facility] at discharge? Yes Is the patient willing to transition their outpatient pharmacy to utilize a [facility] outpatient pharmacy?   Pending    Assessment: 1. Acute on chronic systolic CHF (EF 17%), with multifactoral etiolgy and medication noncompliance. NYHA class II symptoms. - Stopped IV lasix today - Continue carvedilol 6.25 mg BID - Continue Entresto 24/26 mg BID - will work on pt assistance for her - Consider adding spironolactone +/- SGLT2i pending BP and SCr trends  2. Medication noncompliance - Patient expresses that she is homeless, without current [place] Medicaid (pending approval) and she refuses to go to a shelter.  - She is willing to start taking medications for HF but is refusing to take  any HIV medications even after education on therapy. She states that in the past she got sick after taking HIV meds and is no longer interested in discussing therapy options.  - [facility] and she has only picked up Bactrim and nystatin (all other meds were on hold) so she has been off chronic therapy for an unknown amount of time. - Per EPIC dispense record, she has last filled lasix on 09/06/20 x 30 day supply.   Plan: 1) Medication changes recommended at this time: - Continue current regimen as noted above  2) Patient assistance application(s) / nonadherence: - Consulted HF RN navigator - Will need referral to [facility] to establish PCP and can utilize pharmacy financial resources pending Medicaid approval - Will begin working on pt assistance for [employer] while Medicaid is pending - will need to establish an address that [employer] will be able to mail her prescriptions to.   3)  Education  - To be completed prior to discharge  Kerby Nora, PharmD, BCPS Heart Failure Stewardship Pharmacist Phone [phone]

## 2020-12-24 NOTE — Progress Notes (Signed)
Pt called front desk and stated that she needed to use the BR. RN entered the room to help assist the patient, pt ambulated to the BR well. Shortly after sitting on the commode the patient had a blank stare and and started drooling and was not responsive. Alma Friendly, RN entered the room to help [facility] was called and MD notified. MD came and saw pt and placed orders stated that she was orthostatic and would change her orders.

## 2020-12-24 NOTE — Progress Notes (Signed)
Progress Note    Jane Estrada  QBH:[id number] DOB: 04/11/1976  DOA: 12/20/2020 PCP: Marliss Coots, NP    Brief Narrative:     Medical records reviewed and are as summarized below:  Jane Estrada is an 45 y.o. female with medical history significant of CAD; HIV; HTN; bipolar; granular cell tumor of the trachea; polysubstance abuse; homelessness; and chronic systolic CHF presenting with chest pain.  She reports that she has "been going through it."  Not able to breathe, CHF acting up, fluid on her lungs.  She had an esophageal infection in September and it recurred and that was the beginning of it all.  She came to the hospital with similar symptoms - and she wasn't treated.  2 months later on 12/7 she came back and told her she an esophageal infection.    Assessment/Plan:   Principal Problem:   HIV (human immunodeficiency virus infection) ([place]) Active Problems:   Chest pain   Tobacco dependence   Hypertension   Bipolar affective ([place])   Polysubstance abuse ([place])   Pancytopenia (HCC)   Shortness of breath   Thrush   Need for pneumocystis prophylaxis   Heart failure (HCC)   Acute on chronic combined CHF -Patient with known h/o chronic combined CHF (echo in 03/2020 with EF 45-50% and grade 1 diastolic dysfunction) presenting with worsening SOB without hypoxia -CXR consistent with mild pulmonary edema -Elevated BNP - ASA -Was given Lasix 60 mg x 1 in ER and and 40 mg IV BID-- will stop due to orthostatics and syncopal event -daily labs -echo: Left ventricular ejection fraction, by estimation, is 20%. The left ventricle has severely decreased function. The left ventricle demonstrates global hypokinesis. The left ventricular internal cavity size was severely dilated. There is mild left ventricular hypertrophy. Left ventricular diastolic parameters are consistent with Grade II diastolic dysfunction (pseudonormalization). Elevated left ventricular end-diastolic pressure.   -heart failure support team consult -started entresto -d/c lasix  Syncopal event -BP low: SBP <80 -d/c lasix -recheck in AM -tele showed bradycardia around 50 no pauses  Untreated HIV -CD4 <35, viral load pending -ID consult appreciated:   Will arrange follow-up in our clinic with me on 01/04/21 at 315pm  Continue fluconazole for 14 days  Patient agreeable to PCP prophylaxis.  Atovaquone likely better choice given her pancytopenia and report of TMP-SMX "tearing up her stomach"   Oral thrush -Diflucan 200 mg oral daily x 14 days -She is likely a poor current candidate for EGD given untreated AIDS  HTN -Patient acknowledges not taking medication for this issue and is resistant to all medications at this time -She was previously prescribed Norvasc, Hydralazine, Lopressor - Coreg (low-dose) for now   Homelessness/Social issues -patient moved here about 1 year ago after her husband overdosed and died in [place] -She reports difficulty in finding housing but has not interest in a shelter -She would have significant resources available to her through the [facility] and should be encouraged to seek those out -Has primary psychiatric disease vs. Substance-induced mental health disease; psych was consulted in 03/2020 and offered outpatient resources.   -She does not appear to have active SI/HI and inpatient psych consult is likely to provide outpatient referrals again  Polysubstance abuse -UDS + for marijuana and amphetamines -Also with h/o cocaine use in the past  Tobacco dependence -Encourage cessation.  -Patch ordered  pancytopenia -due to HIV  Hypokalemia -replete  Family Communication/Anticipated D/C date and plan/Code Status   DVT prophylaxis: Lovenox  ordered. Code Status: dnr Disposition Plan: Status is: Inpatient  Remains inpatient appropriate because:Inpatient level of care appropriate due to severity of illness   Dispo: The patient is from: Home               Anticipated d/c is to: Home              Anticipated d/c date is:1- 2 days              Patient currently is not medically stable to d/c.         Medical Consultants:    ID  chf team    Subjective:   Had a syncopal event in the bathroom C/o right sided groin pain  Objective:    Vitals:   12/24/20 0018 12/24/20 0407 12/24/20 1150 12/24/20 1249  BP: (!) 120/95 117/84 (!) 86/69 100/79  Pulse: 71 64  70  Resp: 16 15  16   Temp: 97.7 F (36.5 C) 98 F (36.7 C)  (!) 97.5 F (36.4 C)  TempSrc: Oral Oral  Oral  SpO2: 100%   100%  Weight:  49.6 kg    Height:        Intake/Output Summary (Last 24 hours) at 12/24/2020 1425 Last data filed at 12/24/2020 0611 Gross per 24 hour  Intake 840 ml  Output 900 ml  Net -60 ml   Filed Weights   12/22/20 0549 12/23/20 0500 12/24/20 0407  Weight: 49.8 kg 49.3 kg 49.6 kg    Exam:  General: Appearance:    Thin female in no acute distress     Lungs:     respirations unlabored, productive cough  Heart:    Normal heart rate.  MS:   All extremities are intact.   Neurologic:   Awake, alert, oriented x 3. No apparent focal neurological           defect.     Data Reviewed:   I have personally reviewed following labs and imaging studies:  Labs: Labs show the following:   Basic Metabolic Panel: Recent Labs  Lab 12/20/20 2331 12/22/20 0006 12/23/20 0325 12/24/20 0356  NA 135 132* 134* 136  K 3.9 3.7 3.4* 4.2  CL 102 97* 99 100  CO2 23 26 24 25   GLUCOSE 102* 104* 102* 110*  BUN 18 22* 25* 24*  CREATININE 1.01* 1.24* 1.15* 1.20*  CALCIUM 9.1 8.8* 8.7* 8.7*   GFR Estimated Creatinine Clearance: 47.3 mL/min (A) (by C-G formula based on SCr of 1.2 mg/dL (H)). Liver Function Tests: Recent Labs  Lab 12/21/20 0909  AST 30  ALT 14  ALKPHOS 42  BILITOT 1.0  PROT 7.4  ALBUMIN 3.6   No results for input(s): LIPASE, AMYLASE in the last 168 hours. No results for input(s): AMMONIA in the last 168  hours. Coagulation profile No results for input(s): INR, PROTIME in the last 168 hours.  CBC: Recent Labs  Lab 12/20/20 2331 12/21/20 0909 12/22/20 0006  WBC 1.2* 1.5* 1.1*  NEUTROABS  --  1.0* 0.7*  HGB 9.0* 9.5* 9.6*  HCT 29.9* 31.9* 29.6*  MCV 91.4 91.4 87.6  PLT 110* 109* 110*   Cardiac Enzymes: No results for input(s): CKTOTAL, CKMB, CKMBINDEX, TROPONINI in the last 168 hours. BNP (last 3 results) No results for input(s): PROBNP in the last 8760 hours. CBG: No results for input(s): GLUCAP in the last 168 hours. D-Dimer: No results for input(s): DDIMER in the last 72 hours. Hgb A1c: No results for input(s): HGBA1C  in the last 72 hours. Lipid Profile: No results for input(s): CHOL, HDL, LDLCALC, TRIG, CHOLHDL, LDLDIRECT in the last 72 hours. Thyroid function studies: No results for input(s): TSH, T4TOTAL, T3FREE, THYROIDAB in the last 72 hours.  Invalid input(s): FREET3 Anemia work up: No results for input(s): VITAMINB12, FOLATE, FERRITIN, TIBC, IRON, RETICCTPCT in the last 72 hours. Sepsis Labs: Recent Labs  Lab 12/20/20 2331 12/21/20 0909 12/22/20 0006  WBC 1.2* 1.5* 1.1*    Microbiology Recent Results (from the past 240 hour(s))  Resp Panel by RT-PCR (Flu A&B, Covid) Nasopharyngeal Swab     Status: None   Collection Time: 12/21/20 12:32 PM   Specimen: Nasopharyngeal Swab; Nasopharyngeal(NP) swabs in vial transport medium  Result Value Ref Range Status   SARS Coronavirus 2 by RT PCR NEGATIVE NEGATIVE Final    Comment: (NOTE) SARS-CoV-2 target nucleic acids are NOT DETECTED.  The SARS-CoV-2 RNA is generally detectable in upper respiratory specimens during the acute phase of infection. The lowest concentration of SARS-CoV-2 viral copies this assay can detect is 138 copies/mL. A negative result does not preclude SARS-Cov-2 infection and should not be used as the sole basis for treatment or other patient management decisions. A negative result may occur  with  improper specimen collection/handling, submission of specimen other than nasopharyngeal swab, presence of viral mutation(s) within the areas targeted by this assay, and inadequate number of viral copies(<138 copies/mL). A negative result must be combined with clinical observations, patient history, and epidemiological information. The expected result is Negative.  Fact Sheet for Patients:  EntrepreneurPulse.com.au  Fact Sheet for Healthcare Providers:  IncredibleEmployment.be  This test is no t yet approved or cleared by the [place] FDA and  has been authorized for detection and/or diagnosis of SARS-CoV-2 by FDA under an Emergency Use Authorization (EUA). This EUA will remain  in effect (meaning this test can be used) for the duration of the COVID-19 declaration under Section 564(b)(1) of the Act, 21 U.S.C.section 360bbb-3(b)(1), unless the authorization is terminated  or revoked sooner.       Influenza A by PCR NEGATIVE NEGATIVE Final   Influenza B by PCR NEGATIVE NEGATIVE Final    Comment: (NOTE) The Xpert Xpress SARS-CoV-2/FLU/RSV plus assay is intended as an aid in the diagnosis of influenza from Nasopharyngeal swab specimens and should not be used as a sole basis for treatment. Nasal washings and aspirates are unacceptable for Xpert Xpress SARS-CoV-2/FLU/RSV testing.  Fact Sheet for Patients: EntrepreneurPulse.com.au  Fact Sheet for Healthcare Providers: IncredibleEmployment.be  This test is not yet approved or cleared by the [place] FDA and has been authorized for detection and/or diagnosis of SARS-CoV-2 by FDA under an Emergency Use Authorization (EUA). This EUA will remain in effect (meaning this test can be used) for the duration of the COVID-19 declaration under Section 564(b)(1) of the Act, 21 U.S.C. section 360bbb-3(b)(1), unless the authorization is terminated  or revoked.  Performed at [facility], [place] [address] [place], [place] [address]     Procedures and diagnostic studies:  DG HIP UNILAT WITH PELVIS 2-3 VIEWS RIGHT  Result Date: 12/24/2020 CLINICAL DATA:  Right hip pain for 2 days. EXAM: DG HIP (WITH OR WITHOUT PELVIS) 2-3V RIGHT COMPARISON:  None. FINDINGS: There is no evidence of hip fracture or dislocation. There is no evidence of arthropathy or other focal bone abnormality. IMPRESSION: Negative. Electronically Signed   By: Misty Stanley M.D.   On: 12/24/2020 13:45    Medications:   . aspirin  EC  81 mg Oral Daily  . atovaquone  1,500 mg Oral Q breakfast  . carvedilol  6.25 mg Oral BID WC  . fluconazole  200 mg Oral Daily  . nicotine  14 mg Transdermal Daily  . [START ON 12/25/2020] sacubitril-valsartan  1 tablet Oral BID  . sodium chloride flush  3 mL Intravenous Q12H   Continuous Infusions: . sodium chloride       LOS: 3 days   Geradine Girt  [facility]   How to contact the [facility] Attending or Consulting provider [place] or covering provider during after hours [place], for this patient?  1. Check the care team in [facility] and look for a) attending/consulting TR[facility] provider listed and b) the [facility] team listed 2. Log into [email or web address] and use [employer]'s universal password to access. If you do not have the password, please contact the hospital operator. 3. Locate the [facility] provider you are looking for under [facility] and page to a number that you can be directly reached. 4. If you still have difficulty reaching the provider, please page the [facility] (Director on Call) for the Hospitalists listed on amion for assistance.  12/24/2020, 2:25 PM

## 2020-12-25 DIAGNOSIS — I509 Heart failure, unspecified: Secondary | ICD-10-CM | POA: Diagnosis not present

## 2020-12-25 DIAGNOSIS — I10 Essential (primary) hypertension: Secondary | ICD-10-CM | POA: Diagnosis not present

## 2020-12-25 DIAGNOSIS — B20 Human immunodeficiency virus [HIV] disease: Secondary | ICD-10-CM | POA: Diagnosis not present

## 2020-12-25 LAB — CBC
HCT: 34.8 % — ABNORMAL LOW (ref 36.0–46.0)
Hemoglobin: 10.4 g/dL — ABNORMAL LOW (ref 12.0–15.0)
MCH: 26.9 pg (ref 26.0–34.0)
MCHC: 29.9 g/dL — ABNORMAL LOW (ref 30.0–36.0)
MCV: 90.2 fL (ref 80.0–100.0)
Platelets: 117 10*3/uL — ABNORMAL LOW (ref 150–400)
RBC: 3.86 MIL/uL — ABNORMAL LOW (ref 3.87–5.11)
RDW: 15.5 % (ref 11.5–15.5)
WBC: 1.5 10*3/uL — ABNORMAL LOW (ref 4.0–10.5)
nRBC: 0 % (ref 0.0–0.2)

## 2020-12-25 LAB — BASIC METABOLIC PANEL
Anion gap: 11 (ref 5–15)
BUN: 26 mg/dL — ABNORMAL HIGH (ref 6–20)
CO2: 25 mmol/L (ref 22–32)
Calcium: 9 mg/dL (ref 8.9–10.3)
Chloride: 98 mmol/L (ref 98–111)
Creatinine, Ser: 1.06 mg/dL — ABNORMAL HIGH (ref 0.44–1.00)
GFR, Estimated: >60 mL/min (ref >60)
Glucose, Bld: 112 mg/dL — ABNORMAL HIGH (ref 70–99)
Potassium: 3.7 mmol/L (ref 3.5–5.1)
Sodium: 134 mmol/L — ABNORMAL LOW (ref 135–145)

## 2020-12-25 MED ORDER — AMLODIPINE BESYLATE 5 MG PO TABS
5.0000 mg | ORAL_TABLET | Freq: Every day | ORAL | Status: DC
  Administered 2020-12-25 – 2020-12-26 (×2): 5 mg via ORAL
  Filled 2020-12-25 (×3): qty 1

## 2020-12-25 NOTE — Progress Notes (Signed)
Progress Note    Jane Estrada  QBH:[id number] DOB: 04/11/1976  DOA: 12/20/2020 PCP: Marliss Coots, NP    Brief Narrative:     Medical records reviewed and are as summarized below:  Jane Estrada is an 45 y.o. female with medical history significant of CAD; HIV; HTN; bipolar; granular cell tumor of the trachea; polysubstance abuse; homelessness; and chronic systolic CHF presenting with chest pain.  She reports that she has "been going through it."  Not able to breathe, CHF acting up, fluid on her lungs.  She had an esophageal infection in September and it recurred and that was the beginning of it all.  She came to the hospital with similar symptoms - and she wasn't treated.  2 months later on 12/7 she came back and told her she an esophageal infection.    Assessment/Plan:   Principal Problem:   HIV (human immunodeficiency virus infection) ([place]) Active Problems:   Chest pain   Tobacco dependence   Hypertension   Bipolar affective ([place])   Polysubstance abuse ([place])   Pancytopenia (HCC)   Shortness of breath   Thrush   Need for pneumocystis prophylaxis   Heart failure (HCC)   Acute on chronic combined CHF -Patient with known h/o chronic combined CHF (echo in 03/2020 with EF 45-50% and grade 1 diastolic dysfunction) presenting with worsening SOB without hypoxia -CXR consistent with mild pulmonary edema -Elevated BNP - ASA -Was given Lasix 60 mg x 1 in ER and and 40 mg IV BID-- will stop due to orthostatics and syncopal event -daily labs -echo: Left ventricular ejection fraction, by estimation, is 20%. The left ventricle has severely decreased function. The left ventricle demonstrates global hypokinesis. The left ventricular internal cavity size was severely dilated. There is mild left ventricular hypertrophy. Left ventricular diastolic parameters are consistent with Grade II diastolic dysfunction (pseudonormalization). Elevated left ventricular end-diastolic pressure.   -heart failure support team consult -started entresto -d/c lasix  Syncopal event -BP low: SBP <80 -d/c lasix -recheck in AM -tele showed bradycardia around 50 no pauses  Untreated HIV -CD4 <35, viral load pending -ID consult appreciated:   Will arrange follow-up in our clinic with me on 01/04/21 at 315pm  Continue fluconazole for 14 days  Patient agreeable to PCP prophylaxis.  Atovaquone likely better choice given her pancytopenia and report of TMP-SMX "tearing up her stomach"   Oral thrush -Diflucan 200 mg oral daily x 14 days -She is likely a poor current candidate for EGD given untreated AIDS  HTN -Patient acknowledges not taking medication for this issue and is resistant to all medications at this time -She was previously prescribed Norvasc, Hydralazine, Lopressor - Coreg (low-dose) for now   Homelessness/Social issues -patient moved here about 1 year ago after her husband overdosed and died in [place] -She reports difficulty in finding housing but has not interest in a shelter -She would have significant resources available to her through the [facility] and should be encouraged to seek those out -Has primary psychiatric disease vs. Substance-induced mental health disease; psych was consulted in 03/2020 and offered outpatient resources.   -She does not appear to have active SI/HI and inpatient psych consult is likely to provide outpatient referrals again  Polysubstance abuse -UDS + for marijuana and amphetamines -Also with h/o cocaine use in the past  Tobacco dependence -Encourage cessation.  -Patch ordered  pancytopenia -due to HIV  Hypokalemia -replete  Family Communication/Anticipated D/C date and plan/Code Status   DVT prophylaxis: Lovenox  ordered. Code Status: dnr Disposition Plan: Status is: Inpatient  Remains inpatient appropriate because:Inpatient level of care appropriate due to severity of illness   Dispo: The patient is from: Home               Anticipated d/c is to: Home              Anticipated d/c date is:1- 2 days              Patient currently is not medically stable to d/c.         Medical Consultants:    ID  chf team    Subjective:   Feels tired  Objective:    Vitals:   12/24/20 1555 12/24/20 2320 12/25/20 0427 12/25/20 1216  BP: 99/66 (!) 124/93 (!) 139/97 (!) 132/93  Pulse: 72 75 69 67  Resp:  18 16 16   Temp:  97.8 F (36.6 C) 97.9 F (36.6 C) 98 F (36.7 C)  TempSrc:  Oral Oral Oral  SpO2:  100% 100% 100%  Weight:   45.4 kg   Height:        Intake/Output Summary (Last 24 hours) at 12/25/2020 1312 Last data filed at 12/25/2020 0900 Gross per 24 hour  Intake 582 ml  Output 250 ml  Net 332 ml   Filed Weights   12/23/20 0500 12/24/20 0407 12/25/20 0427  Weight: 49.3 kg 49.6 kg 45.4 kg    Exam:  General: Appearance:    Thin female in no acute distress     Lungs:     respirations unlabored  Heart:    Normal heart rate. Normal rhythm. No murmurs, rubs, or gallops.   MS:   All extremities are intact.   Neurologic:   Awake, alert, oriented x 3. No apparent focal neurological           defect.      Data Reviewed:   I have personally reviewed following labs and imaging studies:  Labs: Labs show the following:   Basic Metabolic Panel: Recent Labs  Lab 12/20/20 2331 12/22/20 0006 12/23/20 0325 12/24/20 0356 12/25/20 0033  NA 135 132* 134* 136 134*  K 3.9 3.7 3.4* 4.2 3.7  CL 102 97* 99 100 98  CO2 23 26 24 25 25   GLUCOSE 102* 104* 102* 110* 112*  BUN 18 22* 25* 24* 26*  CREATININE 1.01* 1.24* 1.15* 1.20* 1.06*  CALCIUM 9.1 8.8* 8.7* 8.7* 9.0   GFR Estimated Creatinine Clearance: 49 mL/min (A) (by C-G formula based on SCr of 1.06 mg/dL (H)). Liver Function Tests: Recent Labs  Lab 12/21/20 0909  AST 30  ALT 14  ALKPHOS 42  BILITOT 1.0  PROT 7.4  ALBUMIN 3.6   No results for input(s): LIPASE, AMYLASE in the last 168 hours. No results for input(s): AMMONIA  in the last 168 hours. Coagulation profile No results for input(s): INR, PROTIME in the last 168 hours.  CBC: Recent Labs  Lab 12/20/20 2331 12/21/20 0909 12/22/20 0006 12/25/20 0033  WBC 1.2* 1.5* 1.1* 1.5*  NEUTROABS  --  1.0* 0.7*  --   HGB 9.0* 9.5* 9.6* 10.4*  HCT 29.9* 31.9* 29.6* 34.8*  MCV 91.4 91.4 87.6 90.2  PLT 110* 109* 110* 117*   Cardiac Enzymes: No results for input(s): CKTOTAL, CKMB, CKMBINDEX, TROPONINI in the last 168 hours. BNP (last 3 results) No results for input(s): PROBNP in the last 8760 hours. CBG: No results for input(s): GLUCAP in the last 168 hours. D-Dimer:  No results for input(s): DDIMER in the last 72 hours. Hgb A1c: No results for input(s): HGBA1C in the last 72 hours. Lipid Profile: No results for input(s): CHOL, HDL, LDLCALC, TRIG, CHOLHDL, LDLDIRECT in the last 72 hours. Thyroid function studies: No results for input(s): TSH, T4TOTAL, T3FREE, THYROIDAB in the last 72 hours.  Invalid input(s): FREET3 Anemia work up: No results for input(s): VITAMINB12, FOLATE, FERRITIN, TIBC, IRON, RETICCTPCT in the last 72 hours. Sepsis Labs: Recent Labs  Lab 12/20/20 2331 12/21/20 0909 12/22/20 0006 12/25/20 0033  WBC 1.2* 1.5* 1.1* 1.5*    Microbiology Recent Results (from the past 240 hour(s))  Resp Panel by RT-PCR (Flu A&B, Covid) Nasopharyngeal Swab     Status: None   Collection Time: 12/21/20 12:32 PM   Specimen: Nasopharyngeal Swab; Nasopharyngeal(NP) swabs in vial transport medium  Result Value Ref Range Status   SARS Coronavirus 2 by RT PCR NEGATIVE NEGATIVE Final    Comment: (NOTE) SARS-CoV-2 target nucleic acids are NOT DETECTED.  The SARS-CoV-2 RNA is generally detectable in upper respiratory specimens during the acute phase of infection. The lowest concentration of SARS-CoV-2 viral copies this assay can detect is 138 copies/mL. A negative result does not preclude SARS-Cov-2 infection and should not be used as the sole basis  for treatment or other patient management decisions. A negative result may occur with  improper specimen collection/handling, submission of specimen other than nasopharyngeal swab, presence of viral mutation(s) within the areas targeted by this assay, and inadequate number of viral copies(<138 copies/mL). A negative result must be combined with clinical observations, patient history, and epidemiological information. The expected result is Negative.  Fact Sheet for Patients:  EntrepreneurPulse.com.au  Fact Sheet for Healthcare Providers:  IncredibleEmployment.be  This test is no t yet approved or cleared by the [place] FDA and  has been authorized for detection and/or diagnosis of SARS-CoV-2 by FDA under an Emergency Use Authorization (EUA). This EUA will remain  in effect (meaning this test can be used) for the duration of the COVID-19 declaration under Section 564(b)(1) of the Act, 21 U.S.C.section 360bbb-3(b)(1), unless the authorization is terminated  or revoked sooner.       Influenza A by PCR NEGATIVE NEGATIVE Final   Influenza B by PCR NEGATIVE NEGATIVE Final    Comment: (NOTE) The Xpert Xpress SARS-CoV-2/FLU/RSV plus assay is intended as an aid in the diagnosis of influenza from Nasopharyngeal swab specimens and should not be used as a sole basis for treatment. Nasal washings and aspirates are unacceptable for Xpert Xpress SARS-CoV-2/FLU/RSV testing.  Fact Sheet for Patients: EntrepreneurPulse.com.au  Fact Sheet for Healthcare Providers: IncredibleEmployment.be  This test is not yet approved or cleared by the [place] FDA and has been authorized for detection and/or diagnosis of SARS-CoV-2 by FDA under an Emergency Use Authorization (EUA). This EUA will remain in effect (meaning this test can be used) for the duration of the COVID-19 declaration under Section 564(b)(1) of the Act, 21  U.S.C. section 360bbb-3(b)(1), unless the authorization is terminated or revoked.  Performed at [facility], [place] [address], [place], [place] [address]     Procedures and diagnostic studies:  DG HIP UNILAT WITH PELVIS 2-3 VIEWS RIGHT  Result Date: 12/24/2020 CLINICAL DATA:  Right hip pain for 2 days. EXAM: DG HIP (WITH OR WITHOUT PELVIS) 2-3V RIGHT COMPARISON:  None. FINDINGS: There is no evidence of hip fracture or dislocation. There is no evidence of arthropathy or other focal bone abnormality. IMPRESSION: Negative. Electronically Signed  By: Misty Stanley M.D.   On: 12/24/2020 13:45    Medications:   . aspirin EC  81 mg Oral Daily  . atovaquone  1,500 mg Oral Q breakfast  . carvedilol  6.25 mg Oral BID WC  . fluconazole  200 mg Oral Daily  . nicotine  14 mg Transdermal Daily  . sacubitril-valsartan  1 tablet Oral BID  . sodium chloride flush  3 mL Intravenous Q12H   Continuous Infusions: . sodium chloride       LOS: 4 days   Geradine Girt  [facility]   How to contact the [facility] Attending or Consulting provider [place] or covering provider during after hours [place], for this patient?  1. Check the care team in [facility] and look for a) attending/consulting TRH provider listed and b) the [facility] team listed 2. Log into [email or web address] and use [place]'s universal password to access. If you do not have the password, please contact the hospital operator. 3. Locate the [facility] provider you are looking for under [facility] and page to a number that you can be directly reached. 4. If you still have difficulty reaching the provider, please page the [facility] (Director on Call) for the Hospitalists listed on amion for assistance.  12/25/2020, 1:12 PM

## 2020-12-26 DIAGNOSIS — I509 Heart failure, unspecified: Secondary | ICD-10-CM | POA: Diagnosis not present

## 2020-12-26 DIAGNOSIS — F3111 Bipolar disorder, current episode manic without psychotic features, mild: Secondary | ICD-10-CM | POA: Diagnosis not present

## 2020-12-26 DIAGNOSIS — B20 Human immunodeficiency virus [HIV] disease: Secondary | ICD-10-CM | POA: Diagnosis not present

## 2020-12-26 NOTE — Progress Notes (Signed)
Pt asleep rr unlabored skin w/d

## 2020-12-26 NOTE — Progress Notes (Signed)
Progress Note    Jane Estrada  WLN:[id number] DOB: Oct 10, 1976  DOA: 12/20/2020 PCP: Marliss Coots, NP    Brief Narrative:     Medical records reviewed and are as summarized below:  Jane Estrada is an 45 y.o. female with medical history significant of CAD; HIV; HTN; bipolar; granular cell tumor of the trachea; polysubstance abuse; homelessness; and chronic systolic CHF presenting with chest pain.  She reports that she has "been going through it."  Not able to breathe, CHF acting up, fluid on her lungs.  She had an esophageal infection in September and it recurred and that was the beginning of it all.  She came to the hospital with similar symptoms - and she wasn't treated.  2 months later on 12/7 she came back and told her she an esophageal infection.    Assessment/Plan:   Principal Problem:   HIV (human immunodeficiency virus infection) ([place]) Active Problems:   Chest pain   Tobacco dependence   Hypertension   Bipolar affective ([place])   Polysubstance abuse ([place])   Pancytopenia (HCC)   Shortness of breath   Thrush   Need for pneumocystis prophylaxis   Heart failure (HCC)   Acute on chronic combined CHF -Patient with known h/o chronic combined CHF (echo in 03/2020 with EF 45-50% and grade 1 diastolic dysfunction) presenting with worsening SOB without hypoxia -CXR consistent with mild pulmonary edema -Elevated BNP - ASA -Was given Lasix 60 mg x 1 in ER and and 40 mg IV BID-- will stop due to orthostatics and syncopal event -daily labs -echo: Left ventricular ejection fraction, by estimation, is 20%. The left ventricle has severely decreased function. The left ventricle demonstrates global hypokinesis. The left ventricular internal cavity size was severely dilated. There is mild left ventricular hypertrophy. Left ventricular diastolic parameters are consistent with Grade II diastolic dysfunction (pseudonormalization). Elevated left ventricular end-diastolic pressure.   -heart failure support team consult -started entresto- monitor symptoms -d/c lasix  Syncopal event -BP low: SBP <80 -d/c lasix -recheck in AM -tele showed bradycardia around 50 no pauses  Untreated HIV -CD4 <35, viral load pending -ID consult appreciated:   Will arrange follow-up in our clinic with me on 01/04/21 at 315pm  Continue fluconazole for 14 days  Patient agreeable to PCP prophylaxis.  Atovaquone likely better choice given her pancytopenia and report of TMP-SMX "tearing up her stomach"   Oral thrush -Diflucan 200 mg oral daily x 14 days -She is likely a poor current candidate for EGD given untreated AIDS  HTN -Patient acknowledges not taking medication for this issue and is resistant to all medications at this time -She was previously prescribed Norvasc, Hydralazine, Lopressor - Coreg (low-dose) for now   Homelessness/Social issues -patient moved here about 1 year ago after her husband overdosed and died in [place] -She reports difficulty in finding housing but has not interest in a shelter -She would have significant resources available to her through the [facility] and should be encouraged to seek those out -Has primary psychiatric disease vs. Substance-induced mental health disease; psych was consulted in 03/2020 and offered outpatient resources.   -She does not appear to have active SI/HI and inpatient psych consult is likely to provide outpatient referrals again  Polysubstance abuse -UDS + for marijuana and amphetamines -Also with h/o cocaine use in the past  Tobacco dependence -Encourage cessation.  -Patch ordered  pancytopenia -due to HIV  Hypokalemia -replete  Family Communication/Anticipated D/C date and plan/Code Status   DVT  prophylaxis: Lovenox ordered. Code Status: dnr Disposition Plan: Status is: Inpatient  Remains inpatient appropriate because:Inpatient level of care appropriate due to severity of illness   Dispo: The patient  is from: Home              Anticipated d/c is to: Home              Anticipated d/c date is:1- 2 days              Patient currently is not medically stable to d/c.         Medical Consultants:    ID  chf team    Subjective:   No complaints   Objective:    Vitals:   12/25/20 1216 12/25/20 1723 12/25/20 2318 12/26/20 0500  BP: (!) 132/93 115/89 106/76 (!) 109/93  Pulse: 67 70 68 72  Resp: 16 18 18 16   Temp: 98 F (36.7 C) 98 F (36.7 C) 98.7 F (37.1 C) 98.6 F (37 C)  TempSrc: Oral Oral Oral Oral  SpO2: 100% 100% 100% 100%  Weight:    45.4 kg  Height:        Intake/Output Summary (Last 24 hours) at 12/26/2020 1115 Last data filed at 12/25/2020 1831 Gross per 24 hour  Intake -  Output 250 ml  Net -250 ml   Filed Weights   12/24/20 0407 12/25/20 0427 12/26/20 0500  Weight: 49.6 kg 45.4 kg 45.4 kg    Exam:  General: Appearance:    Thin female in no acute distress     Lungs:     respirations unlabored  Heart:    Normal heart rate. Normal rhythm. No murmurs, rubs, or gallops.   MS:   All extremities are intact.   Neurologic:   Poor eye contact     Data Reviewed:   I have personally reviewed following labs and imaging studies:  Labs: Labs show the following:   Basic Metabolic Panel: Recent Labs  Lab 12/20/20 2331 12/22/20 0006 12/23/20 0325 12/24/20 0356 12/25/20 0033  NA 135 132* 134* 136 134*  K 3.9 3.7 3.4* 4.2 3.7  CL 102 97* 99 100 98  CO2 23 26 24 25 25   GLUCOSE 102* 104* 102* 110* 112*  BUN 18 22* 25* 24* 26*  CREATININE 1.01* 1.24* 1.15* 1.20* 1.06*  CALCIUM 9.1 8.8* 8.7* 8.7* 9.0   GFR Estimated Creatinine Clearance: 49 mL/min (A) (by C-G formula based on SCr of 1.06 mg/dL (H)). Liver Function Tests: Recent Labs  Lab 12/21/20 0909  AST 30  ALT 14  ALKPHOS 42  BILITOT 1.0  PROT 7.4  ALBUMIN 3.6   No results for input(s): LIPASE, AMYLASE in the last 168 hours. No results for input(s): AMMONIA in the last 168  hours. Coagulation profile No results for input(s): INR, PROTIME in the last 168 hours.  CBC: Recent Labs  Lab 12/20/20 2331 12/21/20 0909 12/22/20 0006 12/25/20 0033  WBC 1.2* 1.5* 1.1* 1.5*  NEUTROABS  --  1.0* 0.7*  --   HGB 9.0* 9.5* 9.6* 10.4*  HCT 29.9* 31.9* 29.6* 34.8*  MCV 91.4 91.4 87.6 90.2  PLT 110* 109* 110* 117*   Cardiac Enzymes: No results for input(s): CKTOTAL, CKMB, CKMBINDEX, TROPONINI in the last 168 hours. BNP (last 3 results) No results for input(s): PROBNP in the last 8760 hours. CBG: No results for input(s): GLUCAP in the last 168 hours. D-Dimer: No results for input(s): DDIMER in the last 72 hours. Hgb A1c: No results  for input(s): HGBA1C in the last 72 hours. Lipid Profile: No results for input(s): CHOL, HDL, LDLCALC, TRIG, CHOLHDL, LDLDIRECT in the last 72 hours. Thyroid function studies: No results for input(s): TSH, T4TOTAL, T3FREE, THYROIDAB in the last 72 hours.  Invalid input(s): FREET3 Anemia work up: No results for input(s): VITAMINB12, FOLATE, FERRITIN, TIBC, IRON, RETICCTPCT in the last 72 hours. Sepsis Labs: Recent Labs  Lab 12/20/20 2331 12/21/20 0909 12/22/20 0006 12/25/20 0033  WBC 1.2* 1.5* 1.1* 1.5*    Microbiology Recent Results (from the past 240 hour(s))  Resp Panel by RT-PCR (Flu A&B, Covid) Nasopharyngeal Swab     Status: None   Collection Time: 12/21/20 12:32 PM   Specimen: Nasopharyngeal Swab; Nasopharyngeal(NP) swabs in vial transport medium  Result Value Ref Range Status   SARS Coronavirus 2 by RT PCR NEGATIVE NEGATIVE Final    Comment: (NOTE) SARS-CoV-2 target nucleic acids are NOT DETECTED.  The SARS-CoV-2 RNA is generally detectable in upper respiratory specimens during the acute phase of infection. The lowest concentration of SARS-CoV-2 viral copies this assay can detect is 138 copies/mL. A negative result does not preclude SARS-Cov-2 infection and should not be used as the sole basis for treatment  or other patient management decisions. A negative result may occur with  improper specimen collection/handling, submission of specimen other than nasopharyngeal swab, presence of viral mutation(s) within the areas targeted by this assay, and inadequate number of viral copies(<138 copies/mL). A negative result must be combined with clinical observations, patient history, and epidemiological information. The expected result is Negative.  Fact Sheet for Patients:  BloggerCourse.comhttps://www.fda.gov/media/152166/download  Fact Sheet for Healthcare Providers:  SeriousBroker.ithttps://www.fda.gov/media/152162/download  This test is no t yet approved or cleared by the [place] FDA and  has been authorized for detection and/or diagnosis of SARS-CoV-2 by FDA under an Emergency Use Authorization (EUA). This EUA will remain  in effect (meaning this test can be used) for the duration of the COVID-19 declaration under Section 564(b)(1) of the Act, 21 U.S.C.section 360bbb-3(b)(1), unless the authorization is terminated  or revoked sooner.       Influenza A by PCR NEGATIVE NEGATIVE Final   Influenza B by PCR NEGATIVE NEGATIVE Final    Comment: (NOTE) The Xpert Xpress SARS-CoV-2/FLU/RSV plus assay is intended as an aid in the diagnosis of influenza from Nasopharyngeal swab specimens and should not be used as a sole basis for treatment. Nasal washings and aspirates are unacceptable for Xpert Xpress SARS-CoV-2/FLU/RSV testing.  Fact Sheet for Patients: BloggerCourse.comhttps://www.fda.gov/media/152166/download  Fact Sheet for Healthcare Providers: SeriousBroker.ithttps://www.fda.gov/media/152162/download  This test is not yet approved or cleared by the [place] FDA and has been authorized for detection and/or diagnosis of SARS-CoV-2 by FDA under an Emergency Use Authorization (EUA). This EUA will remain in effect (meaning this test can be used) for the duration of the COVID-19 declaration under Section 564(b)(1) of the Act, 21 U.S.C. section  360bbb-3(b)(1), unless the authorization is terminated or revoked.  Performed at [facility], [address] [place], [place] [address]     Procedures and diagnostic studies:  DG HIP UNILAT WITH PELVIS 2-3 VIEWS RIGHT  Result Date: 12/24/2020 CLINICAL DATA:  Right hip pain for 2 days. EXAM: DG HIP (WITH OR WITHOUT PELVIS) 2-3V RIGHT COMPARISON:  None. FINDINGS: There is no evidence of hip fracture or dislocation. There is no evidence of arthropathy or other focal bone abnormality. IMPRESSION: Negative. Electronically Signed   By: Kennith CenterEric  Mansell M.D.   On: 12/24/2020 13:45  Medications:   . amLODipine  5 mg Oral Daily  . aspirin EC  81 mg Oral Daily  . atovaquone  1,500 mg Oral Q breakfast  . carvedilol  6.25 mg Oral BID WC  . fluconazole  200 mg Oral Daily  . nicotine  14 mg Transdermal Daily  . sacubitril-valsartan  1 tablet Oral BID  . sodium chloride flush  3 mL Intravenous Q12H   Continuous Infusions: . sodium chloride       LOS: 5 days   Geradine Girt  [facility]   How to contact the [facility] Attending or Consulting provider [place] or covering provider during after hours [place], for this patient?  1. Check the care team in [facility] and look for a) attending/consulting TRH provider listed and b) the [facility] team listed 2. Log into [email or web address] and use 's universal password to access. If you do not have the password, please contact the hospital operator. 3. Locate the [facility] provider you are looking for under [facility] and page to a number that you can be directly reached. 4. If you still have difficulty reaching the provider, please page the [facility] (Director on Call) for the Hospitalists listed on amion for assistance.  12/26/2020, 11:15 AM

## 2020-12-26 NOTE — Progress Notes (Signed)
Pt asleep rr unlabored skin w/d 

## 2020-12-27 DIAGNOSIS — I509 Heart failure, unspecified: Secondary | ICD-10-CM | POA: Diagnosis not present

## 2020-12-27 DIAGNOSIS — B20 Human immunodeficiency virus [HIV] disease: Secondary | ICD-10-CM | POA: Diagnosis not present

## 2020-12-27 DIAGNOSIS — F3111 Bipolar disorder, current episode manic without psychotic features, mild: Secondary | ICD-10-CM | POA: Diagnosis not present

## 2020-12-27 LAB — CBC
HCT: 32.2 % — ABNORMAL LOW (ref 36.0–46.0)
Hemoglobin: 9.9 g/dL — ABNORMAL LOW (ref 12.0–15.0)
MCH: 27.5 pg (ref 26.0–34.0)
MCHC: 30.7 g/dL (ref 30.0–36.0)
MCV: 89.4 fL (ref 80.0–100.0)
Platelets: 110 10*3/uL — ABNORMAL LOW (ref 150–400)
RBC: 3.6 MIL/uL — ABNORMAL LOW (ref 3.87–5.11)
RDW: 15.2 % (ref 11.5–15.5)
WBC: 1.1 10*3/uL — CL (ref 4.0–10.5)
nRBC: 0 % (ref 0.0–0.2)

## 2020-12-27 LAB — BASIC METABOLIC PANEL
Anion gap: 9 (ref 5–15)
BUN: 23 mg/dL — ABNORMAL HIGH (ref 6–20)
CO2: 25 mmol/L (ref 22–32)
Calcium: 9.1 mg/dL (ref 8.9–10.3)
Chloride: 102 mmol/L (ref 98–111)
Creatinine, Ser: 1.12 mg/dL — ABNORMAL HIGH (ref 0.44–1.00)
GFR, Estimated: >60 mL/min (ref >60)
Glucose, Bld: 104 mg/dL — ABNORMAL HIGH (ref 70–99)
Potassium: 4.1 mmol/L (ref 3.5–5.1)
Sodium: 136 mmol/L (ref 135–145)

## 2020-12-27 MED ORDER — SALINE SPRAY 0.65 % NA SOLN
1.0000 | NASAL | Status: DC | PRN
  Administered 2020-12-27 – 2020-12-28 (×2): 1 via NASAL
  Filled 2020-12-27: qty 44

## 2020-12-27 MED ORDER — SENNA 8.6 MG PO TABS
1.0000 | ORAL_TABLET | Freq: Every day | ORAL | Status: DC | PRN
  Administered 2020-12-27: 8.6 mg via ORAL
  Filled 2020-12-27: qty 1

## 2020-12-27 MED ORDER — ACETAMINOPHEN 325 MG PO TABS
650.0000 mg | ORAL_TABLET | Freq: Four times a day (QID) | ORAL | Status: DC | PRN
  Administered 2020-12-27: 650 mg via ORAL
  Filled 2020-12-27: qty 2

## 2020-12-27 MED ORDER — AMLODIPINE BESYLATE 10 MG PO TABS
10.0000 mg | ORAL_TABLET | Freq: Every day | ORAL | Status: DC
  Filled 2020-12-27: qty 1

## 2020-12-27 MED ORDER — DOCUSATE SODIUM 100 MG PO CAPS
100.0000 mg | ORAL_CAPSULE | Freq: Two times a day (BID) | ORAL | Status: DC
  Administered 2020-12-27 – 2020-12-29 (×4): 100 mg via ORAL
  Filled 2020-12-27 (×4): qty 1

## 2020-12-27 MED ORDER — BISACODYL 10 MG RE SUPP
10.0000 mg | Freq: Every day | RECTAL | Status: DC | PRN
  Administered 2020-12-28: 10 mg via RECTAL
  Filled 2020-12-27: qty 1

## 2020-12-27 NOTE — Progress Notes (Signed)
Heart Failure Stewardship Pharmacist Progress Note   PCP: Placey, Audrea Muscat, NP PCP-Cardiologist: No primary care provider on file.    HPI:  45 YO female with PMH of CAD, CHF, HIV, HTN, polysubstance abuse, and homelessness. Presented to ED with trouble breathing and chest pain. Patient has not been adherence to home medications. Description consistent with severe orthopnea. ECHO completed on 12/22/20 showing an EF of 20%.   Current HF Medications: Carvedilol 6.25 mg BID Entresto 24/26 mg BID  Prior to admission HF Medications: *Not taking these medications, but on PTA list Furosemide 20 mg daily x 5 days Metoprolol tartrate 12.5 mg BID  Hydralazine 25 mg TID  Pertinent Lab Values: . Serum creatinine 1.12, BUN 23, Potassium 4.1, Sodium 136, BNP 789  Vital Signs: . Weight: 108 lbs (admission weight: 143 lbs) . Blood pressure: 110-130/90s . Heart rate: 60s  Medication Assistance / Insurance Benefits Check: Does the patient have prescription insurance?  Yes Type of insurance plan: [place] Medicaid (working on transitioning to [facility])  Outpatient Pharmacy:  [facility] Is the patient willing to use [facility] at discharge? Yes Is the patient willing to transition their outpatient pharmacy to utilize a [facility] outpatient pharmacy?   Pending    Assessment: 1. Acute on chronic systolic CHF (EF 52%), with multifactoral etiolgy and medication noncompliance. NYHA class II symptoms. - Continue carvedilol 6.25 mg BID - Continue Entresto 24/26 mg BID - will work on pt assistance for her - Consider adding spironolactone +/- SGLT2i pending BP and SCr trends  2. Medication noncompliance - Patient expresses that she is homeless, without current [place] Medicaid (pending approval) and she refuses to go to a shelter.  - She is willing to start taking medications for HF but is refusing to take any HIV medications even after education on therapy. She  states that in the past she got sick after taking HIV meds and is no longer interested in discussing therapy options.  - [facility] and she has only picked up Bactrim and nystatin (all other meds were on hold) so she has been off chronic therapy for an unknown amount of time. - Per EPIC dispense record, she has last filled lasix on 09/06/20 x 30 day supply.   Plan: 1) Medication changes recommended at this time: - Add spironolactone 12.5 mg daily  2) Patient assistance application(s) / nonadherence: - Consulted HF RN navigator - Will need referral to [facility] to establish PCP and can utilize pharmacy financial resources pending [employer] - Will begin working on pt assistance for [employer] while Medicaid is pending - will need to establish an address that [employer] will be able to mail her prescriptions to.   3)  Education  - To be completed prior to discharge  Kerby Nora, PharmD, BCPS Heart Failure Stewardship Pharmacist Phone [phone]

## 2020-12-27 NOTE — TOC Progression Note (Signed)
Transition of Care [facility]) - Progression Note    Patient Details  Name: Jane Estrada MRN: [id number] Date of Birth: 19-Nov-1976  Transition of [facility]) CM/SW Contact  Jacalyn Lefevre Edson Snowball, RN Phone Number: 12/27/2020, 3:18 PM  Clinical Narrative:     Consent for [facility] pharmacy can provide 30 days free. NCM spoke with [facility] last week. Patient can be a walk in at discharge ( the close at 3 pm). [facility] will assist with Entresto , however [facility] [address] [facility] through her Medicaid of [place]. Patient states she has completed paperwork for [place] [employer]   Expected Discharge Plan: Homeless Shelter    Expected Discharge Plan and Services Expected Discharge Plan: Homeless Shelter                         DME Arranged: N/A         HH Arranged: NA           Social Determinants of Health (SDOH) Interventions Food Insecurity Interventions: Other (Comment) (Pt states she has food stamps) Intimate Partner Violence Interventions: Intervention Not Indicated Social Connections Interventions: Patient Refused Transportation Interventions: Patient Refused (has [employer] card or utilizes friends for rides. Pt is experiencing homelessness.)  Readmission Risk Interventions No flowsheet data found.

## 2020-12-27 NOTE — Progress Notes (Signed)
Progress Note    Jane Estrada  NWG:[id number] DOB: Jul 02, 1976  DOA: 12/20/2020 PCP: Marliss Coots, NP    Brief Narrative:     Medical records reviewed and are as summarized below:  Jane Estrada is an 45 y.o. female with medical history significant of CAD; HIV; HTN; bipolar; granular cell tumor of the trachea; polysubstance abuse; homelessness; and chronic systolic CHF presenting with chest pain.  She reports that she has "been going through it."  Not able to breathe, CHF acting up, fluid on her lungs.  She had an esophageal infection in September and it recurred and that was the beginning of it all.  She came to the hospital with similar symptoms - and she wasn't treated.  2 months later on 12/7 she came back and told her she an esophageal infection.    Assessment/Plan:   Principal Problem:   HIV (human immunodeficiency virus infection) ([place]) Active Problems:   Chest pain   Tobacco dependence   Hypertension   Bipolar affective ([place])   Polysubstance abuse ([place])   Pancytopenia (HCC)   Shortness of breath   Thrush   Need for pneumocystis prophylaxis   Heart failure (HCC)   Acute on chronic combined CHF -Patient with known h/o chronic combined CHF (echo in 03/2020 with EF 45-50% and grade 1 diastolic dysfunction) presenting with worsening SOB without hypoxia -CXR consistent with mild pulmonary edema -Elevated BNP - ASA -Was given Lasix 60 mg x 1 in ER and and 40 mg IV BID-- will stop due to orthostatics and syncopal event -daily labs -echo: Left ventricular ejection fraction, by estimation, is 20%. The left ventricle has severely decreased function. The left ventricle demonstrates global hypokinesis. The left ventricular internal cavity size was severely dilated. There is mild left ventricular hypertrophy. Left ventricular diastolic parameters are consistent with Grade II diastolic dysfunction (pseudonormalization). Elevated left ventricular end-diastolic pressure.   -heart failure support team consult -started entresto- monitor symptoms -d/c lasix  Syncopal event -BP low: SBP <80 -d/c lasix -recheck in AM -tele showed bradycardia around 50 no pauses  Untreated HIV -CD4 <35, viral load pending -ID consult appreciated:   Will arrange follow-up in our clinic with me on 01/04/21 at 315pm  Continue fluconazole for 14 days  Patient agreeable to PCP prophylaxis.  Atovaquone likely better choice given her pancytopenia and report of TMP-SMX "tearing up her stomach"   Oral thrush -Diflucan 200 mg oral daily x 14 days -She is likely a poor current candidate for EGD given untreated AIDS  HTN -Patient acknowledges not taking medication for this issue and is resistant to all medications at this time -She was previously prescribed Norvasc, Hydralazine, Lopressor - Coreg (low-dose) for now   Homelessness/Social issues -patient moved here about 1 year ago after her husband overdosed and died in [place] -She reports difficulty in finding housing but has not interest in a shelter -She would have significant resources available to her through the [facility] and should be encouraged to seek those out -Has primary psychiatric disease vs. Substance-induced mental health disease; psych was consulted in 03/2020 and offered outpatient resources.   -She does not appear to have active SI/HI and inpatient psych consult is likely to provide outpatient referrals again  Polysubstance abuse -UDS + for marijuana and amphetamines -Also with h/o cocaine use in the past  Tobacco dependence -Encourage cessation.  -Patch ordered  pancytopenia -due to HIV  Hypokalemia -replete   Complicated by her homelessness   Family Communication/Anticipated D/C  date and plan/Code Status   DVT prophylaxis: Lovenox ordered. Code Status: dnr Disposition Plan: Status is: Inpatient  Remains inpatient appropriate because:Inpatient level of care appropriate due to  severity of illness   Dispo: The patient is from: Home              Anticipated d/c is to: Home              Anticipated d/c date is:in AM?              Patient currently is not medically stable to d/c.         Medical Consultants:    ID  chf team    Subjective:   Says she coughs at night  Objective:    Vitals:   12/26/20 2312 12/27/20 0336 12/27/20 0900 12/27/20 1229  BP: 111/90 (!) 126/93 (!) 137/98 112/62  Pulse: 65 68 68 61  Resp: 16 14 14 16   Temp: 98.3 F (36.8 C) 98.4 F (36.9 C)  98.1 F (36.7 C)  TempSrc: Oral Oral  Oral  SpO2: 100% 100%  100%  Weight:  49.2 kg    Height:        Intake/Output Summary (Last 24 hours) at 12/27/2020 1341 Last data filed at 12/27/2020 0939 Gross per 24 hour  Intake 243 ml  Output 1300 ml  Net -1057 ml   Filed Weights   12/25/20 0427 12/26/20 0500 12/27/20 0336  Weight: 45.4 kg 45.4 kg 49.2 kg    Exam:   General: Appearance:    Thin female in no acute distress     Lungs:     respirations unlabored  Heart:    Normal heart rate. Normal rhythm. No murmurs, rubs, or gallops.   MS:   All extremities are intact.   Neurologic:   Awake, alert, oriented x 3. No apparent focal neurological           defect.     Data Reviewed:   I have personally reviewed following labs and imaging studies:  Labs: Labs show the following:   Basic Metabolic Panel: Recent Labs  Lab 12/22/20 0006 12/23/20 0325 12/24/20 0356 12/25/20 0033 12/27/20 0330  NA 132* 134* 136 134* 136  K 3.7 3.4* 4.2 3.7 4.1  CL 97* 99 100 98 102  CO2 26 24 25 25 25   GLUCOSE 104* 102* 110* 112* 104*  BUN 22* 25* 24* 26* 23*  CREATININE 1.24* 1.15* 1.20* 1.06* 1.12*  CALCIUM 8.8* 8.7* 8.7* 9.0 9.1   GFR Estimated Creatinine Clearance: 50.3 mL/min (A) (by C-G formula based on SCr of 1.12 mg/dL (H)). Liver Function Tests: Recent Labs  Lab 12/21/20 0909  AST 30  ALT 14  ALKPHOS 42  BILITOT 1.0  PROT 7.4  ALBUMIN 3.6   No results for  input(s): LIPASE, AMYLASE in the last 168 hours. No results for input(s): AMMONIA in the last 168 hours. Coagulation profile No results for input(s): INR, PROTIME in the last 168 hours.  CBC: Recent Labs  Lab 12/20/20 2331 12/21/20 0909 12/22/20 0006 12/25/20 0033 12/27/20 0330  WBC 1.2* 1.5* 1.1* 1.5* 1.1*  NEUTROABS  --  1.0* 0.7*  --   --   HGB 9.0* 9.5* 9.6* 10.4* 9.9*  HCT 29.9* 31.9* 29.6* 34.8* 32.2*  MCV 91.4 91.4 87.6 90.2 89.4  PLT 110* 109* 110* 117* 110*   Cardiac Enzymes: No results for input(s): CKTOTAL, CKMB, CKMBINDEX, TROPONINI in the last 168 hours. BNP (last 3 results) No results  for input(s): PROBNP in the last 8760 hours. CBG: No results for input(s): GLUCAP in the last 168 hours. D-Dimer: No results for input(s): DDIMER in the last 72 hours. Hgb A1c: No results for input(s): HGBA1C in the last 72 hours. Lipid Profile: No results for input(s): CHOL, HDL, LDLCALC, TRIG, CHOLHDL, LDLDIRECT in the last 72 hours. Thyroid function studies: No results for input(s): TSH, T4TOTAL, T3FREE, THYROIDAB in the last 72 hours.  Invalid input(s): FREET3 Anemia work up: No results for input(s): VITAMINB12, FOLATE, FERRITIN, TIBC, IRON, RETICCTPCT in the last 72 hours. Sepsis Labs: Recent Labs  Lab 12/21/20 0909 12/22/20 0006 12/25/20 0033 12/27/20 0330  WBC 1.5* 1.1* 1.5* 1.1*    Microbiology Recent Results (from the past 240 hour(s))  Resp Panel by RT-PCR (Flu A&B, Covid) Nasopharyngeal Swab     Status: None   Collection Time: 12/21/20 12:32 PM   Specimen: Nasopharyngeal Swab; Nasopharyngeal(NP) swabs in vial transport medium  Result Value Ref Range Status   SARS Coronavirus 2 by RT PCR NEGATIVE NEGATIVE Final    Comment: (NOTE) SARS-CoV-2 target nucleic acids are NOT DETECTED.  The SARS-CoV-2 RNA is generally detectable in upper respiratory specimens during the acute phase of infection. The lowest concentration of SARS-CoV-2 viral copies this assay  can detect is 138 copies/mL. A negative result does not preclude SARS-Cov-2 infection and should not be used as the sole basis for treatment or other patient management decisions. A negative result may occur with  improper specimen collection/handling, submission of specimen other than nasopharyngeal swab, presence of viral mutation(s) within the areas targeted by this assay, and inadequate number of viral copies(<138 copies/mL). A negative result must be combined with clinical observations, patient history, and epidemiological information. The expected result is Negative.  Fact Sheet for Patients:  EntrepreneurPulse.com.au  Fact Sheet for Healthcare Providers:  IncredibleEmployment.be  This test is no t yet approved or cleared by the [place] FDA and  has been authorized for detection and/or diagnosis of SARS-CoV-2 by FDA under an Emergency Use Authorization (EUA). This EUA will remain  in effect (meaning this test can be used) for the duration of the COVID-19 declaration under Section 564(b)(1) of the Act, 21 U.S.C.section 360bbb-3(b)(1), unless the authorization is terminated  or revoked sooner.       Influenza A by PCR NEGATIVE NEGATIVE Final   Influenza B by PCR NEGATIVE NEGATIVE Final    Comment: (NOTE) The Xpert Xpress SARS-CoV-2/FLU/RSV plus assay is intended as an aid in the diagnosis of influenza from Nasopharyngeal swab specimens and should not be used as a sole basis for treatment. Nasal washings and aspirates are unacceptable for Xpert Xpress SARS-CoV-2/FLU/RSV testing.  Fact Sheet for Patients: EntrepreneurPulse.com.au  Fact Sheet for Healthcare Providers: IncredibleEmployment.be  This test is not yet approved or cleared by the [place] FDA and has been authorized for detection and/or diagnosis of SARS-CoV-2 by FDA under an Emergency Use Authorization (EUA). This EUA will  remain in effect (meaning this test can be used) for the duration of the COVID-19 declaration under Section 564(b)(1) of the Act, 21 U.S.C. section 360bbb-3(b)(1), unless the authorization is terminated or revoked.  Performed at [facility], [place] [address], [place], [place] [address]     Procedures and diagnostic studies:  No results found.  Medications:   . [START ON 12/28/2020] amLODipine  10 mg Oral Daily  . aspirin EC  81 mg Oral Daily  . atovaquone  1,500 mg Oral Q breakfast  . carvedilol  6.25 mg Oral BID WC  . fluconazole  200 mg Oral Daily  . nicotine  14 mg Transdermal Daily  . sacubitril-valsartan  1 tablet Oral BID  . sodium chloride flush  3 mL Intravenous Q12H   Continuous Infusions: . sodium chloride       LOS: 6 days   Geradine Girt  [facility]   How to contact the [facility] Attending or Consulting provider [place] or covering provider during after hours [place], for this patient?  1. Check the care team in [facility] and look for a) attending/consulting TR[facility] provider listed and b) the [facility] team listed 2. Log into [email or web address] and use [place]'s universal password to access. If you do not have the password, please contact the hospital operator. 3. Locate the [facility] provider you are looking for under [facility] and page to a number that you can be directly reached. 4. If you still have difficulty reaching the provider, please page the [facility] (Director on Call) for the Hospitalists listed on amion for assistance.  12/27/2020, 1:41 PM

## 2020-12-27 NOTE — Progress Notes (Signed)
CRITICAL VALUE ALERT  Critical Value WBC 1.1  Date & Time Notied: 12/27/20 4:04 am  Provider notified Dr Cyd Silence Orders Received/Actions taken:

## 2020-12-28 DIAGNOSIS — D61818 Other pancytopenia: Secondary | ICD-10-CM | POA: Diagnosis not present

## 2020-12-28 DIAGNOSIS — I509 Heart failure, unspecified: Secondary | ICD-10-CM | POA: Diagnosis not present

## 2020-12-28 DIAGNOSIS — B20 Human immunodeficiency virus [HIV] disease: Secondary | ICD-10-CM | POA: Diagnosis not present

## 2020-12-28 MED ORDER — SPIRONOLACTONE 12.5 MG HALF TABLET
12.5000 mg | ORAL_TABLET | Freq: Every day | ORAL | Status: DC
Start: 2020-12-28 — End: 2020-12-29
  Administered 2020-12-28 – 2020-12-29 (×2): 12.5 mg via ORAL
  Filled 2020-12-28 (×2): qty 1

## 2020-12-28 NOTE — Progress Notes (Signed)
When entered pt room noted pt was crying. I asked pt wht was she crying pt stated was tired. I asked what was pt tired of, pt stated felt tired of being sick and taking medication and feels not getting any better. Pt also stated she felt that all the people she truly loved and loved her back are gone. Pt referred to husband and best friend. Pt stated she felt guilty that her husband OD and pt was right there with him and didn't do anything she felt like a waste of the air she breathes. Discussed with pt not blaming herself and multiple times of trying to tell husband to go to rehab and quit drugs also  importance of forgiving herself reassurance given. Pt denies any suicidal thought or plans, attempt to doWill continue to monitor pt.

## 2020-12-28 NOTE — Progress Notes (Signed)
Progress Note    Jane Estrada  YQM:[id number] DOB: 28-Apr-1976  DOA: 12/20/2020 PCP: Marliss Coots, NP    Brief Narrative:     Medical records reviewed and are as summarized below:  Jane Estrada is an 45 y.o. female with medical history significant of CAD; HIV; HTN; bipolar; granular cell tumor of the trachea; polysubstance abuse; homelessness; and chronic systolic CHF presenting with chest pain.  She reports that she has "been going through it."  Not able to breathe, CHF acting up, fluid on her lungs.  She had an esophageal infection in September and it recurred and that was the beginning of it all.  She came to the hospital with similar symptoms - and she wasn't treated.  2 months later on 12/7 she came back and told her she an esophageal infection. Patient's situation complicated by homelessness.     Assessment/Plan:   Principal Problem:   HIV (human immunodeficiency virus infection) ([place]) Active Problems:   Chest pain   Tobacco dependence   Hypertension   Bipolar affective ([place])   Polysubstance abuse ([place])   Pancytopenia (HCC)   Shortness of breath   Thrush   Need for pneumocystis prophylaxis   Heart failure (HCC)   Acute on chronic combined CHF -Patient with known h/o chronic combined CHF (echo in 03/2020 with EF 45-50% and grade 1 diastolic dysfunction) presenting with worsening SOB without hypoxia -CXR consistent with mild pulmonary edema/Elevated BNP -Was given Lasix 60 mg x 1 in ER and and 40 mg IV BID-- will stop due to orthostatics and syncopal event -echo: Left ventricular ejection fraction, by estimation, is 20%. The left ventricle has severely decreased function. The left ventricle demonstrates global hypokinesis. The left ventricular internal cavity size was severely dilated. There is mild left ventricular hypertrophy. Left ventricular diastolic parameters are consistent with Grade II diastolic dysfunction (pseudonormalization). Elevated left ventricular  end-diastolic pressure.  -heart failure support team consult -started entresto- monitor symptoms -added aldactone -d/c in AM if K+ stable  Syncopal event -BP low: SBP <80 -d/c lasix -no further events  Untreated HIV -CD4 <35, viral load pending -ID consult appreciated:   Will arrange follow-up in our clinic with me on 01/04/21 at 315pm  Continue fluconazole for 14 days  Patient agreeable to PCP prophylaxis.  Atovaquone likely better choice given her pancytopenia and report of TMP-SMX "tearing up her stomach"  Oral thrush -Diflucan 200 mg oral daily x 14 days  HTN -Patient acknowledges not taking medication for this issue and is resistant to all medications at this time -She was previously prescribed Norvasc, Hydralazine, Lopressor - Coreg (low-dose) for now plus entresto plus aldactone (added 1/18)   Homelessness/Social issues -patient moved here about 1 year ago after her husband overdosed and died in [place] -She reports difficulty in finding housing but has not interest in a shelter -She would have significant resources available to her through the [facility] and should be encouraged to seek those out -Has primary psychiatric disease vs. Substance-induced mental health disease; psych was consulted in 03/2020 and offered outpatient resources.   -She does not appear to have active SI/HI and inpatient psych consult is likely to provide outpatient referrals again  Polysubstance abuse -UDS + for marijuana and amphetamines -Also with h/o cocaine use in the past  Tobacco dependence -Encourage cessation.  -Patch ordered  pancytopenia -due to HIV  Hypokalemia -repleted     Family Communication/Anticipated D/C date and plan/Code Status   DVT prophylaxis: scd Code Status:  dnr Disposition Plan: Status is: Inpatient  Remains inpatient appropriate because:Inpatient level of care appropriate due to severity of illness   Dispo: The patient is from: Home               Anticipated d/c is to: Home              Anticipated d/c date is:in AM?              Patient currently is not medically stable to d/c.BMP in AM- d/c if no need for O2 and K stable         Medical Consultants:    ID  chf team    Subjective:   C/o being tired and occasional dizziness  Objective:    Vitals:   12/27/20 1755 12/27/20 2200 12/28/20 0400 12/28/20 1206  BP: 110/84 113/75 (!) 121/91 129/86  Pulse: 65 60 64 66  Resp: 18 18 18 18   Temp: 97.8 F (36.6 C) 98.1 F (36.7 C) 98.6 F (37 C) 98.4 F (36.9 C)  TempSrc: Oral Oral Oral Oral  SpO2: 100% 100% 100% 100%  Weight:      Height:        Intake/Output Summary (Last 24 hours) at 12/28/2020 1313 Last data filed at 12/28/2020 1200 Gross per 24 hour  Intake 500 ml  Output 800 ml  Net -300 ml   Filed Weights   12/25/20 0427 12/26/20 0500 12/27/20 0336  Weight: 45.4 kg 45.4 kg 49.2 kg    Exam:  General: Appearance:    Thin female in no acute distress     Lungs:     respirations unlabored, not on O2, laying flat  Heart:    Normal heart rate. Normal rhythm. No murmurs, rubs, or gallops.   MS:   All extremities are intact.   Neurologic:   Awake, alert, oriented x 3.     Data Reviewed:   I have personally reviewed following labs and imaging studies:  Labs: Labs show the following:   Basic Metabolic Panel: Recent Labs  Lab 12/22/20 0006 12/23/20 0325 12/24/20 0356 12/25/20 0033 12/27/20 0330  NA 132* 134* 136 134* 136  K 3.7 3.4* 4.2 3.7 4.1  CL 97* 99 100 98 102  CO2 26 24 25 25 25   GLUCOSE 104* 102* 110* 112* 104*  BUN 22* 25* 24* 26* 23*  CREATININE 1.24* 1.15* 1.20* 1.06* 1.12*  CALCIUM 8.8* 8.7* 8.7* 9.0 9.1   GFR Estimated Creatinine Clearance: 50.3 mL/min (A) (by C-G formula based on SCr of 1.12 mg/dL (H)). Liver Function Tests: No results for input(s): AST, ALT, ALKPHOS, BILITOT, PROT, ALBUMIN in the last 168 hours. No results for input(s): LIPASE, AMYLASE in the last 168  hours. No results for input(s): AMMONIA in the last 168 hours. Coagulation profile No results for input(s): INR, PROTIME in the last 168 hours.  CBC: Recent Labs  Lab 12/22/20 0006 12/25/20 0033 12/27/20 0330  WBC 1.1* 1.5* 1.1*  NEUTROABS 0.7*  --   --   HGB 9.6* 10.4* 9.9*  HCT 29.6* 34.8* 32.2*  MCV 87.6 90.2 89.4  PLT 110* 117* 110*   Cardiac Enzymes: No results for input(s): CKTOTAL, CKMB, CKMBINDEX, TROPONINI in the last 168 hours. BNP (last 3 results) No results for input(s): PROBNP in the last 8760 hours. CBG: No results for input(s): GLUCAP in the last 168 hours. D-Dimer: No results for input(s): DDIMER in the last 72 hours. Hgb A1c: No results for input(s): HGBA1C in the  last 72 hours. Lipid Profile: No results for input(s): CHOL, HDL, LDLCALC, TRIG, CHOLHDL, LDLDIRECT in the last 72 hours. Thyroid function studies: No results for input(s): TSH, T4TOTAL, T3FREE, THYROIDAB in the last 72 hours.  Invalid input(s): FREET3 Anemia work up: No results for input(s): VITAMINB12, FOLATE, FERRITIN, TIBC, IRON, RETICCTPCT in the last 72 hours. Sepsis Labs: Recent Labs  Lab 12/22/20 0006 12/25/20 0033 12/27/20 0330  WBC 1.1* 1.5* 1.1*    Microbiology Recent Results (from the past 240 hour(s))  Resp Panel by RT-PCR (Flu A&B, Covid) Nasopharyngeal Swab     Status: None   Collection Time: 12/21/20 12:32 PM   Specimen: Nasopharyngeal Swab; Nasopharyngeal(NP) swabs in vial transport medium  Result Value Ref Range Status   SARS Coronavirus 2 by RT PCR NEGATIVE NEGATIVE Final    Comment: (NOTE) SARS-CoV-2 target nucleic acids are NOT DETECTED.  The SARS-CoV-2 RNA is generally detectable in upper respiratory specimens during the acute phase of infection. The lowest concentration of SARS-CoV-2 viral copies this assay can detect is 138 copies/mL. A negative result does not preclude SARS-Cov-2 infection and should not be used as the sole basis for treatment or other  patient management decisions. A negative result may occur with  improper specimen collection/handling, submission of specimen other than nasopharyngeal swab, presence of viral mutation(s) within the areas targeted by this assay, and inadequate number of viral copies(<138 copies/mL). A negative result must be combined with clinical observations, patient history, and epidemiological information. The expected result is Negative.  Fact Sheet for Patients:  EntrepreneurPulse.com.au  Fact Sheet for Healthcare Providers:  IncredibleEmployment.be  This test is no t yet approved or cleared by the [place] FDA and  has been authorized for detection and/or diagnosis of SARS-CoV-2 by FDA under an Emergency Use Authorization (EUA). This EUA will remain  in effect (meaning this test can be used) for the duration of the COVID-19 declaration under Section 564(b)(1) of the Act, 21 U.S.C.section 360bbb-3(b)(1), unless the authorization is terminated  or revoked sooner.       Influenza A by PCR NEGATIVE NEGATIVE Final   Influenza B by PCR NEGATIVE NEGATIVE Final    Comment: (NOTE) The Xpert Xpress SARS-CoV-2/FLU/RSV plus assay is intended as an aid in the diagnosis of influenza from Nasopharyngeal swab specimens and should not be used as a sole basis for treatment. Nasal washings and aspirates are unacceptable for Xpert Xpress SARS-CoV-2/FLU/RSV testing.  Fact Sheet for Patients: EntrepreneurPulse.com.au  Fact Sheet for Healthcare Providers: IncredibleEmployment.be  This test is not yet approved or cleared by the [place] FDA and has been authorized for detection and/or diagnosis of SARS-CoV-2 by FDA under an Emergency Use Authorization (EUA). This EUA will remain in effect (meaning this test can be used) for the duration of the COVID-19 declaration under Section 564(b)(1) of the Act, 21 U.S.C. section  360bbb-3(b)(1), unless the authorization is terminated or revoked.  Performed at [facility], [place] [address], [place], [place] [address]     Procedures and diagnostic studies:  No results found.  Medications:   . aspirin EC  81 mg Oral Daily  . atovaquone  1,500 mg Oral Q breakfast  . carvedilol  6.25 mg Oral BID WC  . docusate sodium  100 mg Oral BID  . fluconazole  200 mg Oral Daily  . nicotine  14 mg Transdermal Daily  . sacubitril-valsartan  1 tablet Oral BID  . sodium chloride flush  3 mL Intravenous Q12H  . spironolactone  12.5 mg Oral Daily   Continuous Infusions: . sodium chloride       LOS: 7 days   Geradine Girt  [facility]   How to contact the [facility] Attending or Consulting provider [place] or covering provider during after hours [place], for this patient?  1. Check the care team in [facility] and look for a) attending/consulting TR[facility] provider listed and b) the [place] team listed 2. Log into [email or web address] and use [place]'s universal password to access. If you do not have the password, please contact the hospital operator. 3. Locate the [facility] provider you are looking for under [facility] and page to a number that you can be directly reached. 4. If you still have difficulty reaching the provider, please page the [facility] (Director on Call) for the Hospitalists listed on amion for assistance.  12/28/2020, 1:13 PM

## 2020-12-28 NOTE — Progress Notes (Addendum)
Heart Failure Stewardship Pharmacist Progress Note   PCP: Placey, Audrea Muscat, NP PCP-Cardiologist: No primary care provider on file.    HPI:  45 YO female with PMH of CAD, CHF, HIV, HTN, polysubstance abuse, and homelessness. Presented to ED with trouble breathing and chest pain. Patient has not been adherence to home medications. Description consistent with severe orthopnea. ECHO completed on 12/22/20 showing an EF of 20%.   Current HF Medications: Carvedilol 6.25 mg BID Entresto 24/26 mg BID  Prior to admission HF Medications: *Not taking these medications, but on PTA list Furosemide 20 mg daily x 5 days Metoprolol tartrate 12.5 mg BID  Hydralazine 25 mg TID  Pertinent Lab Values (as of 12/27/20): Marland Kitchen Serum creatinine 1.12, BUN 23, Potassium 4.1, Sodium 136, BNP 789  Vital Signs: . Weight: 108 lbs (admission weight: 143 lbs) . Blood pressure: 110-120/70-90s . Heart rate: 60s  Medication Assistance / Insurance Benefits Check: Does the patient have prescription insurance?  Yes Type of insurance plan: [place] Medicaid (working on transitioning to [facility])  Outpatient Pharmacy:  [facility] Is the patient willing to use [facility] at discharge? Yes Is the patient willing to transition their outpatient pharmacy to utilize a [facility] outpatient pharmacy?   Pending    Assessment: 1. Acute on chronic systolic CHF (EF 19%), with multifactoral etiolgy and medication noncompliance. NYHA class II symptoms. - Continue carvedilol 6.25 mg BID - Continue Entresto 24/26 mg BID - Consider adding spironolactone +/- SGLT2i pending BP and SCr trends  2. Medication noncompliance - [place] Medicaid pending; currently has active [facility] Medicaid - She is willing to start taking medications for HF but is refusing to take any HIV medications even after education on therapy. She states that in the past she got sick after taking HIV meds and is no longer interested in  discussing therapy options.  - [facility] and she has only picked up Bactrim and nystatin (all other meds were on hold) so she has been off chronic therapy for an unknown amount of time. - Per EPIC dispense record, she has last filled lasix on 09/06/20 x 30 day supply.   Plan: 1) Medication changes recommended at this time: - Add spironolactone 12.5 mg daily - Discontinue amlodipine since this does not have CV or HF benefit compared to spironolactone  2) Patient assistance application(s) / nonadherence: - Consulted HF RN navigator - appreciate assistance - Entresto 30-day free card to be provided from [facility] and long-term patient assistance to be provided from [facility] (PCP clinic)  3)  Education  - To be completed prior to discharge   Kerby Nora, PharmD, BCPS Heart Failure Stewardship Pharmacist Phone [phone]

## 2020-12-29 ENCOUNTER — Other Ambulatory Visit (HOSPITAL_COMMUNITY): Payer: Self-pay | Admitting: Internal Medicine

## 2020-12-29 DIAGNOSIS — I5023 Acute on chronic systolic (congestive) heart failure: Secondary | ICD-10-CM

## 2020-12-29 DIAGNOSIS — B20 Human immunodeficiency virus [HIV] disease: Secondary | ICD-10-CM | POA: Diagnosis not present

## 2020-12-29 LAB — LIPID PANEL
Cholesterol: 220 mg/dL — ABNORMAL HIGH (ref 0–200)
HDL: 40 mg/dL — ABNORMAL LOW (ref >40)
LDL Cholesterol: 121 mg/dL — ABNORMAL HIGH (ref 0–99)
Total CHOL/HDL Ratio: 5.5 RATIO
Triglycerides: 294 mg/dL — ABNORMAL HIGH (ref <150)
VLDL: 59 mg/dL — ABNORMAL HIGH (ref 0–40)

## 2020-12-29 LAB — BASIC METABOLIC PANEL
Anion gap: 7 (ref 5–15)
BUN: 20 mg/dL (ref 6–20)
CO2: 25 mmol/L (ref 22–32)
Calcium: 9.2 mg/dL (ref 8.9–10.3)
Chloride: 104 mmol/L (ref 98–111)
Creatinine, Ser: 1.06 mg/dL — ABNORMAL HIGH (ref 0.44–1.00)
GFR, Estimated: >60 mL/min (ref >60)
Glucose, Bld: 98 mg/dL (ref 70–99)
Potassium: 4.6 mmol/L (ref 3.5–5.1)
Sodium: 136 mmol/L (ref 135–145)

## 2020-12-29 MED ORDER — FLUCONAZOLE 200 MG PO TABS
200.0000 mg | ORAL_TABLET | Freq: Every day | ORAL | 0 refills | Status: DC
Start: 2020-12-29 — End: 2020-12-29

## 2020-12-29 MED ORDER — CARVEDILOL 6.25 MG PO TABS
6.2500 mg | ORAL_TABLET | Freq: Two times a day (BID) | ORAL | 0 refills | Status: DC
Start: 2020-12-29 — End: 2020-12-29

## 2020-12-29 MED ORDER — NICOTINE 14 MG/24HR TD PT24: 14.0000 mg | MEDICATED_PATCH | Freq: Every day | TRANSDERMAL | 0 refills | Status: DC

## 2020-12-29 MED ORDER — ATOVAQUONE 750 MG/5ML PO SUSP: 1500.0000 mg | Freq: Every day | ORAL | 0 refills | Status: DC

## 2020-12-29 MED ORDER — ASPIRIN 81 MG PO TBEC: 81.0000 mg | DELAYED_RELEASE_TABLET | Freq: Every day | ORAL | 0 refills | Status: DC

## 2020-12-29 MED ORDER — ENSURE ENLIVE PO LIQD: 237.0000 mL | Freq: Two times a day (BID) | ORAL | Status: DC

## 2020-12-29 MED ORDER — SACUBITRIL-VALSARTAN 24-26 MG PO TABS: 1.0000 | ORAL_TABLET | Freq: Two times a day (BID) | ORAL | 0 refills | Status: DC

## 2020-12-29 MED ORDER — SACUBITRIL-VALSARTAN 24-26 MG PO TABS
1.0000 | ORAL_TABLET | Freq: Two times a day (BID) | ORAL | 0 refills | Status: DC
Start: 2020-12-29 — End: 2020-12-29

## 2020-12-29 MED ORDER — ATORVASTATIN CALCIUM 40 MG PO TABS: 40.0000 mg | ORAL_TABLET | Freq: Every day | ORAL | 0 refills | Status: AC

## 2020-12-29 MED ORDER — ASPIRIN 81 MG PO TBEC
81.0000 mg | DELAYED_RELEASE_TABLET | Freq: Every day | ORAL | 0 refills | Status: DC
Start: 2020-12-29 — End: 2020-12-29

## 2020-12-29 MED ORDER — ENOXAPARIN SODIUM 40 MG/0.4ML ~~LOC~~ SOLN: 40.0000 mg | SUBCUTANEOUS | Status: DC

## 2020-12-29 MED ORDER — ATOVAQUONE 750 MG/5ML PO SUSP: 1500.0000 mg | Freq: Every day | ORAL | 2 refills | Status: DC

## 2020-12-29 MED ORDER — SPIRONOLACTONE 25 MG PO TABS: 12.5000 mg | ORAL_TABLET | Freq: Every day | ORAL | 0 refills | Status: DC

## 2020-12-29 MED ORDER — FLUCONAZOLE 200 MG PO TABS: 200.0000 mg | ORAL_TABLET | Freq: Every day | ORAL | 0 refills | Status: DC

## 2020-12-29 MED ORDER — ATORVASTATIN CALCIUM 40 MG PO TABS: 40.0000 mg | ORAL_TABLET | Freq: Every day | ORAL | Status: DC

## 2020-12-29 NOTE — Consult Note (Addendum)
Advanced Heart Failure Team Consult Note   Primary Physician: Primary Cardiologist:    Reason for Consultation: Combined Systolic/Diastolic CHF  HPI:    Jane Estrada is seen today for evaluation of acute on chronic Combined Systolic/Diastolic CHF at the request of Dr. [place] Crane.   Jane Estrada an 45 y.o.femalewith medical history significant for CAD; chronic combined diastolic and systolic CHF; HIV not on HAART; HTN; bipolar disorder; granular cell tumor of the trachea; polysubstance abuse including THC; homelessness; who presented with chest pain. Previous followed by cardiology in [place]. Cath 2006 no CAD>   Reported to ER with CP and worsening HF symptoms.   Upon presentation troponin elevated at 45.  Sinus tachycardia on twelve-lead EKG with nonspecific ST-T changes, QTC prolonged at 552 on 12/21/2020.  2D echo done on 12/22/2020 showed LVEF 20%, left ventricle has severely decreased function, left ventricle demonstrate global hypokinesis.  Left ventricular internal cavity is severely dilated.  Grade 2 diastolic dysfunction.  Previous 2D echo done on 03/15/2020 showed LVEF 45 to 50% with grade 1 diastolic dysfunction.  Patient reports she is feeling much better today, denies shortness of breath, chest pain.  She has her bags packed and is anxious to leave. She spent a copious amount of time talking about the side effects of medications both for HIV and CHF and how she prefers herbal remedies.  She also mentioned that she felt physicians were trying to control her and make her dependent on medications when her body is fully capable of helping itself.  She does admit that she had significant improvement in her symptoms with diuretics but does not want this long term. We discussed some of her medical history.  She reports longstanding uncontrolled HIV and HTN, CAD with no intervention but mentions CABG was suggested at one time.    Review of Systems  All other systems reviewed and are  negative.   Home Medications Prior to Admission medications   Medication Sig Start Date End Date Taking? Authorizing Provider  aspirin 81 MG EC tablet Take 1 tablet (81 mg total) by mouth daily. Swallow whole. 12/29/20 12/24/21  Kayleen Memos, DO  atorvastatin (LIPITOR) 40 MG tablet Take 1 tablet (40 mg total) by mouth daily. 12/29/20 03/29/21  Kayleen Memos, DO  atovaquone (MEPRON) 750 MG/5ML suspension Take 10 mLs (1,500 mg total) by mouth daily with breakfast. 12/29/20 03/29/21  Kayleen Memos, DO  carvedilol (COREG) 6.25 MG tablet Take 1 tablet (6.25 mg total) by mouth 2 (two) times daily with a meal. 12/29/20 03/29/21  Kayleen Memos, DO  fluconazole (DIFLUCAN) 200 MG tablet Take 1 tablet (200 mg total) by mouth daily for 15 doses. 12/29/20 01/13/21  Kayleen Memos, DO  nicotine (NICODERM CQ - DOSED IN MG/24 HOURS) 14 mg/24hr patch Place 1 patch (14 mg total) onto the skin daily. 12/29/20   Kayleen Memos, DO  sacubitril-valsartan (ENTRESTO) 24-26 MG Take 1 tablet by mouth 2 (two) times daily. 12/29/20 03/29/21  Kayleen Memos, DO  spironolactone (ALDACTONE) 25 MG tablet Take 0.5 tablets (12.5 mg total) by mouth daily. 12/29/20 03/29/21  Kayleen Memos, DO    Past Medical History: Past Medical History:  Diagnosis Date  . Bipolar affective ([place])   . Chronic systolic CHF (congestive heart failure) ([place])   . Granular cell tumor   . HIV (human immunodeficiency virus infection) ([place])   . Hypertension   . Myocardial infarct ([place]) 2006  . Tobacco dependence  Past Surgical History: Past Surgical History:  Procedure Laterality Date  . none      Family History: Family History  Problem Relation Age of Onset  . Hypertension Mother   . Cancer Father     Social History: Social History   Socioeconomic History  . Marital status: Widowed    Spouse name: Not on file  . Number of children: 2  . Years of education: Not on file  . Highest education level: Not on file  Occupational History  .  Occupation: unemployed  Tobacco Use  . Smoking status: Current Every Day Smoker    Packs/day: 0.50    Years: 30.00    Pack years: 15.00    Types: Cigarettes  . Smokeless tobacco: Never Used  Vaping Use  . Vaping Use: Never used  Substance and Sexual Activity  . Alcohol use: Not Currently    Comment: last drink in 02/2020  . Drug use: Yes    Frequency: 1.0 times per week    Types: Marijuana, "Crack" cocaine    Comment: daily marijuana use; denies using other drugs but previously used crack  . Sexual activity: Not Currently  Other Topics Concern  . Not on file  Social History Narrative  . Not on file   Social Determinants of Health   Financial Resource Strain: High Risk  . Difficulty of Paying Living Expenses: Very hard  Food Insecurity: No Food Insecurity  . Worried About Charity fundraiser in the Last Year: Never true  . Ran Out of Food in the Last Year: Never true  Transportation Needs: Unmet Transportation Needs  . Lack of Transportation (Medical): Yes  . Lack of Transportation (Non-Medical): Yes  Physical Activity: Not on file  Stress: Not on file  Social Connections: Moderately Isolated  . Frequency of Communication with Friends and Family: Three times a week  . Frequency of Social Gatherings with Friends and Family: Three times a week  . Attends Religious Services: Never  . Active Member of Clubs or Organizations: Yes  . Attends Archivist Meetings: Never  . Marital Status: Widowed    Allergies:  Allergies  Allergen Reactions  . Lisinopril Anaphylaxis  . Remeron [Mirtazapine] Shortness Of Breath  . Abilify [Aripiprazole] Rash    Objective:    Vital Signs:   Temp:  [97.8 F (36.6 C)-98.5 F (36.9 C)] 97.8 F (36.6 C) (01/19 1234) Pulse Rate:  [62-68] 67 (01/19 1234) Resp:  [14-18] 16 (01/19 1234) BP: (109-135)/(90-98) 109/90 (01/19 1234) SpO2:  [100 %] 100 % (01/19 1234) Weight:  [49.1 kg] 49.1 kg (01/19 0439) Last BM Date:  12/28/20  Weight change: Filed Weights   12/26/20 0500 12/27/20 0336 12/29/20 0439  Weight: 45.4 kg 49.2 kg 49.1 kg    Intake/Output:   Intake/Output Summary (Last 24 hours) at 12/29/2020 1626 Last data filed at 12/28/2020 2324 Gross per 24 hour  Intake 240 ml  Output 200 ml  Net 40 ml      Physical Exam    General:  Well appearing. No resp difficulty HEENT: normal Neck: supple Cor: PMI nondisplaced. Regular rate & rhythm. No rubs, gallops or murmurs. No JVD, no LE edema Lungs: coarse breath sounds in bases Abdomen: soft, nontender, nondistended. No hepatosplenomegaly. No bruits or masses. Good bowel sounds. Extremities: no cyanosis, clubbing, rash, edema Neuro: alert & orientedx3, cranial nerves grossly intact. moves all 4 extremities w/o difficulty. Affect pleasant   EKG    Sinus tach 102 with LVH and  repol Personally reviewed  Labs   Basic Metabolic Panel: Recent Labs  Lab 12/23/20 0325 12/24/20 0356 12/25/20 0033 12/27/20 0330 12/29/20 0241  NA 134* 136 134* 136 136  K 3.4* 4.2 3.7 4.1 4.6  CL 99 100 98 102 104  CO2 24 25 25 25 25   GLUCOSE 102* 110* 112* 104* 98  BUN 25* 24* 26* 23* 20  CREATININE 1.15* 1.20* 1.06* 1.12* 1.06*  CALCIUM 8.7* 8.7* 9.0 9.1 9.2    Liver Function Tests: No results for input(s): AST, ALT, ALKPHOS, BILITOT, PROT, ALBUMIN in the last 168 hours. No results for input(s): LIPASE, AMYLASE in the last 168 hours. No results for input(s): AMMONIA in the last 168 hours.  CBC: Recent Labs  Lab 12/25/20 0033 12/27/20 0330  WBC 1.5* 1.1*  HGB 10.4* 9.9*  HCT 34.8* 32.2*  MCV 90.2 89.4  PLT 117* 110*    Cardiac Enzymes: No results for input(s): CKTOTAL, CKMB, CKMBINDEX, TROPONINI in the last 168 hours.  BNP: BNP (last 3 results) Recent Labs    12/06/20 0349 12/21/20 0909  BNP 788.9* 1,223.5*    ProBNP (last 3 results) No results for input(s): PROBNP in the last 8760 hours.   CBG: No results for input(s): GLUCAP  in the last 168 hours.  Coagulation Studies: No results for input(s): LABPROT, INR in the last 72 hours.   Imaging    No results found.   Medications:     Current Medications: . aspirin EC  81 mg Oral Daily  . atorvastatin  40 mg Oral Daily  . atovaquone  1,500 mg Oral Q breakfast  . carvedilol  6.25 mg Oral BID WC  . docusate sodium  100 mg Oral BID  . enoxaparin (LOVENOX) injection  40 mg Subcutaneous Q24H  . feeding supplement  237 mL Oral BID BM  . fluconazole  200 mg Oral Daily  . nicotine  14 mg Transdermal Daily  . sacubitril-valsartan  1 tablet Oral BID  . sodium chloride flush  3 mL Intravenous Q12H  . spironolactone  12.5 mg Oral Daily     Infusions: . sodium chloride         Patient Profile   45 yo female with chronic combined diastolic and systolic CHF; HIV not on HAART; HTN; bipolar disorder; granular cell tumor of the trachea; polysubstance abuse including THC; homelessness; who presented with dyspnea and chest pain.  She was diuresed and started on GDMT with Entresto, spironolactone and coreg.      Assessment/Plan   Combined Systolic/Diastolic CHF: severely reduced EF, differential broad as to the cause however outside records indicate predominant thought was due to crack cocaine use.  Also has poorly controlled HIV since 2005, MI in 2006 thought to be due to cocaine use with no significant coronary disease documented on 2006 LHC, no plaque seen on CT here. History of polysubstance use.  Had anaphylactic reaction to lisinopril in the past. Diuresed, started on coreg, entresto and spironolactone here.    - Unfortunately could not convince the patient to take her medications outpatient but she was at least discharged with a prescription for coreg, entresto and spironolactone which she was tolerating well here -can follow up in outpatient setting if she is willing, despite comorbid psychiatric illness she does seem to have capacity    HIV/AIDS: Seen by  infectious disease, she is not willing to start ART  -encouraged to follow up outpatient   Bipolar disorder/Depression: Not currently on medication therapy or following with  psychiatry  -encouraged to follow up outpatient   Polysubstance Use: History of crack cocaine use, methamphetamine use, alcohol use  -encouraged cessation, when she is more receptive could benefit from a variety of local services to improve her situation.   Length of Stay: [place], MD  12/29/2020, 4:26 PM  Advanced Heart Failure Team Pager [phone] (M-F; 7a - 4p)  Please contact [facility] for night-coverage after hours (4p -7a ) and weekends on [email or web address]  Patient seen and examined with the above-signed Advanced Practice Provider and/or Housestaff. I personally reviewed laboratory data, imaging studies and relevant notes. I independently examined the patient and formulated the important aspects of the plan. I have edited the note to reflect any of my changes or salient points. I have personally discussed the plan with the patient and/or family.  45 y/o woman with HIV, polysubstance abuse, bipolar d/o admitted with a/c HF. EF found to be down from 45% -> 20%. BNP 1,223.  Diuresed about 2 pounds. Remains tachycardic   General:  Sitting up. No resp difficulty HEENT: normal Neck: supple. no JVD. Carotids 2+ bilat; no bruits. No lymphadenopathy or thryomegaly appreciated. Cor: PMI nondisplaced. Regular tachy Lungs: clear Abdomen: soft, nontender, nondistended. No hepatosplenomegaly. No bruits or masses. Good bowel sounds. Extremities: no cyanosis, clubbing, rash, edema Neuro: alert & orientedx3, cranial nerves grossly intact. moves all 4 extremities w/o difficulty. Affect pleasant  Volume status does not look too bad but EF markedly depressed and she remains quite tachycardic. Etiology of worsening LV function remains unclear. Ideally would keep for cMRI and titration of GDMT. However, patient  refuses to stay for further evaluation or take meds. Says she prefers to take herbals. Please let us know if patient changes her mind and is willing to accept care.  Glori Bickers, MD

## 2020-12-29 NOTE — Discharge Instructions (Signed)
Heart Failure, Self-Care Heart failure is a serious condition. The following information explains things you need to do to take care of yourself at home. To help you stay as healthy as possible, you may be asked to change your diet, take certain medicines, and make other changes in your life. Your doctor may also give you more specific instructions. If you have problems or questions, call your doctor. What are the risks? Having heart failure makes it more likely for you to have some problems. These problems can get worse if you do not take good care of yourself. Problems may include:  Damage to the kidneys, liver, or lungs.  Malnutrition.  Abnormal heart rhythms.  Blood clotting problems that could cause a stroke. Supplies needed:  Scale for weighing yourself.  Blood pressure monitor.  Notebook.  Medicines. How to care for yourself when you have heart failure Medicines Take over-the-counter and prescription medicines only as told by your doctor. Take your medicines every day.  Do not stop taking your medicine unless your doctor tells you to do so.  Do not skip any medicines.  Get your prescriptions refilled before you run out of medicine. This is important.  Talk with your doctor if you cannot afford your medicines. Eating and drinking  Eat heart-healthy foods. Talk with a diet specialist (dietitian) to create an eating plan.  Limit salt (sodium) if told by your doctor. Ask your diet specialist to tell you which seasonings are healthy for your heart.  Cook in healthy ways instead of frying. Healthy ways of cooking include roasting, grilling, broiling, baking, poaching, steaming, and stir-frying.  Choose foods that: ? Have no trans fat. ? Are low in saturated fat and cholesterol.  Choose healthy foods, such as: ? Fresh or frozen fruits and vegetables. ? Fish. ? Low-fat (lean) meats. ? Legumes, such as beans, peas, and lentils. ? Fat-free or low-fat dairy  products. ? Whole-grain foods. ? High-fiber foods.  Limit how much fluid you drink, if told by your doctor.   Alcohol use  Do not drink alcohol if: ? Your doctor tells you not to drink. ? Your heart was damaged by alcohol, or you have very bad heart failure. ? You are pregnant, may be pregnant, or are planning to become pregnant.  If you drink alcohol: ? Limit how much you have to:  0-1 drink a day for women.  0-2 drinks a day for men. ? Know how much alcohol is in your drink. In the [place] one drink equals one 12 oz bottle of beer (355 mL), one 5 oz glass of wine (148 mL), or one 1 oz glass of hard liquor (44 mL). Lifestyle  Do not smoke or use any products that contain nicotine or tobacco. If you need help quitting, ask your doctor. ? Do not use nicotine gum or patches before talking to your doctor.  Do not use illegal drugs.  Lose weight if told by your doctor.  Do physical activity if told by your doctor. Talk to your doctor before you begin an exercise if: ? You are an older adult. ? You have very bad heart failure.  Learn to manage stress. If you need help, ask your doctor.  Get physical rehab (rehabilitation) to help you stay independent and to help with your quality of life.  Participate in a cardiac rehab program. This program helps you improve your health through exercise, education, and counseling.  Plan time to rest when you get tired.   Check weight   and blood pressure  Weigh yourself every day. This will help you to know if fluid is building up in your body. ? Weigh yourself every morning after you pee (urinate) and before you eat breakfast. ? Wear the same amount of clothing each time. ? Write down your daily weight. Give your record to your doctor.  Check and write down your blood pressure as told by your doctor.  Check your pulse as told by your doctor.   Dealing with very hot and very cold weather  If it is very hot: ? Avoid activities that take a  lot of energy. ? Use air conditioning or fans, or find a cooler place. ? Avoid caffeine and alcohol. ? Wear clothing that is loose-fitting, lightweight, and light-colored.  If it is very cold: ? Avoid activities that take a lot of energy. ? Layer your clothes. ? Wear mittens or gloves, a hat, and a face covering when you go outside. ? Avoid alcohol. Follow these instructions at home:  Stay up to date with shots (vaccines). Get pneumococcal and flu (influenza) shots.  Keep all follow-up visits. Contact a doctor if:  You gain 2-3 lb (1-1.4 kg) in 24 hours or 5 lb (2.3 kg) in a week.  You have increasing shortness of breath.  You cannot do your normal activities.  You get tired easily.  You cough a lot.  You do not feel like eating or feel like you may vomit (nauseous).  You have swelling in your hands, feet, ankles, or belly (abdomen).  You cannot sleep well because it is hard to breathe.  You feel like your heart is beating fast (palpitations).  You get dizzy when you stand up.  You feel depressed or sad. Get help right away if:  You have trouble breathing.  You or someone else notices a change in your behavior, such as having trouble staying awake.  You have chest pain or discomfort.  You pass out (faint). These symptoms may be an emergency. Get help right away. Call your local emergency services (911 in the [place]  Do not wait to see if the symptoms will go away.  Do not drive yourself to the hospital. Summary  Heart failure is a serious condition. To care for yourself, you may have to change your diet, take medicines, and make other lifestyle changes.  Take your medicines every day. Do not stop taking them unless your doctor tells you to do so.  Limit salt and eat heart-healthy foods.  Ask your doctor if you can drink alcohol. You may have to stop alcohol use if you have very bad heart failure.  Contact your doctor if you gain weight quickly or feel  that your heart is beating too fast. Get help right away if you pass out or have chest pain or trouble breathing. This information is not intended to replace advice given to you by your health care provider. Make sure you discuss any questions you have with your health care provider. Document Revised: 06/19/2020 Document Reviewed: 06/19/2020 Elsevier Patient Education  2021 [employer]  

## 2020-12-29 NOTE — Progress Notes (Signed)
Jane Estrada to be D/C'd per MD order.  Discussed with the patient and all questions fully answered.   VSS, Skin clean, dry and intact without evidence of skin break down, no evidence of skin tears noted. IV catheter discontinued intact. Site without signs and symptoms of complications. Dressing and pressure applied.   An After Visit Summary was printed and given to the patient.    D/C education completed with patient/family including follow up instructions, medication list, d/c activities limitations if indicated, with other d/c instructions as indicated by MD - patient able to verbalize understanding, all questions fully answered.    Patient instructed to return to ED, call 911, or call MD for any changes in condition.    Patient escorted via WC, and D/C.

## 2020-12-29 NOTE — Progress Notes (Addendum)
PROGRESS NOTE  Jane Estrada [id number] DOB: [birth year] DOA: 12/20/2020 PCP: Marliss Coots, NP  HPI/Recap of past 24 hours: Jane Estrada is an 45 y.o. female with medical history significant for CAD; chronic combined diastolic and systolic CHF; HIV not on HAART; HTN; bipolar disorder; granular cell tumor of the trachea; polysubstance abuse including THC; homelessness; who presented with chest pain.  She reports that she has "been going through it." Not able to breathe, CHF acting up, fluid on her lungs. She had an esophageal infection in September and it recurred and that was the beginning of it all. She came to the hospital with similar symptoms - and she wasn't treated. 2 months later on 11/16/20 she came back and was told that she had an esophageal infection. Patient's situation complicated by homelessness.    Upon presentation troponin elevated at 45.  Sinus tachycardia on twelve-lead EKG with nonspecific ST-T changes, QTC prolonged at 552 on 12/21/2020.  2D echo done on 12/22/2020 showed LVEF 20%, left ventricle has severely decreased function, left ventricle demonstrate global hypokinesis.  Left ventricular internal cavity is severely dilated.  Grade 2 diastolic dysfunction.  Previous 2D echo done on 03/15/2020 showed LVEF 45 to 50% with grade 1 diastolic dysfunction.  12/29/20: Patient was seen and examined at her bedside.  She reports she was followed by cardiology in [place].  She has not established care with cardiology in [place].  Cardiology has been consulted, likely will need to follow-up with cardiology outpatient, she is receptive.  Assessment/Plan: Principal Problem:   HIV (human immunodeficiency virus infection) ([place]) Active Problems:   Chest pain   Tobacco dependence   Hypertension   Bipolar affective ([place])   Polysubstance abuse ([place])   Pancytopenia ([facility])   Shortness of breath   Thrush   Need for pneumocystis prophylaxis   Heart failure ([facility])  Chest pain rule out  ACS Initially presented with chest pain, elevated troponin, peaked at 45, no evidence of acute ischemia on twelve-lead EKG done on 12/21/2020 Worsening LVEF 20% from 45 to 50% on 2D echo with grade 2 diastolic dysfunction.  She was started on Coreg, Entresto and spironolactone. Cardiology has been consulted. Continue to closely monitor on telemetry  Acute on chronic combined diastolic and systolic CHF She presented with elevated BNP greater than 1200 2D echo done on 12/22/2020 showed the above findings Continue strict I's and O's and daily weight Net I&O -2.0 L She is currently on Coreg 6.25 mg twice daily, Entresto 24-26 mg twice daily, spironolactone 12.5 mg daily. Cardiology consulted  Recent syncopal episode, suspected related to hypotension from multiple BP meds Lasix was held, she had no recurrent events Continue to closely monitor Fall precautions  HIV not on HAART Per infectious disease, will follow-up in the clinic outpatient on 01/04/2021 at 3:15 PM.  Recommended to continue fluconazole for 14 days. Patient has been agreeable to PCP prophylaxis.  Atovaquone likely better choice given her pancytopenia and report of Bactrim tearing of her stomach.  Pancytopenia likely related to untreated HIV Continue to closely monitor  Oral thrush in the setting of immunosuppression Continue fluconazole 200 mg orally daily x14 days.  Essential hypertension BP is currently at goal on current medications Continue to monitor  History of coronary artery disease Continue aspirin Last LDL 114 on 03/15/2020 Obtain lipid panel Start Lipitor 40 mg daily.  Resolved hyponatremia and hypokalemia  Polysubstance abuse including tobacco and THC UDS positive for THC on 12/21/2020 Continue nicotine patch Polysubstance cessation counseling  Homelessness Patient has declined to go to homeless shelter Appreciate TOC's assistance with available resources    Code Status: DNR  Family Communication:  None at bedside  Disposition Plan: Will discharge once cardiology signs off.   Consultants:  Cardiology consulted on 12/29/2020, via [facility].  Procedures:  2D echo on 12/22/2020.  Antimicrobials:  None.  DVT prophylaxis: Subcu Lovenox daily  Status is: Inpatient    Dispo:  Patient From: Other  Planned Disposition: Homeless/Shelter  Expected discharge date: 12/30/20  Medically stable for discharge: No, ongoing management of acute on chronic combined diastolic and systolic CHF.        Objective: Vitals:   12/28/20 1738 12/28/20 2322 12/29/20 0439 12/29/20 1234  BP: (!) 131/98 (!) 130/96 (!) 135/95 109/90  Pulse: 63 68 62 67  Resp: 18 16 14 16   Temp: 98.2 F (36.8 C) 98.5 F (36.9 C) 98.4 F (36.9 C) 97.8 F (36.6 C)  TempSrc: Oral Oral Oral Oral  SpO2: 100% 100% 100% 100%  Weight:   49.1 kg   Height:        Intake/Output Summary (Last 24 hours) at 12/29/2020 1244 Last data filed at 12/28/2020 2324 Gross per 24 hour  Intake 240 ml  Output 200 ml  Net 40 ml   Filed Weights   12/26/20 0500 12/27/20 0336 12/29/20 0439  Weight: 45.4 kg 49.2 kg 49.1 kg    Exam:  . General: 45 y.o. year-old female well developed well nourished in no acute distress.  Alert and oriented x3. . Cardiovascular: Regular rate and rhythm with no rubs or gallops.  No thyromegaly or JVD noted.   Marland Kitchen Respiratory: Clear to auscultation with no wheezes or rales. Good inspiratory effort. . Abdomen: Soft nontender nondistended with normal bowel sounds present. . Musculoskeletal: No lower extremity edema bilaterally.   Marland Kitchen Psychiatry: Mood is appropriate for condition and setting   Data Reviewed: CBC: Recent Labs  Lab 12/25/20 0033 12/27/20 0330  WBC 1.5* 1.1*  HGB 10.4* 9.9*  HCT 34.8* 32.2*  MCV 90.2 89.4  PLT 117* 174*   Basic Metabolic Panel: Recent Labs  Lab 12/23/20 0325 12/24/20 0356 12/25/20 0033 12/27/20 0330 12/29/20 0241  NA 134* 136 134* 136 136  K 3.4*  4.2 3.7 4.1 4.6  CL 99 100 98 102 104  CO2 24 25 25 25 25   GLUCOSE 102* 110* 112* 104* 98  BUN 25* 24* 26* 23* 20  CREATININE 1.15* 1.20* 1.06* 1.12* 1.06*  CALCIUM 8.7* 8.7* 9.0 9.1 9.2   GFR: Estimated Creatinine Clearance: 53 mL/min (A) (by C-G formula based on SCr of 1.06 mg/dL (H)). Liver Function Tests: No results for input(s): AST, ALT, ALKPHOS, BILITOT, PROT, ALBUMIN in the last 168 hours. No results for input(s): LIPASE, AMYLASE in the last 168 hours. No results for input(s): AMMONIA in the last 168 hours. Coagulation Profile: No results for input(s): INR, PROTIME in the last 168 hours. Cardiac Enzymes: No results for input(s): CKTOTAL, CKMB, CKMBINDEX, TROPONINI in the last 168 hours. BNP (last 3 results) No results for input(s): PROBNP in the last 8760 hours. HbA1C: No results for input(s): HGBA1C in the last 72 hours. CBG: No results for input(s): GLUCAP in the last 168 hours. Lipid Profile: No results for input(s): CHOL, HDL, LDLCALC, TRIG, CHOLHDL, LDLDIRECT in the last 72 hours. Thyroid Function Tests: No results for input(s): TSH, T4TOTAL, FREET4, T3FREE, THYROIDAB in the last 72 hours. Anemia Panel: No results for input(s): VITAMINB12, FOLATE, FERRITIN, TIBC, IRON, RETICCTPCT in  the last 72 hours. Urine analysis:    Component Value Date/Time   COLORURINE YELLOW 09/16/2020 1227   APPEARANCEUR HAZY (A) 09/16/2020 1227   LABSPEC 1.012 09/16/2020 1227   PHURINE 7.0 09/16/2020 1227   GLUCOSEU NEGATIVE 09/16/2020 1227   HGBUR MODERATE (A) 09/16/2020 1227   BILIRUBINUR NEGATIVE 09/16/2020 1227   KETONESUR NEGATIVE 09/16/2020 1227   PROTEINUR NEGATIVE 09/16/2020 1227   NITRITE NEGATIVE 09/16/2020 1227   LEUKOCYTESUR NEGATIVE 09/16/2020 1227   Sepsis Labs: @LABRCNTIP (procalcitonin:4,lacticidven:4)  ) Recent Results (from the past 240 hour(s))  Resp Panel by RT-PCR (Flu A&B, Covid) Nasopharyngeal Swab     Status: None   Collection Time: 12/21/20 12:32 PM    Specimen: Nasopharyngeal Swab; Nasopharyngeal(NP) swabs in vial transport medium  Result Value Ref Range Status   SARS Coronavirus 2 by RT PCR NEGATIVE NEGATIVE Final    Comment: (NOTE) SARS-CoV-2 target nucleic acids are NOT DETECTED.  The SARS-CoV-2 RNA is generally detectable in upper respiratory specimens during the acute phase of infection. The lowest concentration of SARS-CoV-2 viral copies this assay can detect is 138 copies/mL. A negative result does not preclude SARS-Cov-2 infection and should not be used as the sole basis for treatment or other patient management decisions. A negative result may occur with  improper specimen collection/handling, submission of specimen other than nasopharyngeal swab, presence of viral mutation(s) within the areas targeted by this assay, and inadequate number of viral copies(<138 copies/mL). A negative result must be combined with clinical observations, patient history, and epidemiological information. The expected result is Negative.  Fact Sheet for Patients:  EntrepreneurPulse.com.au  Fact Sheet for Healthcare Providers:  IncredibleEmployment.be  This test is no t yet approved or cleared by the [place] FDA and  has been authorized for detection and/or diagnosis of SARS-CoV-2 by FDA under an Emergency Use Authorization (EUA). This EUA will remain  in effect (meaning this test can be used) for the duration of the COVID-19 declaration under Section 564(b)(1) of the Act, 21 U.S.C.section 360bbb-3(b)(1), unless the authorization is terminated  or revoked sooner.       Influenza A by PCR NEGATIVE NEGATIVE Final   Influenza B by PCR NEGATIVE NEGATIVE Final    Comment: (NOTE) The Xpert Xpress SARS-CoV-2/FLU/RSV plus assay is intended as an aid in the diagnosis of influenza from Nasopharyngeal swab specimens and should not be used as a sole basis for treatment. Nasal washings and aspirates are  unacceptable for Xpert Xpress SARS-CoV-2/FLU/RSV testing.  Fact Sheet for Patients: EntrepreneurPulse.com.au  Fact Sheet for Healthcare Providers: IncredibleEmployment.be  This test is not yet approved or cleared by the [place] FDA and has been authorized for detection and/or diagnosis of SARS-CoV-2 by FDA under an Emergency Use Authorization (EUA). This EUA will remain in effect (meaning this test can be used) for the duration of the COVID-19 declaration under Section 564(b)(1) of the Act, 21 U.S.C. section 360bbb-3(b)(1), unless the authorization is terminated or revoked.  Performed at [facility], [place] [address] [place], [place] [address]       Studies: No results found.  Scheduled Meds: . aspirin EC  81 mg Oral Daily  . atorvastatin  40 mg Oral Daily  . atovaquone  1,500 mg Oral Q breakfast  . carvedilol  6.25 mg Oral BID WC  . docusate sodium  100 mg Oral BID  . feeding supplement  237 mL Oral BID BM  . fluconazole  200 mg Oral Daily  . nicotine  14 mg Transdermal  Daily  . sacubitril-valsartan  1 tablet Oral BID  . sodium chloride flush  3 mL Intravenous Q12H  . spironolactone  12.5 mg Oral Daily    Continuous Infusions: . sodium chloride       LOS: 8 days     Kayleen Memos, MD [facility] Pager [phone]  If 7PM-7AM, please contact night-coverage [email or web address] Password [facility] 12/29/2020, 12:44 PM

## 2020-12-29 NOTE — Discharge Summary (Signed)
Discharge Summary  Jane Estrada OEV:[id number] DOB: 19-Mar-1976  PCP: Marliss Coots, NP  Admit date: 12/20/2020 Discharge date: 12/29/2020  Time spent: 35 minutes  Recommendations for Outpatient Follow-up:  1. Follow-up with cardiology, cardiology will arrange for an appointment outpatient for you. 2. Follow-up with your PCP 3. Take your medications as prescribed.  Discharge Diagnoses:  Active Hospital Problems   Diagnosis Date Noted  . HIV (human immunodeficiency virus infection) ([place])   . Shortness of breath 12/21/2020  . Thrush 12/21/2020  . Need for pneumocystis prophylaxis 12/21/2020  . Heart failure ([place])   . Chest pain 03/15/2020  . Polysubstance abuse ([place]) 03/15/2020  . Pancytopenia ([place]) 03/15/2020  . Tobacco dependence   . Hypertension   . Bipolar affective [facility])     Resolved Hospital Problems  No resolved problems to display.    Discharge Condition: Stable.  Diet recommendation: Heart healthy diet.  Vitals:   12/29/20 0439 12/29/20 1234  BP: (!) 135/95 109/90  Pulse: 62 67  Resp: 14 16  Temp: 98.4 F (36.9 C) 97.8 F (36.6 C)  SpO2: 100% 100%    History of present illness:  Jane Reeseis an 45 y.o.femalewith medical history significant for CAD; chronic combined diastolic and systolic CHF; HIV not on HAART; HTN; bipolar disorder; granular cell tumor of the trachea; polysubstance abuse including THC; homelessness; who presented with chest pain.  She reports that she has "been going through it." Not able to breathe, CHF acting up, fluid on her lungs. She had an esophageal infection in September and it recurred and that was the beginning of it all. She came to the hospital with similar symptoms - and she wasn't treated. 2 months later on 11/16/20 she came back and was told that she had an esophageal infection.Patient's situation complicated by homelessness.  Upon presentation troponin elevated at 45.  Sinus tachycardia on twelve-lead EKG with  nonspecific ST-T changes, QTC prolonged at 552 on 12/21/2020.  2D echo done on 12/22/2020 showed LVEF 20%, left ventricle has severely decreased function, left ventricle demonstrate global hypokinesis.  Left ventricular internal cavity is severely dilated.  Grade 2 diastolic dysfunction.  Previous 2D echo done on 03/15/2020 showed LVEF 45 to 50% with grade 1 diastolic dysfunction.  12/29/20: Patient was seen and examined at her bedside.  She reports she was followed by cardiology in [place].  She has not established care with cardiology in [place].  Cardiology has been consulted, likely will need to follow-up with cardiology outpatient, she is receptive.  Addendum: Patient is adamant about being discharged today.  She has declined to go to homeless shelter.  Per cardiomaster, cardiology appointment will be arranged for her, CHF clinic will call her.  Hospital Course:  Principal Problem:   HIV (human immunodeficiency virus infection) ([place]) Active Problems:   Chest pain   Tobacco dependence   Hypertension   Bipolar affective ([place])   Polysubstance abuse ([place])   Pancytopenia (H[facility])   Shortness of breath   Thrush   Need for pneumocystis prophylaxis   Heart failure ([facility])  Chest pain rule out ACS Initially presented with chest pain, elevated troponin, peaked at 45, no evidence of acute ischemia on twelve-lead EKG done on 12/21/2020 Worsening LVEF 20% from 45 to 50% on 2D echo with grade 2 diastolic dysfunction.  She was started on Coreg, Entresto and spironolactone. Cardiology has been consulted. Continue to closely monitor on telemetry  Acute on chronic combined diastolic and systolic CHF She presented with elevated BNP greater than  1200 2D echo done on 12/22/2020 showed the above findings Continue strict I's and O's and daily weight Net I&O -2.0 L She is currently on Coreg 6.25 mg twice daily, Entresto 24-26 mg twice daily, spironolactone 12.5 mg daily. Cardiology consulted  Recent syncopal  episode, suspected related to hypotension from multiple BP meds Lasix was held, she had no recurrent events Continue to closely monitor Fall precautions  HIV not on HAART Per infectious disease, will follow-up in the clinic outpatient on 01/04/2021 at 3:15 PM.  Recommended to continue fluconazole for 14 days. Patient has been agreeable to PCP prophylaxis.  Atovaquone likely better choice given her pancytopenia and report of Bactrim tearing of her stomach.  Pancytopenia likely related to untreated HIV Continue to closely monitor  Oral thrush in the setting of immunosuppression Continue fluconazole 200 mg orally daily x14 days.  Essential hypertension BP is currently at goal on current medications Continue to monitor  History of coronary artery disease Continue aspirin Last LDL 114 on 03/15/2020 Obtain lipid panel Start Lipitor 40 mg daily.  Resolved hyponatremia and hypokalemia  Polysubstance abuse including tobacco and THC UDS positive for THC on 12/21/2020 Continue nicotine patch Polysubstance cessation counseling  Homelessness Patient has declined to go to homeless shelter Appreciate TOC's assistance with available resources    Code Status: DNR  Family Communication: None at bedside  Disposition Plan: Will discharge once cardiology signs off.   Consultants:  Cardiology consulted on 12/29/2020, via cardioMaster.  Procedures:  2D echo on 12/22/2020.  Antimicrobials:  None.   Consultations:  Cardiology, however patient is adamant about being discharged on 12/29/2020.  Cardiology will arrange for outpatient follow-up.  Discharge Exam: BP 109/90 (BP Location: Right Arm)   Pulse 67   Temp 97.8 F (36.6 C) (Oral)   Resp 16   Ht 5\' 4"  (1.626 m)   Wt 49.1 kg   LMP 11/24/2020   SpO2 100%   BMI 18.57 kg/m  . General: 45 y.o. year-old female well developed well nourished in no acute distress.  Alert and oriented x3. . Cardiovascular: Regular  rate and rhythm with no rubs or gallops.  No thyromegaly or JVD noted.   Marland Kitchen Respiratory: Clear to auscultation with no wheezes or rales. Good inspiratory effort. . Abdomen: Soft nontender nondistended with normal bowel sounds x4 quadrants. . Musculoskeletal: No lower extremity edema bilaterally.   Marland Kitchen Psychiatry: Mood is appropriate for condition and setting  Discharge Instructions You were cared for by a hospitalist during your hospital stay. If you have any questions about your discharge medications or the care you received while you were in the hospital after you are discharged, you can call the unit and asked to speak with the hospitalist on call if the hospitalist that took care of you is not available. Once you are discharged, your primary care physician will handle any further medical issues. Please note that NO REFILLS for any discharge medications will be authorized once you are discharged, as it is imperative that you return to your primary care physician (or establish a relationship with a primary care physician if you do not have one) for your aftercare needs so that they can reassess your need for medications and monitor your lab values.   Allergies as of 12/29/2020      Reactions   Lisinopril Anaphylaxis   Remeron [mirtazapine] Shortness Of Breath   Abilify [aripiprazole] Rash      Medication List    STOP taking these medications   amLODipine 10  MG tablet Commonly known as: NORVASC   cefpodoxime 200 MG tablet Commonly known as: VANTIN   furosemide 20 MG tablet Commonly known as: LASIX   hydrALAZINE 25 MG tablet Commonly known as: APRESOLINE   methocarbamol 500 MG tablet Commonly known as: ROBAXIN   metoprolol tartrate 25 MG tablet Commonly known as: LOPRESSOR   ondansetron 4 MG disintegrating tablet Commonly known as: Zofran ODT   pantoprazole 20 MG tablet Commonly known as: PROTONIX   phenazopyridine 200 MG tablet Commonly known as: PYRIDIUM     TAKE these  medications   aspirin 81 MG EC tablet Take 1 tablet (81 mg total) by mouth daily. Swallow whole.   atorvastatin 40 MG tablet Commonly known as: LIPITOR Take 1 tablet (40 mg total) by mouth daily.   atovaquone 750 MG/5ML suspension Commonly known as: MEPRON Take 10 mLs (1,500 mg total) by mouth daily with breakfast.   carvedilol 6.25 MG tablet Commonly known as: COREG Take 1 tablet (6.25 mg total) by mouth 2 (two) times daily with a meal.   fluconazole 200 MG tablet Commonly known as: DIFLUCAN Take 1 tablet (200 mg total) by mouth daily for 15 doses.   nicotine 14 mg/24hr patch Commonly known as: NICODERM CQ - dosed in mg/24 hours Place 1 patch (14 mg total) onto the skin daily.   sacubitril-valsartan 24-26 MG Commonly known as: ENTRESTO Take 1 tablet by mouth 2 (two) times daily.   spironolactone 25 MG tablet Commonly known as: ALDACTONE Take 0.5 tablets (12.5 mg total) by mouth daily.      Allergies  Allergen Reactions  . Lisinopril Anaphylaxis  . Remeron [Mirtazapine] Shortness Of Breath  . Abilify [Aripiprazole] Rash    Follow-up Information    Placey, Audrea Muscat, NP. Schedule an appointment as soon as possible for a visit.   Why: walk in  Contact information: [place] [address] [phone]                The results of significant diagnostics from this hospitalization (including imaging, microbiology, ancillary and laboratory) are listed below for reference.    Significant Diagnostic Studies: DG Chest 2 View  Result Date: 12/21/2020 CLINICAL DATA:  Shortness of breath and chest pain EXAM: CHEST - 2 VIEW COMPARISON:  12/06/2020 FINDINGS: Moderate cardiomegaly. No focal airspace consolidation. Mild linear opacity at the right lung base. No pleural effusion or pneumothorax. IMPRESSION: Cardiomegaly without pulmonary edema. Faint opacity at the right lung base, likely atelectasis. Electronically Signed   By: Ulyses Jarred M.D.   On:  12/21/2020 01:14   CT Angio Chest PE W and/or Wo Contrast  Result Date: 12/21/2020 CLINICAL DATA:  Shortness of breath for 3 weeks. EXAM: CT ANGIOGRAPHY CHEST WITH CONTRAST TECHNIQUE: Multidetector CT imaging of the chest was performed using the standard protocol during bolus administration of intravenous contrast. Multiplanar CT image reconstructions and MIPs were obtained to evaluate the vascular anatomy. CONTRAST:  75 mL OMNIPAQUE IOHEXOL 350 MG/ML SOLN COMPARISON:  PA and lateral chest today. Single-view of the chest 11/30/2020. FINDINGS: Cardiovascular: The study is technically good. No pulmonary embolus is identified. The patient has marked cardiomegaly. No pericardial effusion. No aneurysm. No atherosclerotic calcifications. Mediastinum/Nodes: No enlarged mediastinal, hilar, or axillary lymph nodes. Thyroid gland, trachea, and esophagus demonstrate no significant findings. Lungs/Pleura: No pleural effusion. Ground-glass attenuation is seen in the lower lobes bilaterally and appears symmetric. No nodule or mass. Upper Abdomen: Negative. Musculoskeletal: Negative. Review of the MIP images confirms the above  findings. IMPRESSION: Negative for pulmonary embolus. Bilateral lower lobe ground-glass attenuation is nonspecific and could be due to atelectasis, vascular congestion or pneumonitis. Marked cardiomegaly. Electronically Signed   By: Inge Rise M.D.   On: 12/21/2020 11:16   DG Chest Port 1 View  Result Date: 12/06/2020 CLINICAL DATA:  Cough, shortness of breath, progressively worsening, history of CHF, left-sided rales EXAM: PORTABLE CHEST 1 VIEW COMPARISON:  Radiograph 11/30/2020 FINDINGS: Fine interstitial opacities are present bilaterally with some peripheral septal thickening and mild vascular congestion. No focal consolidative opacity. No pneumothorax. No effusion. Cardiomediastinal contours are unremarkable. No acute osseous or soft tissue abnormality. Telemetry leads overlie the chest.  IMPRESSION: Features are compatible with CHF/volume overload including cardiomegaly, vascular congestion and findings most suggestive of interstitial edema in the absence of infectious symptoms. Electronically Signed   By: Lovena Le M.D.   On: 12/06/2020 03:25   DG Chest Portable 1 View  Result Date: 11/30/2020 CLINICAL DATA:  Chest pain, cough EXAM: PORTABLE CHEST 1 VIEW COMPARISON:  08/12/2020 FINDINGS: Cardiomegaly. Both lungs are clear. The visualized skeletal structures are unremarkable. IMPRESSION: Cardiomegaly without acute abnormality of the lungs in AP portable projection. Electronically Signed   By: Eddie Candle M.D.   On: 11/30/2020 10:20   ECHOCARDIOGRAM COMPLETE  Result Date: 12/22/2020    ECHOCARDIOGRAM REPORT   Patient Name:   Jane Estrada Date of Exam: 12/22/2020 Medical Rec #:  [id number]     Height:       64.0 in Accession #:    [id number]    Weight:       109.7 lb Date of Birth:  1976/10/30    BSA:          1.516 m Patient Age:    52 years      BP:           122/90 mmHg Patient Gender: F             HR:           89 bpm. Exam Location:  Inpatient Procedure: 2D Echo, 3D Echo, Cardiac Doppler and Color Doppler Indications:    CHF-Acute Systolic  History:        Patient has prior history of Echocardiogram examinations, most                 recent 03/15/2020. Signs/Symptoms:Shortness of Breath; Risk                 Factors:Current Smoker and Hypertension. Polysubstance abuse.  Sonographer:    Clayton Lefort RDCS (AE) Referring Phys: [place]  1. Left ventricular ejection fraction, by estimation, is 20%. The left ventricle has severely decreased function. The left ventricle demonstrates global hypokinesis. The left ventricular internal cavity size was severely dilated. There is mild left ventricular hypertrophy. Left ventricular diastolic parameters are consistent with Grade II diastolic dysfunction (pseudonormalization). Elevated left ventricular end-diastolic pressure.   2. Right ventricular systolic function is normal. The right ventricular size is normal.  3. Left atrial size was mildly dilated.  4. The mitral valve is normal in structure. Mild mitral valve regurgitation. No evidence of mitral stenosis.  5. The aortic valve is normal in structure. Aortic valve regurgitation is not visualized. No aortic stenosis is present.  6. The inferior vena cava is normal in size with greater than 50% respiratory variability, suggesting right atrial pressure of 3 mmHg. FINDINGS  Left Ventricle: Left ventricular ejection fraction, by estimation, is 20%. The left ventricle has  severely decreased function. The left ventricle demonstrates global hypokinesis. The left ventricular internal cavity size was severely dilated. There is mild left ventricular hypertrophy. Left ventricular diastolic parameters are consistent with Grade II diastolic dysfunction (pseudonormalization). Elevated left ventricular end-diastolic pressure. Right Ventricle: The right ventricular size is normal. No increase in right ventricular wall thickness. Right ventricular systolic function is normal. Left Atrium: Left atrial size was mildly dilated. Right Atrium: Right atrial size was normal in size. Pericardium: Trivial pericardial effusion is present. Mitral Valve: The mitral valve is normal in structure. Mild mitral valve regurgitation. No evidence of mitral valve stenosis. Tricuspid Valve: The tricuspid valve is normal in structure. Tricuspid valve regurgitation is mild . No evidence of tricuspid stenosis. Aortic Valve: The aortic valve is normal in structure. Aortic valve regurgitation is not visualized. No aortic stenosis is present. Aortic valve mean gradient measures 1.0 mmHg. Aortic valve peak gradient measures 1.8 mmHg. Aortic valve area, by VTI measures 3.36 cm. Pulmonic Valve: The pulmonic valve was normal in structure. Pulmonic valve regurgitation is not visualized. No evidence of pulmonic stenosis. Aorta: The  aortic root is normal in size and structure. Venous: The inferior vena cava is normal in size with greater than 50% respiratory variability, suggesting right atrial pressure of 3 mmHg. IAS/Shunts: No atrial level shunt detected by color flow Doppler.  LEFT VENTRICLE PLAX 2D LVIDd:         5.80 cm  Diastology LVIDs:         5.20 cm  LV e' medial:    5.02 cm/s LV PW:         1.40 cm  LV E/e' medial:  14.2 LV IVS:        1.40 cm  LV e' lateral:   4.88 cm/s LVOT diam:     2.30 cm  LV E/e' lateral: 14.6 LV SV:         32 LV SV Index:   21 LVOT Area:     4.15 cm  RIGHT VENTRICLE            IVC RV Basal diam:  3.50 cm    IVC diam: 1.40 cm RV S prime:     7.01 cm/s TAPSE (M-mode): 1.7 cm LEFT ATRIUM             Index       RIGHT ATRIUM           Index LA diam:        3.90 cm 2.57 cm/m  RA Area:     15.70 cm LA Vol (A2C):   98.1 ml 64.72 ml/m RA Volume:   46.60 ml  30.75 ml/m LA Vol (A4C):   79.1 ml 52.19 ml/m LA Biplane Vol: 88.9 ml 58.65 ml/m  AORTIC VALVE AV Area (Vmax):    3.08 cm AV Area (Vmean):   2.76 cm AV Area (VTI):     3.36 cm AV Vmax:           68.00 cm/s AV Vmean:          53.000 cm/s AV VTI:            0.094 m AV Peak Grad:      1.8 mmHg AV Mean Grad:      1.0 mmHg LVOT Vmax:         50.40 cm/s LVOT Vmean:        35.200 cm/s LVOT VTI:          0.076 m LVOT/AV VTI  ratio: 0.81  AORTA Ao Root diam: 3.10 cm Ao Asc diam:  2.70 cm MITRAL VALVE MV Area (PHT): 2.87 cm    SHUNTS MV Decel Time: 264 msec    Systemic VTI:  0.08 m MV E velocity: 71.10 cm/s  Systemic Diam: 2.30 cm Jenkins Rouge MD Electronically signed by Jenkins Rouge MD Signature Date/Time: 12/22/2020/12:18:13 PM    Final    DG HIP UNILAT WITH PELVIS 2-3 VIEWS RIGHT  Result Date: 12/24/2020 CLINICAL DATA:  Right hip pain for 2 days. EXAM: DG HIP (WITH OR WITHOUT PELVIS) 2-3V RIGHT COMPARISON:  None. FINDINGS: There is no evidence of hip fracture or dislocation. There is no evidence of arthropathy or other focal bone abnormality. IMPRESSION:  Negative. Electronically Signed   By: Misty Stanley M.D.   On: 12/24/2020 13:45    Microbiology: Recent Results (from the past 240 hour(s))  Resp Panel by RT-PCR (Flu A&B, Covid) Nasopharyngeal Swab     Status: None   Collection Time: 12/21/20 12:32 PM   Specimen: Nasopharyngeal Swab; Nasopharyngeal(NP) swabs in vial transport medium  Result Value Ref Range Status   SARS Coronavirus 2 by RT PCR NEGATIVE NEGATIVE Final    Comment: (NOTE) SARS-CoV-2 target nucleic acids are NOT DETECTED.  The SARS-CoV-2 RNA is generally detectable in upper respiratory specimens during the acute phase of infection. The lowest concentration of SARS-CoV-2 viral copies this assay can detect is 138 copies/mL. A negative result does not preclude SARS-Cov-2 infection and should not be used as the sole basis for treatment or other patient management decisions. A negative result may occur with  improper specimen collection/handling, submission of specimen other than nasopharyngeal swab, presence of viral mutation(s) within the areas targeted by this assay, and inadequate number of viral copies(<138 copies/mL). A negative result must be combined with clinical observations, patient history, and epidemiological information. The expected result is Negative.  Fact Sheet for Patients:  EntrepreneurPulse.com.au  Fact Sheet for Healthcare Providers:  IncredibleEmployment.be  This test is no t yet approved or cleared by the [place] FDA and  has been authorized for detection and/or diagnosis of SARS-CoV-2 by FDA under an Emergency Use Authorization (EUA). This EUA will remain  in effect (meaning this test can be used) for the duration of the COVID-19 declaration under Section 564(b)(1) of the Act, 21 U.S.C.section 360bbb-3(b)(1), unless the authorization is terminated  or revoked sooner.       Influenza A by PCR NEGATIVE NEGATIVE Final   Influenza B by PCR NEGATIVE  NEGATIVE Final    Comment: (NOTE) The Xpert Xpress SARS-CoV-2/FLU/RSV plus assay is intended as an aid in the diagnosis of influenza from Nasopharyngeal swab specimens and should not be used as a sole basis for treatment. Nasal washings and aspirates are unacceptable for Xpert Xpress SARS-CoV-2/FLU/RSV testing.  Fact Sheet for Patients: EntrepreneurPulse.com.au  Fact Sheet for Healthcare Providers: IncredibleEmployment.be  This test is not yet approved or cleared by the [place] FDA and has been authorized for detection and/or diagnosis of SARS-CoV-2 by FDA under an Emergency Use Authorization (EUA). This EUA will remain in effect (meaning this test can be used) for the duration of the COVID-19 declaration under Section 564(b)(1) of the Act, 21 U.S.C. section 360bbb-3(b)(1), unless the authorization is terminated or revoked.  Performed at [facility], [place] [address] [place], [place] [address]      Labs: Basic Metabolic Panel: Recent Labs  Lab 12/23/20 0325 12/24/20 0356 12/25/20 0033 12/27/20 0330 12/29/20 0241  NA  134* 136 134* 136 136  K 3.4* 4.2 3.7 4.1 4.6  CL 99 100 98 102 104  CO2 24 25 25 25 25   GLUCOSE 102* 110* 112* 104* 98  BUN 25* 24* 26* 23* 20  CREATININE 1.15* 1.20* 1.06* 1.12* 1.06*  CALCIUM 8.7* 8.7* 9.0 9.1 9.2   Liver Function Tests: No results for input(s): AST, ALT, ALKPHOS, BILITOT, PROT, ALBUMIN in the last 168 hours. No results for input(s): LIPASE, AMYLASE in the last 168 hours. No results for input(s): AMMONIA in the last 168 hours. CBC: Recent Labs  Lab 12/25/20 0033 12/27/20 0330  WBC 1.5* 1.1*  HGB 10.4* 9.9*  HCT 34.8* 32.2*  MCV 90.2 89.4  PLT 117* 110*   Cardiac Enzymes: No results for input(s): CKTOTAL, CKMB, CKMBINDEX, TROPONINI in the last 168 hours. BNP: BNP (last 3 results) Recent Labs    12/06/20 0349 12/21/20 0909  BNP 788.9* 1,223.5*    ProBNP (last 3  results) No results for input(s): PROBNP in the last 8760 hours.  CBG: No results for input(s): GLUCAP in the last 168 hours.     Signed:  Kayleen Memos, MD [facility] 12/29/2020, 2:18 PM

## 2020-12-29 NOTE — Progress Notes (Signed)
Initial Nutrition Assessment  DOCUMENTATION CODES:   Not applicable  INTERVENTION:  Provide Ensure Enlive po BID, each supplement provides 350 kcal and 20 grams of protein  Encourage adequate PO intake.   NUTRITION DIAGNOSIS:   Increased nutrient needs related to chronic illness (CHF, HIV) as evidenced by estimated needs.  GOAL:   Patient will meet greater than or equal to 90% of their needs  MONITOR:   PO intake,Supplement acceptance,Skin,Weight trends,Labs,I & O's  REASON FOR ASSESSMENT:   Malnutrition Screening Tool    ASSESSMENT:   45 y.o. female with medical history significant of CAD; HIV; HTN; bipolar; granular cell tumor of the trachea; polysubstance abuse; homelessness; and chronic systolic CHF presenting with chest pain. CXR consistent with mild pulmonary edema/Elevated BNP, given lasix.   Meal completion has been 100%. Pt reports having a good appetite. Pt does endorse possibly leaving AMA and stating she no longer would like to stay inpatient. Unable to complete Nutrition-Focused physical exam at this time. Pt reports homelessness, however refuses shelter. RD to order nutritional supplements to aid in caloric and protein needs.   Labs and medications reviewed.   Diet Order:   Diet Order            Diet Heart Room service appropriate? Yes; Fluid consistency: Thin; Fluid restriction: 1500 mL Fluid  Diet effective now                 EDUCATION NEEDS:   Not appropriate for education at this time  Skin:  Skin Assessment: Reviewed RN Assessment  Last BM:  1/18  Height:   Ht Readings from Last 1 Encounters:  12/21/20 5\' 4"  (1.626 m)    Weight:   Wt Readings from Last 1 Encounters:  12/29/20 49.1 kg    Ideal Body Weight:  54.54 kg  BMI:  Body mass index is 18.57 kg/m.  Estimated Nutritional Needs:   Kcal:  1700-1900  Protein:  80-95 grams  Fluid:  1.5 L/day  Corrin Parker, MS, RD, LDN RD pager number/after hours weekend pager  number on Amion.

## 2020-12-29 NOTE — Care Management (Signed)
Patient says she gets her prescriptions through [facility] on [address] for free. I called and confirmed . For the Mepron they can only give 300 ml at a time but it will be free of charge.   Secure chatted MD asked if script can be sent to [place] RN

## 2020-12-29 NOTE — Progress Notes (Addendum)
Cardiology at bedside. Pt insist on going home as per earlier md order. Pt calmly expressing frustration and lack of  understanding why she was not seen by a cardiologist during the course of her admission.

## 2020-12-29 NOTE — Progress Notes (Addendum)
Heart Failure Stewardship Pharmacist Progress Note   PCP: Placey, Audrea Muscat, NP PCP-Cardiologist: No primary care provider on file.    HPI:  45 YO female with PMH of CAD, CHF, HIV, HTN, polysubstance abuse, and homelessness. Presented to ED with trouble breathing and chest pain. Patient has not been adherence to home medications. Description consistent with severe orthopnea. ECHO completed on 12/22/20 showing an EF of 20%.   Current HF Medications: Carvedilol 6.25 mg BID Entresto 24/26 mg BID Spironolactone 12.5 mg daily  Prior to admission HF Medications: *Not taking these medications, but on PTA list Furosemide 20 mg daily x 5 days Metoprolol tartrate 12.5 mg BID  Hydralazine 25 mg TID  Pertinent Lab Values: . Serum creatinine 1.06, BUN 20, Potassium 4.6, Sodium 136, BNP 789  Vital Signs: . Weight: 108 lbs (admission weight: 143 lbs) . Blood pressure: 130/90s . Heart rate: 60s   Medication Assistance / Insurance Benefits Check: Does the patient have prescription insurance?  Yes Type of insurance plan: [place] Medicaid (working on transitioning to [facility])  Outpatient Pharmacy:  [facility] Is the patient willing to use [facility] at discharge? No - she [facility] her [facility]  Is the patient willing to transition their outpatient pharmacy to utilize a [facility] outpatient pharmacy?   No - same as above    Assessment: 1. Acute on chronic systolic CHF (EF 62%), with multifactoral etiolgy and medication noncompliance. NYHA class II symptoms. - Continue carvedilol 6.25 mg BID - Continue Entresto 24/26 mg BID - Continue spironolactone 12.5 mg daily - Consider adding Wilder Glade / Jardiance as an outpatient after assessing medication compliance with current regimen  2. Medication noncompliance - [place] Medicaid pending; currently has active [facility] Medicaid - She is willing to start taking medications for HF but is  refusing to take any HIV medications even after education on therapy. She states that in the past she got sick after taking HIV meds and is no longer interested in discussing therapy options.  - [facility] and she has only picked up Bactrim and nystatin (all other meds were on hold) so she has been off chronic therapy for an unknown amount of time. - Per EPIC dispense record, she has last filled lasix on 09/06/20 x 30 day supply.   Plan: 1) Medication changes recommended at this time: - Continue current regimen; discharge possibly today or tomorrow  2) Patient assistance / nonadherence: - Consulted HF RN navigator - appreciate assistance - Entresto long-term patient assistance to be provided from [facility] (PCP clinic) - Primary team will arrange outpatient cardiology follow up. Patient may need [facility] arranged for appointments if her friends are unable to provide transportation.    3)  Education  - Patient has been educated on current HF medications (Entresto, carvedilol, spironolactone) and potential additions to HF medication regimen (Farxiga) - Patient verbalizes understanding that over the next few months, these medication doses may change and more medications may be added to optimize HF regimen - Patient has been educated on basic disease state pathophysiology and goals of therapy - Patient provided with weekly pill container for improving compliance to medications - Time spent (60 mins)   Kerby Nora, PharmD, BCPS Heart Failure Stewardship Pharmacist Phone [phone]

## 2021-01-04 ENCOUNTER — Inpatient Hospital Stay: Payer: Medicaid - Out of State | Admitting: Internal Medicine

## 2021-01-21 ENCOUNTER — Encounter (HOSPITAL_COMMUNITY): Payer: Self-pay

## 2021-01-21 ENCOUNTER — Other Ambulatory Visit: Payer: Self-pay

## 2021-01-21 ENCOUNTER — Emergency Department (HOSPITAL_COMMUNITY): Payer: Medicaid - Out of State

## 2021-01-21 ENCOUNTER — Inpatient Hospital Stay (HOSPITAL_COMMUNITY)
Admission: EM | Admit: 2021-01-21 | Discharge: 2021-01-27 | DRG: 291 | Disposition: A | Discharge status: Home / Self Care | Payer: Medicaid - Out of State | Attending: Internal Medicine | Admitting: Internal Medicine

## 2021-01-21 DIAGNOSIS — Z9114 Patient's other noncompliance with medication regimen: Secondary | ICD-10-CM

## 2021-01-21 DIAGNOSIS — Z66 Do not resuscitate: Secondary | ICD-10-CM | POA: Diagnosis present

## 2021-01-21 DIAGNOSIS — E872 Acidosis: Secondary | ICD-10-CM | POA: Diagnosis present

## 2021-01-21 DIAGNOSIS — I252 Old myocardial infarction: Secondary | ICD-10-CM

## 2021-01-21 DIAGNOSIS — I5043 Acute on chronic combined systolic (congestive) and diastolic (congestive) heart failure: Secondary | ICD-10-CM | POA: Diagnosis present

## 2021-01-21 DIAGNOSIS — J4521 Mild intermittent asthma with (acute) exacerbation: Secondary | ICD-10-CM | POA: Diagnosis present

## 2021-01-21 DIAGNOSIS — I169 Hypertensive crisis, unspecified: Secondary | ICD-10-CM | POA: Diagnosis present

## 2021-01-21 DIAGNOSIS — I5022 Chronic systolic (congestive) heart failure: Secondary | ICD-10-CM

## 2021-01-21 DIAGNOSIS — R1013 Epigastric pain: Secondary | ICD-10-CM | POA: Diagnosis present

## 2021-01-21 DIAGNOSIS — Z79899 Other long term (current) drug therapy: Secondary | ICD-10-CM | POA: Diagnosis not present

## 2021-01-21 DIAGNOSIS — K5900 Constipation, unspecified: Secondary | ICD-10-CM | POA: Diagnosis present

## 2021-01-21 DIAGNOSIS — F319 Bipolar disorder, unspecified: Secondary | ICD-10-CM | POA: Diagnosis present

## 2021-01-21 DIAGNOSIS — Z20822 Contact with and (suspected) exposure to covid-19: Secondary | ICD-10-CM | POA: Diagnosis present

## 2021-01-21 DIAGNOSIS — F17210 Nicotine dependence, cigarettes, uncomplicated: Secondary | ICD-10-CM | POA: Diagnosis present

## 2021-01-21 DIAGNOSIS — I509 Heart failure, unspecified: Secondary | ICD-10-CM

## 2021-01-21 DIAGNOSIS — I5041 Acute combined systolic (congestive) and diastolic (congestive) heart failure: Secondary | ICD-10-CM

## 2021-01-21 DIAGNOSIS — D61818 Other pancytopenia: Secondary | ICD-10-CM | POA: Diagnosis not present

## 2021-01-21 DIAGNOSIS — J9601 Acute respiratory failure with hypoxia: Secondary | ICD-10-CM | POA: Diagnosis present

## 2021-01-21 DIAGNOSIS — Z9119 Patient's noncompliance with other medical treatment and regimen: Secondary | ICD-10-CM

## 2021-01-21 DIAGNOSIS — B20 Human immunodeficiency virus [HIV] disease: Secondary | ICD-10-CM

## 2021-01-21 DIAGNOSIS — Z5900 Homelessness unspecified: Secondary | ICD-10-CM

## 2021-01-21 DIAGNOSIS — I130 Hypertensive heart and chronic kidney disease with heart failure and stage 1 through stage 4 chronic kidney disease, or unspecified chronic kidney disease: Secondary | ICD-10-CM | POA: Diagnosis present

## 2021-01-21 DIAGNOSIS — Z7982 Long term (current) use of aspirin: Secondary | ICD-10-CM

## 2021-01-21 DIAGNOSIS — N182 Chronic kidney disease, stage 2 (mild): Secondary | ICD-10-CM | POA: Diagnosis present

## 2021-01-21 DIAGNOSIS — Z681 Body mass index (BMI) 19 or less, adult: Secondary | ICD-10-CM | POA: Diagnosis not present

## 2021-01-21 DIAGNOSIS — Z91199 Patient's noncompliance with other medical treatment and regimen due to unspecified reason: Secondary | ICD-10-CM

## 2021-01-21 DIAGNOSIS — J810 Acute pulmonary edema: Secondary | ICD-10-CM | POA: Diagnosis not present

## 2021-01-21 DIAGNOSIS — R109 Unspecified abdominal pain: Secondary | ICD-10-CM

## 2021-01-21 DIAGNOSIS — E876 Hypokalemia: Secondary | ICD-10-CM | POA: Diagnosis not present

## 2021-01-21 DIAGNOSIS — E43 Unspecified severe protein-calorie malnutrition: Secondary | ICD-10-CM | POA: Diagnosis present

## 2021-01-21 DIAGNOSIS — N179 Acute kidney failure, unspecified: Secondary | ICD-10-CM | POA: Diagnosis present

## 2021-01-21 DIAGNOSIS — R0600 Dyspnea, unspecified: Secondary | ICD-10-CM

## 2021-01-21 DIAGNOSIS — R0602 Shortness of breath: Secondary | ICD-10-CM

## 2021-01-21 DIAGNOSIS — Z888 Allergy status to other drugs, medicaments and biological substances status: Secondary | ICD-10-CM

## 2021-01-21 DIAGNOSIS — L8990 Pressure ulcer of unspecified site, unspecified stage: Secondary | ICD-10-CM | POA: Insufficient documentation

## 2021-01-21 DIAGNOSIS — Z8249 Family history of ischemic heart disease and other diseases of the circulatory system: Secondary | ICD-10-CM

## 2021-01-21 LAB — BASIC METABOLIC PANEL
Anion gap: 12 (ref 5–15)
BUN: 25 mg/dL — ABNORMAL HIGH (ref 6–20)
CO2: 19 mmol/L — ABNORMAL LOW (ref 22–32)
Calcium: 9 mg/dL (ref 8.9–10.3)
Chloride: 107 mmol/L (ref 98–111)
Creatinine, Ser: 1.18 mg/dL — ABNORMAL HIGH (ref 0.44–1.00)
GFR, Estimated: 59 mL/min — ABNORMAL LOW (ref >60)
Glucose, Bld: 112 mg/dL — ABNORMAL HIGH (ref 70–99)
Potassium: 4.2 mmol/L (ref 3.5–5.1)
Sodium: 138 mmol/L (ref 135–145)

## 2021-01-21 LAB — BLOOD GAS, VENOUS
Acid-Base Excess: 1.2 mmol/L (ref 0.0–2.0)
Bicarbonate: 26 mmol/L (ref 20.0–28.0)
Drawn by: 5790
FIO2: 21
O2 Saturation: 20.9 %
Patient temperature: 36.3
pCO2, Ven: 44.7 mmHg (ref 44.0–60.0)
pH, Ven: 7.378 (ref 7.250–7.430)
pO2, Ven: <31 mmHg — CL (ref 32.0–45.0)

## 2021-01-21 LAB — HEPATIC FUNCTION PANEL
ALT: 16 U/L (ref 0–44)
AST: 35 U/L (ref 15–41)
Albumin: 3.6 g/dL (ref 3.5–5.0)
Alkaline Phosphatase: 38 U/L (ref 38–126)
Bilirubin, Direct: 0.2 mg/dL (ref 0.0–0.2)
Indirect Bilirubin: 0.9 mg/dL (ref 0.3–0.9)
Total Bilirubin: 1.1 mg/dL (ref 0.3–1.2)
Total Protein: 7.1 g/dL (ref 6.5–8.1)

## 2021-01-21 LAB — IRON AND TIBC
Iron: 60 ug/dL (ref 28–170)
Saturation Ratios: 13 % (ref 10.4–31.8)
TIBC: 459 ug/dL — ABNORMAL HIGH (ref 250–450)
UIBC: 399 ug/dL

## 2021-01-21 LAB — CBC WITH DIFFERENTIAL/PLATELET
Abs Immature Granulocytes: 0.01 10*3/uL (ref 0.00–0.07)
Basophils Absolute: 0 10*3/uL (ref 0.0–0.1)
Basophils Relative: 0 %
Eosinophils Absolute: 0 10*3/uL (ref 0.0–0.5)
Eosinophils Relative: 0 %
HCT: 27.6 % — ABNORMAL LOW (ref 36.0–46.0)
Hemoglobin: 8.6 g/dL — ABNORMAL LOW (ref 12.0–15.0)
Immature Granulocytes: 1 %
Lymphocytes Relative: 13 %
Lymphs Abs: 0.2 10*3/uL — ABNORMAL LOW (ref 0.7–4.0)
MCH: 27.2 pg (ref 26.0–34.0)
MCHC: 31.2 g/dL (ref 30.0–36.0)
MCV: 87.3 fL (ref 80.0–100.0)
Monocytes Absolute: 0.2 10*3/uL (ref 0.1–1.0)
Monocytes Relative: 9 %
Neutro Abs: 1.4 10*3/uL — ABNORMAL LOW (ref 1.7–7.7)
Neutrophils Relative %: 77 %
Platelets: 106 10*3/uL — ABNORMAL LOW (ref 150–400)
RBC: 3.16 MIL/uL — ABNORMAL LOW (ref 3.87–5.11)
RDW: 15.8 % — ABNORMAL HIGH (ref 11.5–15.5)
WBC: 1.9 10*3/uL — ABNORMAL LOW (ref 4.0–10.5)
nRBC: 0 % (ref 0.0–0.2)

## 2021-01-21 LAB — TROPONIN I (HIGH SENSITIVITY)
Troponin I (High Sensitivity): 63 ng/L — ABNORMAL HIGH (ref <18)
Troponin I (High Sensitivity): 64 ng/L — ABNORMAL HIGH (ref <18)

## 2021-01-21 LAB — RESP PANEL BY RT-PCR (FLU A&B, COVID) ARPGX2
Influenza A by PCR: NEGATIVE
Influenza B by PCR: NEGATIVE
SARS Coronavirus 2 by RT PCR: NEGATIVE

## 2021-01-21 LAB — LIPASE, BLOOD: Lipase: 37 U/L (ref 11–51)

## 2021-01-21 LAB — FERRITIN: Ferritin: 26 ng/mL (ref 11–307)

## 2021-01-21 LAB — BRAIN NATRIURETIC PEPTIDE: B Natriuretic Peptide: 3795.6 pg/mL — ABNORMAL HIGH (ref 0.0–100.0)

## 2021-01-21 MED ORDER — LABETALOL HCL 5 MG/ML IV SOLN
10.0000 mg | Freq: Four times a day (QID) | INTRAVENOUS | Status: DC | PRN
  Administered 2021-01-21 – 2021-01-24 (×2): 10 mg via INTRAVENOUS
  Filled 2021-01-21 (×2): qty 4

## 2021-01-21 MED ORDER — SACUBITRIL-VALSARTAN 24-26 MG PO TABS
1.0000 | ORAL_TABLET | Freq: Two times a day (BID) | ORAL | Status: DC
  Filled 2021-01-21: qty 1

## 2021-01-21 MED ORDER — ACETAMINOPHEN 325 MG PO TABS
650.0000 mg | ORAL_TABLET | ORAL | Status: DC | PRN
  Administered 2021-01-21 – 2021-01-24 (×5): 650 mg via ORAL
  Filled 2021-01-21 (×5): qty 2

## 2021-01-21 MED ORDER — CARVEDILOL 12.5 MG PO TABS
12.5000 mg | ORAL_TABLET | Freq: Two times a day (BID) | ORAL | Status: DC
  Administered 2021-01-21: 12.5 mg via ORAL
  Filled 2021-01-21 (×2): qty 4

## 2021-01-21 MED ORDER — MORPHINE SULFATE (PF) 4 MG/ML IV SOLN
4.0000 mg | Freq: Once | INTRAVENOUS | Status: AC
Start: 2021-01-21 — End: 2021-01-21
  Administered 2021-01-21: 4 mg via INTRAVENOUS
  Filled 2021-01-21: qty 1

## 2021-01-21 MED ORDER — ASPIRIN EC 81 MG PO TBEC
81.0000 mg | DELAYED_RELEASE_TABLET | Freq: Every day | ORAL | Status: DC
  Administered 2021-01-21 – 2021-01-27 (×7): 81 mg via ORAL
  Filled 2021-01-21 (×8): qty 1

## 2021-01-21 MED ORDER — SODIUM CHLORIDE 0.9% FLUSH: 3.0000 mL | INTRAVENOUS | Status: DC | PRN

## 2021-01-21 MED ORDER — ATORVASTATIN CALCIUM 40 MG PO TABS
40.0000 mg | ORAL_TABLET | Freq: Every day | ORAL | Status: DC
  Filled 2021-01-21 (×3): qty 1
  Filled 2021-01-21: qty 4

## 2021-01-21 MED ORDER — FUROSEMIDE 10 MG/ML IJ SOLN
40.0000 mg | Freq: Two times a day (BID) | INTRAMUSCULAR | Status: DC
  Administered 2021-01-21 – 2021-01-24 (×6): 40 mg via INTRAVENOUS
  Filled 2021-01-21 (×6): qty 4

## 2021-01-21 MED ORDER — ONDANSETRON HCL 4 MG/2ML IJ SOLN
4.0000 mg | Freq: Four times a day (QID) | INTRAMUSCULAR | Status: DC | PRN
  Filled 2021-01-21: qty 2

## 2021-01-21 MED ORDER — FUROSEMIDE 10 MG/ML IJ SOLN
40.0000 mg | Freq: Once | INTRAMUSCULAR | Status: AC
  Administered 2021-01-21: 40 mg via INTRAVENOUS
  Filled 2021-01-21: qty 4

## 2021-01-21 MED ORDER — SODIUM CHLORIDE 0.9% FLUSH
3.0000 mL | Freq: Two times a day (BID) | INTRAVENOUS | Status: DC
  Administered 2021-01-21 – 2021-01-27 (×11): 3 mL via INTRAVENOUS

## 2021-01-21 MED ORDER — SODIUM CHLORIDE 0.9 % IV SOLN: 250.0000 mL | INTRAVENOUS | Status: DC | PRN

## 2021-01-21 MED ORDER — NITROGLYCERIN 0.4 MG SL SUBL: 0.4000 mg | SUBLINGUAL_TABLET | SUBLINGUAL | Status: DC | PRN

## 2021-01-21 MED ORDER — ENSURE ENLIVE PO LIQD
237.0000 mL | Freq: Two times a day (BID) | ORAL | Status: DC
  Administered 2021-01-21 – 2021-01-23 (×3): 237 mL via ORAL
  Filled 2021-01-21: qty 237

## 2021-01-21 MED ORDER — ENOXAPARIN SODIUM 40 MG/0.4ML ~~LOC~~ SOLN
40.0000 mg | SUBCUTANEOUS | Status: DC
  Administered 2021-01-21 – 2021-01-26 (×6): 40 mg via SUBCUTANEOUS
  Filled 2021-01-21 (×6): qty 0.4

## 2021-01-21 MED ORDER — NICOTINE 14 MG/24HR TD PT24
14.0000 mg | MEDICATED_PATCH | Freq: Every day | TRANSDERMAL | Status: DC
  Administered 2021-01-21 – 2021-01-26 (×6): 14 mg via TRANSDERMAL
  Filled 2021-01-21 (×6): qty 1

## 2021-01-21 MED ORDER — IPRATROPIUM-ALBUTEROL 0.5-2.5 (3) MG/3ML IN SOLN
3.0000 mL | Freq: Four times a day (QID) | RESPIRATORY_TRACT | Status: DC | PRN
  Administered 2021-01-21 – 2021-01-26 (×12): 3 mL via RESPIRATORY_TRACT
  Filled 2021-01-21 (×12): qty 3

## 2021-01-21 MED ORDER — ATOVAQUONE 750 MG/5ML PO SUSP
1500.0000 mg | Freq: Every day | ORAL | Status: DC
  Filled 2021-01-21 (×6): qty 10

## 2021-01-21 MED ORDER — GUAIFENESIN ER 600 MG PO TB12
600.0000 mg | ORAL_TABLET | Freq: Two times a day (BID) | ORAL | Status: DC
  Administered 2021-01-21 – 2021-01-27 (×13): 600 mg via ORAL
  Filled 2021-01-21 (×13): qty 1

## 2021-01-21 MED ORDER — LABETALOL HCL 200 MG PO TABS
100.0000 mg | ORAL_TABLET | Freq: Four times a day (QID) | ORAL | Status: DC | PRN
Start: 2021-01-21 — End: 2021-01-21

## 2021-01-21 NOTE — ED Notes (Signed)
Dinner Tray Ordered @ 1710.
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
## 2021-01-21 NOTE — ED Notes (Signed)
Patient transported to CT 

## 2021-01-21 NOTE — Progress Notes (Signed)
CRITICAL VALUE ALERT  Critical Value:  PO2<31.0 Date & Time Notied: 01/21/21 @ 2152  Provider Notified: Opyd  Orders Received/Actions taken:

## 2021-01-21 NOTE — ED Provider Notes (Signed)
[facility] EMERGENCY DEPARTMENT Provider Note   CSN: [id number] Arrival date & time: 01/21/21  1016     History Chief Complaint  Patient presents with  . Shortness of Breath  . Abdominal Pain    Jane Estrada is a 45 y.o. female w/ hx of HIV/AIDS (not taking antivirals, CD4 < 35 on 12/21/20), severe CHF (EF < 20% on 12/22/20 echo), hypertension, bipolar disorder presenting from hotel with concern for dyspnea and abdominal pain.  Patient staying at [facility] with homelessness program.  She reports she has chronic dypsnea but it worsened in the past 2-3 days, and reports significant social stressors and agitation related to arguments with a room mate at the hotel.  She feels she cannot catch her breath now.  She has orthopnea chronically.  Denies leg swelling.  She reports abdominal discomfort that travels to her back, that is constant and severe.  She reports nausea and diarrhea, but no vomiting.    Per record review, patient was most recently hospitalized from 1/10/-12/29/20.  She was started on multiple medications for BP, HIV and heart failure, but she tells me she stopped taking all of these medications out of concern for their side effects.  She is upset no cardiology appointment was made for her at the time of discharge, and that she was told she would need to make an appointment herself.    For her heart failure she was seen in consultation by Dr Jeffie Pollock from cardiology, who had recommended additional inpatient testing including cMRI and titration of GDMT, but the patient had been wanting to leave the hospital and refused to stay for further testing.  She did receive lasix diuresis in the hospital and had transient hypotension noted to be 2/2 over-diuresis.  She was prescribed coreg 6.25 BID, entresto 24-26 BID, and spironolactone 12.5 mg daily.  She was advised to f/u with ID for her HIV in the clinic but did not.  She was started on atovaquone as PCP prophylaxis but  is not taking this medication.  *  The patient separately reports to me she does NOT want any blood products, and she is DNR/DNI.  HPI     Past Medical History:  Diagnosis Date  . Bipolar affective ([place])   . Chronic systolic CHF (congestive heart failure) ([place])   . Granular cell tumor   . HIV (human immunodeficiency virus infection) ([place])   . Hypertension   . Myocardial infarct ([place]) 2006  . Tobacco dependence     Patient Active Problem List   Diagnosis Date Noted  . CHF (congestive heart failure) ([place]) 01/21/2021  . Shortness of breath 12/21/2020  . Thrush 12/21/2020  . Need for pneumocystis prophylaxis 12/21/2020  . Heart failure ([place])   . Chest pain 03/15/2020  . Polysubstance abuse ([place]) 03/15/2020  . Pancytopenia ([place]) 03/15/2020  . 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 Tobacco dependence   . Hypertension   . HIV (human immunodeficiency virus infection) ([place])   . Granular cell tumor   . Chronic systolic CHF (congestive heart failure) ([place])   . Bipolar affective ([place])   . History of myocardial infarction 2006    Past Surgical History:  Procedure Laterality Date  . none       OB History   No obstetric history on file.     Family History  Problem Relation Age of Onset  . Hypertension Mother   . Cancer Father     Social History   Tobacco Use  .  Smoking status: Current Every Day Smoker    Packs/day: 0.50    Years: 30.00    Pack years: 15.00    Types: Cigarettes  . Smokeless tobacco: Never Used  Vaping Use  . Vaping Use: Never used  Substance Use Topics  . Alcohol use: Not Currently    Comment: last drink in 02/2020  . Drug use: Yes    Frequency: 1.0 times per week    Types: Marijuana, "Crack" cocaine    Comment: daily marijuana use; denies using other drugs but previously used crack    Home Medications Prior to Admission medications   Medication Sig Start Date End Date Taking? Authorizing Provider  atorvastatin  (LIPITOR) 40 MG tablet Take 1 tablet (40 mg total) by mouth daily. 12/29/20 03/29/21 Yes Kayleen Memos, DO  atovaquone (MEPRON) 750 MG/5ML suspension Take 10 mLs (1,500 mg total) by mouth daily with breakfast. 12/29/20 03/29/21 Yes Kayleen Memos, DO  carvedilol (COREG) 6.25 MG tablet Take 1 tablet (6.25 mg total) by mouth 2 (two) times daily with a meal. 12/29/20 03/29/21 Yes Hall, Carole N, DO  ibuprofen (ADVIL) 200 MG tablet Take 400 mg by mouth daily as needed for headache.   Yes [provider]  sacubitril-valsartan (ENTRESTO) 24-26 MG Take 1 tablet by mouth 2 (two) times daily. 12/29/20 03/29/21 Yes Kayleen Memos, DO  spironolactone (ALDACTONE) 25 MG tablet Take 0.5 tablets (12.5 mg total) by mouth daily. 12/29/20 03/29/21 Yes Kayleen Memos, DO  aspirin 81 MG EC tablet Take 1 tablet (81 mg total) by mouth daily. Swallow whole. 12/29/20 12/24/21  Kayleen Memos, DO  nicotine (NICODERM CQ - DOSED IN MG/24 HOURS) 14 mg/24hr patch Place 1 patch (14 mg total) onto the skin daily. Patient not taking: Reported on 01/21/2021 12/29/20   Kayleen Memos, DO    Allergies    Lisinopril, Remeron [mirtazapine], and Abilify [aripiprazole]  Review of Systems   Review of Systems  Constitutional: Negative for chills and fever.  Eyes: Negative for pain and visual disturbance.  Respiratory: Positive for cough and shortness of breath.   Cardiovascular: Positive for chest pain. Negative for leg swelling.  Gastrointestinal: Positive for abdominal pain, diarrhea and nausea. Negative for vomiting.  Genitourinary: Negative for dysuria and hematuria.  Musculoskeletal: Positive for arthralgias and myalgias. Negative for back pain.  Skin: Negative for color change and rash.  Neurological: Negative for syncope and headaches.  All other systems reviewed and are negative.   Physical Exam Updated Vital Signs BP (!) 152/114   Pulse 99   Temp 98.2 F (36.8 C) (Oral)   Resp (!) 23   Ht 5\' 4"  (1.626 m)   Wt 49 kg    SpO2 100%   BMI 18.54 kg/m   Physical Exam Constitutional:      Comments: Thin, chronically ill appearing  HENT:     Head: Normocephalic and atraumatic.  Eyes:     Conjunctiva/sclera: Conjunctivae normal.     Pupils: Pupils are equal, round, and reactive to light.  Cardiovascular:     Rate and Rhythm: Regular rhythm. Tachycardia present.  Pulmonary:     Comments: 88-90% on room air Breathing heavily but speaking in full sentences, no retractions Fine crackles diffusely on exam Abdominal:     General: There is no distension.     Tenderness: There is abdominal tenderness in the epigastric area. There is no guarding or rebound.  Musculoskeletal:     Comments: Trace symmetrical bilateral pitting edema  Skin:    General: Skin is warm and dry.  Neurological:     General: No focal deficit present.     Mental Status: She is alert and oriented to person, place, and time. Mental status is at baseline.  Psychiatric:        Mood and Affect: Mood normal.        Behavior: Behavior normal.     ED Results / Procedures / Treatments   Labs (all labs ordered are listed, but only abnormal results are displayed) Labs Reviewed  BASIC METABOLIC PANEL - Abnormal; Notable for the following components:      Result Value   CO2 19 (*)    Glucose, Bld 112 (*)    BUN 25 (*)    Creatinine, Ser 1.18 (*)    GFR, Estimated 59 (*)    All other components within normal limits  CBC WITH DIFFERENTIAL/PLATELET - Abnormal; Notable for the following components:   WBC 1.9 (*)    RBC 3.16 (*)    Hemoglobin 8.6 (*)    HCT 27.6 (*)    RDW 15.8 (*)    Platelets 106 (*)    Neutro Abs 1.4 (*)    Lymphs Abs 0.2 (*)    All other components within normal limits  BRAIN NATRIURETIC PEPTIDE - Abnormal; Notable for the following components:   B Natriuretic Peptide 3,795.6 (*)    All other components within normal limits  TROPONIN I (HIGH SENSITIVITY) - Abnormal; Notable for the following components:    Troponin I (High Sensitivity) 63 (*)    All other components within normal limits  TROPONIN I (HIGH SENSITIVITY) - Abnormal; Notable for the following components:   Troponin I (High Sensitivity) 64 (*)    All other components within normal limits  RESP PANEL BY RT-PCR (FLU A&B, COVID) ARPGX2  HEPATIC FUNCTION PANEL  LIPASE, BLOOD  BLOOD GAS, VENOUS  IRON AND TIBC  FERRITIN  BASIC METABOLIC PANEL  CBC  I-STAT BETA HCG BLOOD, ED (MC, WL, AP ONLY)    EKG EKG Interpretation  Date/Time:  Friday January 21 2021 10:17:36 EST Ventricular Rate:  110 PR Interval:    QRS Duration: 100 QT Interval:  365 QTC Calculation: 494 R Axis:   45 Text Interpretation: Sinus tachycardia Probable left atrial enlargement LVH with secondary repolarization abnormality Borderline prolonged QT interval No significant change fromr Dec 21 2020 ecg Confirmed by Octaviano Glow [phone]) on 01/21/2021 10:24:36 AM   Radiology DG Chest Portable 1 View  Result Date: 01/21/2021 CLINICAL DATA:  Shortness of breath EXAM: PORTABLE CHEST 1 VIEW COMPARISON:  12/21/2020 FINDINGS: Cardiac shadow is enlarged but stable. Lungs are clear. No bony abnormality is noted. IMPRESSION: No acute abnormality noted. Electronically Signed   By: Inez Catalina M.D.   On: 01/21/2021 11:11    Procedures Procedures   Medications Ordered in ED Medications  nitroGLYCERIN (NITROSTAT) SL tablet 0.4 mg (has no administration in time range)  aspirin EC tablet 81 mg (81 mg Oral Given 01/21/21 1619)  atovaquone (MEPRON) 750 MG/5ML suspension 1,500 mg (has no administration in time range)  atorvastatin (LIPITOR) tablet 40 mg (40 mg Oral Not Given 01/21/21 1618)  carvedilol (COREG) tablet 12.5 mg (12.5 mg Oral Given 01/21/21 1619)  sacubitril-valsartan (ENTRESTO) 24-26 mg per tablet (1 tablet Oral Patient Refused/Not Given 01/21/21 1817)  nicotine (NICODERM CQ - dosed in mg/24 hours) patch 14 mg (14 mg Transdermal Patch Applied 01/21/21 1621)   sodium chloride flush (NS) 0.9 % injection  3 mL (3 mLs Intravenous Given 01/21/21 1358)  sodium chloride flush (NS) 0.9 % injection 3 mL (has no administration in time range)  0.9 %  sodium chloride infusion (has no administration in time range)  acetaminophen (TYLENOL) tablet 650 mg (has no administration in time range)  ondansetron (ZOFRAN) injection 4 mg (has no administration in time range)  enoxaparin (LOVENOX) injection 40 mg (40 mg Subcutaneous Given 01/21/21 1412)  ipratropium-albuterol (DUONEB) 0.5-2.5 (3) MG/3ML nebulizer solution 3 mL (3 mLs Nebulization Given 01/21/21 1816)  guaiFENesin (MUCINEX) 12 hr tablet 600 mg (600 mg Oral Given 01/21/21 1412)  labetalol (NORMODYNE) injection 10 mg (has no administration in time range)  feeding supplement (ENSURE ENLIVE / ENSURE PLUS) liquid 237 mL (237 mLs Oral Given 01/21/21 1626)  morphine 4 MG/ML injection 4 mg (4 mg Intravenous Given 01/21/21 1116)  furosemide (LASIX) injection 40 mg (40 mg Intravenous Given 01/21/21 1245)    ED Course  I have reviewed the triage vital signs and the nursing notes.  Pertinent labs & imaging results that were available during my care of the patient were reviewed by me and considered in my medical decision making (see chart for details).   45 yo female w/ complicated medical hx as noted above here with dyspnea, chest/epigastric discomfort.  This is very likely a CHF exacerbation in setting of medication noncompliance since her discharge.  We'll check xray, labs.  She will likely need diuresis.  There may be some component of demand ischemia as well.  I explained to her the very poor prognosis of end stage heart failure, particularly without medications.  I stressed the importance of medical compliance. Her AIDS also pre-disposes her to opportunistic infections, with a CD4 < 35 and noncompliance with her antiviral medications.  She understands this prognosis.  She is asking to speak to a cardiologist about the  risks and benefits of various cardiac medications.  Labs reviewed - chronically leukopenic, WBC 1.9.  Hgb 8.6.  Trop mildly elevated, consistent with CHF, less likely ACS.  Covid negative.  BNP 3795.  Prior medical records reviewed including recent hospitalization and cardiology consult  IV lasix ordered for diuresis, IV morphine for abdominal pain  DG chest ordered and reviewed with cardiomegaly, no other focal findings  No clear indication of infection at this time.  DNR/DNI.  Clinical Course as of 01/21/21 1827  Fri Jan 21, 2021  1303 Cardiology consulted.  Lasix ordered for diuresis.  BNP elevated from discharge.  CHF team will see her, they request medical admission [MT]  1337 Admitted to triad hospitalist.  [MT]    Clinical Course User Index [MT] Langston Masker Carola Rhine, MD    Final Clinical Impression(s) / ED Diagnoses Final diagnoses:  HIV infection, unspecified symptom status ([place])  Acute on chronic congestive heart failure, unspecified heart failure type ([place])  Dyspnea, unspecified type    Rx / DC Orders ED Discharge Orders    None       Wyvonnia Dusky, MD 01/21/21 1827

## 2021-01-21 NOTE — ED Notes (Signed)
Pt moved to yellow zone

## 2021-01-21 NOTE — H&P (Addendum)
History and Physical    Jane Estrada HQI:[id number] DOB: June 13, 1976 DOA: 01/21/2021  PCP: Marliss Coots, NP (Confirm with patient/family/NH records and if not entered, this has to be entered at [facility] point of entry) Patient coming from: Home  I have personally briefly reviewed patient's old medical records in [place]  Chief Complaint: SOB  HPI: Jane Estrada is a 45 y.o. 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  Patient has not been compliant with her CHF medications, and she sopped taking all his CHF medications 3 weeks ago, she claims that she read through the information about CHF medications and feels like risks outweighed benefit.  She claims Lasix makes her constipated and Aldactone has a risk of making nipple discharges.  She she denied any cough wheezing, she started to feel shortness of breath about 7 days ago, she used albuterol inhaler with minimum help and only thing helpful she claims is the steams coming out from hot shower every night.  She started to feel heaviness on her chest this morning and came to the ED.  She does still take atovaquone, she refused to be restarted on HAART which she feels has been making her sick and " I have been doing fine without HIV medication since 2015". She still smoking 10 cigarettes a day.  ED Course: Fluid overload, given 40 mg Lasix, blood pressure significantly elevated.  Chest x-ray showed lungs congested, with fluid in the fissures.  Review of Systems: As per HPI otherwise 14 point review of systems negative.    Past Medical History:  Diagnosis Date  . Bipolar affective ([place])   . Chronic systolic CHF (congestive heart failure) ([place])   . Granular cell tumor    . HIV (human immunodeficiency virus infection) ([place])   . Hypertension   . Myocardial infarct ([place]) 2006  . Tobacco dependence     Past Surgical History:  Procedure Laterality Date  . none       reports that she has been smoking cigarettes. She has a 15.00 pack-year smoking history. She has never used smokeless tobacco. She reports previous alcohol use. She reports current drug use. Frequency: 1.00 time per week. Drugs: Marijuana and "Crack" cocaine.  Allergies  Allergen Reactions  . Lisinopril Anaphylaxis  . Remeron [Mirtazapine] Shortness Of Breath  . Abilify [Aripiprazole] Rash    Family History  Problem Relation Age of Onset  . Hypertension Mother   . Cancer Father     Prior to Admission medications   Medication Sig Start Date End Date Taking? Authorizing Provider  aspirin 81 MG EC tablet Take 1 tablet (81 mg total) by mouth daily. Swallow whole. 12/29/20 12/24/21  Kayleen Memos, DO  atorvastatin (LIPITOR) 40 MG tablet Take 1 tablet (40 mg total) by mouth daily. 12/29/20 03/29/21  Kayleen Memos, DO  atovaquone (MEPRON) 750 MG/5ML suspension Take 10 mLs (1,500 mg total) by mouth daily with breakfast. 12/29/20 03/29/21  Kayleen Memos, DO  carvedilol (COREG) 6.25 MG tablet Take 1 tablet (6.25 mg total) by mouth 2 (two) times daily with a meal. 12/29/20 03/29/21  Hall, Lorenda Cahill, DO  nicotine (NICODERM CQ - DOSED IN MG/24 HOURS) 14 mg/24hr patch Place 1 patch (14 mg total) onto the skin daily. 12/29/20  Hall, Carole N, DO  sacubitril-valsartan (ENTRESTO) 24-26 MG Take 1 tablet by mouth 2 (two) times daily. 12/29/20 03/29/21  Kayleen Memos, DO  spironolactone (ALDACTONE) 25 MG tablet Take 0.5 tablets (12.5 mg total) by mouth daily. 12/29/20 03/29/21  Kayleen Memos, DO    Physical Exam: Vitals:   01/21/21 1200 01/21/21 1215 01/21/21 1230 01/21/21 1245  BP: (!) 161/133 (!) 165/127 (!) 159/126 (!) 167/127  Pulse:   (!) 105   Resp: (!) 31 (!) 40 (!) 22 (!) 21  Temp:      TempSrc:       SpO2:   100%   Weight:      Height:        Constitutional: NAD, calm, comfortable Vitals:   01/21/21 1200 01/21/21 1215 01/21/21 1230 01/21/21 1245  BP: (!) 161/133 (!) 165/127 (!) 159/126 (!) 167/127  Pulse:   (!) 105   Resp: (!) 31 (!) 40 (!) 22 (!) 21  Temp:      TempSrc:      SpO2:   100%   Weight:      Height:       Eyes: PERRL, lids and conjunctivae normal ENMT: Mucous membranes are moist. Posterior pharynx clear of any exudate or lesions.Normal dentition.  Neck: normal, supple, no masses, no thyromegaly Respiratory: clear to auscultation bilaterally, scattered wheezing, no crackles.  Increasing respiratory effort. No accessory muscle use.  Cardiovascular: Regular rate and rhythm, no murmurs / rubs / gallops. No extremity edema. 2+ pedal pulses. No carotid bruits.  Abdomen: no tenderness, no masses palpated. No hepatosplenomegaly. Bowel sounds positive.  Musculoskeletal: no clubbing / cyanosis. No joint deformity upper and lower extremities. Good ROM, no contractures. Normal muscle tone.  Skin: no rashes, lesions, ulcers. No induration Neurologic: CN 2-12 grossly intact. Sensation intact, DTR normal. Strength 5/5 in all 4.  Psychiatric: Normal judgment and insight. Alert and oriented x 3. Normal mood.     Labs on Admission: I have personally reviewed following labs and imaging studies  CBC: Recent Labs  Lab 01/21/21 1047  WBC 1.9*  NEUTROABS 1.4*  HGB 8.6*  HCT 27.6*  MCV 87.3  PLT 683*   Basic Metabolic Panel: Recent Labs  Lab 01/21/21 1047  NA 138  K 4.2  CL 107  CO2 19*  GLUCOSE 112*  BUN 25*  CREATININE 1.18*  CALCIUM 9.0   GFR: Estimated Creatinine Clearance: 47.6 mL/min (A) (by C-G formula based on SCr of 1.18 mg/dL (H)). Liver Function Tests: Recent Labs  Lab 01/21/21 1047  AST 35  ALT 16  ALKPHOS 38  BILITOT 1.1  PROT 7.1  ALBUMIN 3.6   Recent Labs  Lab 01/21/21 1047  LIPASE 37   No results for input(s): AMMONIA in the last 168  hours. Coagulation Profile: No results for input(s): INR, PROTIME in the last 168 hours. Cardiac Enzymes: No results for input(s): CKTOTAL, CKMB, CKMBINDEX, TROPONINI in the last 168 hours. BNP (last 3 results) No results for input(s): PROBNP in the last 8760 hours. HbA1C: No results for input(s): HGBA1C in the last 72 hours. CBG: No results for input(s): GLUCAP in the last 168 hours. Lipid Profile: No results for input(s): CHOL, HDL, LDLCALC, TRIG, CHOLHDL, LDLDIRECT in the last 72 hours. Thyroid Function Tests: No results for input(s): TSH, T4TOTAL, FREET4, T3FREE, THYROIDAB in the last 72 hours. Anemia Panel: No results for input(s): VITAMINB12, FOLATE, FERRITIN, TIBC, IRON, RETICCTPCT in the last 72 hours. Urine analysis:    Component Value  Date/Time   COLORURINE YELLOW 09/16/2020 1227   APPEARANCEUR HAZY (A) 09/16/2020 1227   LABSPEC 1.012 09/16/2020 1227   PHURINE 7.0 09/16/2020 1227   GLUCOSEU NEGATIVE 09/16/2020 1227   HGBUR MODERATE (A) 09/16/2020 1227   BILIRUBINUR NEGATIVE 09/16/2020 [place] 09/16/2020 1227   PROTEINUR NEGATIVE 09/16/2020 1227   NITRITE NEGATIVE 09/16/2020 1227   LEUKOCYTESUR NEGATIVE 09/16/2020 1227    Radiological Exams on Admission: DG Chest Portable 1 View  Result Date: 01/21/2021 CLINICAL DATA:  Shortness of breath EXAM: PORTABLE CHEST 1 VIEW COMPARISON:  12/21/2020 FINDINGS: Cardiac shadow is enlarged but stable. Lungs are clear. No bony abnormality is noted. IMPRESSION: No acute abnormality noted. Electronically Signed   By: Inez Catalina M.D.   On: 01/21/2021 11:11    EKG: Independently reviewed. Sinus tachy, LVH  Assessment/Plan Active Problems:   Chronic systolic CHF (congestive heart failure) (H[facility])   CHF (congestive heart failure) ([place])  (please populate well all problems here in Problem List. (For example, if patient is on BP meds at home and you resume or decide to hold them, it is a problem that needs to be her.  Same for CAD, COPD, HLD and so on)  Acute on chronic combined systolic and diastolic CHF decompensation -From noncompliant with CHF medications -Restart Entresto, hold Aldactone as per patient's request -Problem seems to be uncontrolled hypertension, and patient received 1 dose of Lasix in the ED, will monitor response and then decide further diuresis regimen. -As needed labetalol -Cardiology consulted and will come to see the patient this afternoon. -Not a candidate for AICD given her Code status as DNR.  Asthma exacerbation -As needed DuoNeb -Peak flow  HIV -Noncompliant with HAART. -Long discussion with patient regarding benefit versus risk, patient however remained adamant that she does not want restart HAART. "I feel fine since 2015 without HIV meds".  At this point patient understand that she likely will catch severe/life threatening infections without restarting HAART, but maintains that he chooses not be on  HAART. -Continue atovaquone  Cigarette smoker -Educated to quit, nicotine patch  Pancytopenia -Probably related to HIV infection -Check iron study  Non-anion gap metabolic acidosis -Check VBG  CKD stage II -Mild fluid overload, received Lasix, monitor BMP  Severe protein calorie malnutrition -Secondary to HIV infection -Protein shakes, consult nutrition  DVT prophylaxis: Lovenox Code Status: DNR Family Communication: None at bedside Disposition Plan: Expect more than 2 midnight hospital stay to treat CHF decompensation. Consults called: Cardiology Admission status: Tele admit  Lequita Halt MD [facility] Pager [phone]  01/21/2021, 1:52 PM

## 2021-01-21 NOTE — ED Notes (Signed)
Help get patient on the monitor patient is resting with call bell in reach 

## 2021-01-22 ENCOUNTER — Inpatient Hospital Stay (HOSPITAL_COMMUNITY): Payer: Medicaid - Out of State

## 2021-01-22 DIAGNOSIS — I509 Heart failure, unspecified: Secondary | ICD-10-CM

## 2021-01-22 DIAGNOSIS — I5041 Acute combined systolic (congestive) and diastolic (congestive) heart failure: Secondary | ICD-10-CM

## 2021-01-22 LAB — CBC
HCT: 27 % — ABNORMAL LOW (ref 36.0–46.0)
Hemoglobin: 8.2 g/dL — ABNORMAL LOW (ref 12.0–15.0)
MCH: 26.5 pg (ref 26.0–34.0)
MCHC: 30.4 g/dL (ref 30.0–36.0)
MCV: 87.1 fL (ref 80.0–100.0)
Platelets: 110 10*3/uL — ABNORMAL LOW (ref 150–400)
RBC: 3.1 MIL/uL — ABNORMAL LOW (ref 3.87–5.11)
RDW: 15.6 % — ABNORMAL HIGH (ref 11.5–15.5)
WBC: 1.9 10*3/uL — ABNORMAL LOW (ref 4.0–10.5)
nRBC: 0 % (ref 0.0–0.2)

## 2021-01-22 LAB — URINALYSIS, ROUTINE W REFLEX MICROSCOPIC
Bilirubin Urine: NEGATIVE
Glucose, UA: NEGATIVE mg/dL
Hgb urine dipstick: NEGATIVE
Ketones, ur: NEGATIVE mg/dL
Leukocytes,Ua: NEGATIVE
Nitrite: NEGATIVE
Protein, ur: NEGATIVE mg/dL
Specific Gravity, Urine: 1.01 (ref 1.005–1.030)
pH: 6 (ref 5.0–8.0)

## 2021-01-22 LAB — BASIC METABOLIC PANEL
Anion gap: 12 (ref 5–15)
BUN: 35 mg/dL — ABNORMAL HIGH (ref 6–20)
CO2: 20 mmol/L — ABNORMAL LOW (ref 22–32)
Calcium: 9 mg/dL (ref 8.9–10.3)
Chloride: 104 mmol/L (ref 98–111)
Creatinine, Ser: 1.31 mg/dL — ABNORMAL HIGH (ref 0.44–1.00)
GFR, Estimated: 52 mL/min — ABNORMAL LOW (ref >60)
Glucose, Bld: 109 mg/dL — ABNORMAL HIGH (ref 70–99)
Potassium: 3.9 mmol/L (ref 3.5–5.1)
Sodium: 136 mmol/L (ref 135–145)

## 2021-01-22 MED ORDER — ISOSORB DINITRATE-HYDRALAZINE 20-37.5 MG PO TABS
1.0000 | ORAL_TABLET | Freq: Three times a day (TID) | ORAL | Status: DC
  Administered 2021-01-22: 1 via ORAL
  Filled 2021-01-22 (×2): qty 1

## 2021-01-22 MED ORDER — TRAMADOL HCL 50 MG PO TABS
50.0000 mg | ORAL_TABLET | Freq: Three times a day (TID) | ORAL | Status: DC | PRN
  Administered 2021-01-22 – 2021-01-26 (×7): 50 mg via ORAL
  Filled 2021-01-22 (×7): qty 1

## 2021-01-22 NOTE — Progress Notes (Signed)
Having Left sided flank pain that started about an hour ago. Given tylenol and heat pack. She describes as sharp pain and pulling and pain level 10/10. Denies history of renal stone or blood in urine. Dr Tyrell Antonio notified. Orders received.

## 2021-01-22 NOTE — Plan of Care (Signed)
Problem & Goals Congestive Heart Failure Diagnosis  Health Risk Assessment Findings         Interventions  Member/Caregiver will receive educational packet four times per year and newsletter twice a year: Importance of daily weights Importance of blood pressure control Importance of reducing salt intake Importance of smoking cessation if applicable Early signs of worsening of condition Importance of dietary compliance Information of follow-up with provider General recommendations for screenings and prevention   Care Management will: Monitor emergency department and inpatient hospital admissions and encourage more frequent provider office visits.   Physician Interventions: Monitor timely and appropriate medication refills  Monitor results to determine if further interventions need to be developed and addressed, i.e. blood pressure monitoring Transition of Care - see your patient within 7 -14 days of all hospitalizations.  Complete medication reconciliation during follow-up visit.  Include documentation that the medications prescribed/ordered at discharge where reconciled with the patient's current medications. At least annually, address the following with your patients and document in patient's MEDICAL RECORD NUMBER           Advance Care Planning            Medication Review            Functional Status Assessment - Prescibe necessary assistive devices            Comprehensive Pain Screening            Depression assessment PHQ-2

## 2021-01-22 NOTE — Progress Notes (Signed)
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 Stopped taking all her heart failure medications 3 weeks prior to admission, see related information available heart failure medications and feels like risk outweighs benefit of taking the medication. Presented with heaviness in her chest this morning.   Patient in the ED, BNP 3795, troponin 63, chest x-ray no acute finding.  Is admitted for acute on chronic heart failure exacerbation management.  Assessment & Plan:   Active Problems:   Chronic systolic CHF (congestive heart failure) (HCC)   CHF (congestive heart failure) (HCC)   1-acute on chronic combined systolic and diastolic heart failure decompensation -Likely from noncompliance with medications. Continue with IV Lasix Patient cardiology evaluation Started on Imdur.   2-asthma: Continue with nebulizer  3-HIV noncompliant with heart. Patient refused to take any medication Continue with atovaquone  4-Current smoker: Continue with nicotine patch  5-Pancytopenia ; likely related to HIV  6-Non-anion gap metabolic acidosis:  7-CKD stage II: Monitor renal function and Lexis.  8-Severe protein caloric malnutrition: Continue with protein Ensure  Estimated body mass index is 19.3 kg/m as calculated from the following:   Height as of this encounter: 5\' 4"  (1.626 m).   Weight as of this encounter: 51 kg.   DVT prophylaxis: Lovenox Code Status: DNR Family Communication:Care discussed with patient Disposition Plan:  Status is: Inpatient  Remains inpatient appropriate  because:IV treatments appropriate due to intensity of illness or inability to take PO   Dispo: The patient is from: Home              Anticipated d/c is to: Home              Anticipated d/c date is: 3 days              Patient currently is not medically stable to d/c.   Difficult to place patient No        Consultants:   Cardiology   Procedures:   none  Antimicrobials:    Subjective: She does not want to take spironolactone or any HIV medication due to sid effect.  She is breathing better. She is willing to take lasix.   Objective: Vitals:   01/22/21 0828 01/22/21 0851 01/22/21 1000 01/22/21 1255  BP: (!) 127/97 (!) 127/97  130/85  Pulse: 81   77  Resp: 18 18  18   Temp:  97.9 F (36.6 C)  98 F (36.7 C)  TempSrc: Oral Oral  Oral  SpO2: 98% 99% 99% 100%  Weight:      Height:        Intake/Output Summary (Last 24 hours) at 01/22/2021 1608 Last data filed at 01/22/2021 1255 Gross per 24 hour  Intake 832 ml  Output 1200 ml  Net -368 ml   Filed Weights   01/21/21 1024 01/22/21 0504  Weight: 49 kg 51 kg    Examination:  General exam: Appears calm and comfortable  Respiratory system: Bilateral crackles.  Cardiovascular system: S1 & S2 heard, RRR. Gastrointestinal system: Abdomen is nondistended, soft and nontender. No organomegaly or masses felt. Normal bowel sounds heard. Central nervous system: Alert and oriented.  No focal neurological deficits. Extremities: Symmetric 5 x 5 power. Plus 2 edema   Data Reviewed: I have personally reviewed following labs and imaging studies  CBC: Recent Labs  Lab 01/21/21 1047 01/22/21 0344  WBC 1.9* 1.9*  NEUTROABS 1.4*  --   HGB 8.6* 8.2*  HCT 27.6* 27.0*  MCV 87.3 87.1  PLT 106* 213*   Basic Metabolic Panel: Recent Labs  Lab 01/21/21 1047 01/22/21 0344  NA 138 136  K 4.2 3.9  CL 107 104  CO2 19* 20*  GLUCOSE 112* 109*  BUN 25* 35*  CREATININE 1.18* 1.31*  CALCIUM 9.0 9.0   GFR: Estimated  Creatinine Clearance: 44.6 mL/min (A) (by C-G formula based on SCr of 1.31 mg/dL (H)). Liver Function Tests: Recent Labs  Lab 01/21/21 1047  AST 35  ALT 16  ALKPHOS 38  BILITOT 1.1  PROT 7.1  ALBUMIN 3.6   Recent Labs  Lab 01/21/21 1047  LIPASE 37   No results for input(s): AMMONIA in the last 168 hours. Coagulation Profile: No results for input(s): INR, PROTIME in the last 168 hours. Cardiac Enzymes: No results for input(s): CKTOTAL, CKMB, CKMBINDEX, TROPONINI in the last 168 hours. BNP (last 3 results) No results for input(s): PROBNP in the last 8760 hours. HbA1C: No results for input(s): HGBA1C in the last 72 hours. CBG: No results for input(s): GLUCAP in the last 168 hours. Lipid Profile: No results for input(s): CHOL, HDL, LDLCALC, TRIG, CHOLHDL, LDLDIRECT in the last 72 hours. Thyroid Function Tests: No results for input(s): TSH, T4TOTAL, FREET4, T3FREE, THYROIDAB in the last 72 hours. Anemia Panel: Recent Labs    01/21/21 2124  FERRITIN 26  TIBC 459*  IRON 60   Sepsis Labs: No results for input(s): PROCALCITON, LATICACIDVEN in the last 168 hours.  Recent Results (from the past 240 hour(s))  Resp Panel by RT-PCR (Flu A&B, Covid) Nasopharyngeal Swab     Status: None   Collection Time: 01/21/21 10:51 AM   Specimen: Nasopharyngeal Swab; Nasopharyngeal(NP) swabs in vial transport medium  Result Value Ref Range Status   SARS Coronavirus 2 by RT PCR NEGATIVE NEGATIVE Final    Comment: (NOTE) SARS-CoV-2 target nucleic acids are NOT DETECTED.  The SARS-CoV-2 RNA is generally detectable in upper respiratory specimens during the acute phase of infection. The lowest concentration of SARS-CoV-2 viral copies this assay can detect is 138 copies/mL. A negative result does not preclude SARS-Cov-2 infection and should not be used as the sole basis for treatment or other patient management decisions. A negative result may occur with  improper specimen  collection/handling, submission of specimen other than nasopharyngeal swab, presence of viral mutation(s) within the areas targeted by this assay, and inadequate number of viral copies(<138 copies/mL). A negative result must be combined with clinical observations, patient history, and epidemiological information. The expected result is Negative.  Fact Sheet for Patients:  EntrepreneurPulse.com.au  Fact Sheet for Healthcare Providers:  IncredibleEmployment.be  This test is no t yet approved or cleared by the [place] FDA and  has been authorized for detection and/or diagnosis of SARS-CoV-2 by FDA under an Emergency Use Authorization (EUA). This EUA will remain  in effect (meaning this test can be used) for the duration of the COVID-19 declaration under Section 564(b)(1) of the Act, 21 U.S.C.section 360bbb-3(b)(1), unless the authorization is terminated  or revoked sooner.       Influenza A by PCR NEGATIVE NEGATIVE Final   Influenza B by PCR NEGATIVE NEGATIVE Final  Comment: (NOTE) The Xpert Xpress SARS-CoV-2/FLU/RSV plus assay is intended as an aid in the diagnosis of influenza from Nasopharyngeal swab specimens and should not be used as a sole basis for treatment. Nasal washings and aspirates are unacceptable for Xpert Xpress SARS-CoV-2/FLU/RSV testing.  Fact Sheet for Patients: EntrepreneurPulse.com.au  Fact Sheet for Healthcare Providers: IncredibleEmployment.be  This test is not yet approved or cleared by the [place] FDA and has been authorized for detection and/or diagnosis of SARS-CoV-2 by FDA under an Emergency Use Authorization (EUA). This EUA will remain in effect (meaning this test can be used) for the duration of the COVID-19 declaration under Section 564(b)(1) of the Act, 21 U.S.C. section 360bbb-3(b)(1), unless the authorization is terminated or revoked.  Performed at [facility], [place] [address], [place], [place] [address]          Radiology Studies: DG Chest Portable 1 View  Result Date: 01/21/2021 CLINICAL DATA:  Shortness of breath EXAM: PORTABLE CHEST 1 VIEW COMPARISON:  12/21/2020 FINDINGS: Cardiac shadow is enlarged but stable. Lungs are clear. No bony abnormality is noted. IMPRESSION: No acute abnormality noted. Electronically Signed   By: Inez Catalina M.D.   On: 01/21/2021 11:11        Scheduled Meds: . aspirin EC  81 mg Oral Daily  . atorvastatin  40 mg Oral Daily  . atovaquone  1,500 mg Oral Q breakfast  . enoxaparin (LOVENOX) injection  40 mg Subcutaneous Q24H  . feeding supplement  237 mL Oral BID BM  . furosemide  40 mg Intravenous BID  . guaiFENesin  600 mg Oral BID  . isosorbide-hydrALAZINE  1 tablet Oral TID  . nicotine  14 mg Transdermal Daily  . sodium chloride flush  3 mL Intravenous Q12H   Continuous Infusions: . sodium chloride       LOS: 1 day    Time spent: 35 minutes.     Elmarie Shiley, MD [facility]   If 7PM-7AM, please contact night-coverage [email or web address]  01/22/2021, 4:08 PM

## 2021-01-22 NOTE — Progress Notes (Signed)
Progress Note  Patient Name: Jane Estrada Date of Encounter: 01/22/2021  [facility] Cardiologist: Jenkins Rouge, MD   Subjective   Patient states breathing somewhat improved. Continues with cough. States she stopped her medications as she read about the side-effects and they seemed worse than the benefits of the medications.  Wt 108-->112; I/Os not accurate Cr 1.18-->1.31  Inpatient Medications    Scheduled Meds: . aspirin EC  81 mg Oral Daily  . atorvastatin  40 mg Oral Daily  . atovaquone  1,500 mg Oral Q breakfast  . carvedilol  12.5 mg Oral BID WC  . enoxaparin (LOVENOX) injection  40 mg Subcutaneous Q24H  . feeding supplement  237 mL Oral BID BM  . furosemide  40 mg Intravenous BID  . guaiFENesin  600 mg Oral BID  . nicotine  14 mg Transdermal Daily  . sacubitril-valsartan  1 tablet Oral BID  . sodium chloride flush  3 mL Intravenous Q12H   Continuous Infusions: . sodium chloride     PRN Meds: sodium chloride, acetaminophen, ipratropium-albuterol, labetalol, nitroGLYCERIN, ondansetron (ZOFRAN) IV, sodium chloride flush   Vital Signs    Vitals:   01/21/21 2354 01/22/21 0019 01/22/21 0503 01/22/21 0504  BP: 121/87  (!) 125/96   Pulse: 75  71 71  Resp: 20     Temp: 97.7 F (36.5 C)   97.8 F (36.6 C)  TempSrc: Oral Oral  Oral  SpO2: 99%  100% 100%  Weight:    51 kg  Height:        Intake/Output Summary (Last 24 hours) at 01/22/2021 0752 Last data filed at 01/22/2021 0631 Gross per 24 hour  Intake 995 ml  Output 1050 ml  Net -55 ml   Last 3 Weights 01/22/2021 01/21/2021 12/29/2020  Weight (lbs) 112 lb 7 oz 108 lb 108 lb 3.2 oz  Weight (kg) 51 kg 48.988 kg 49.079 kg      Telemetry    NSR - Personally Reviewed  ECG    No new tracing; tracing 2/11 reviewed which showed NSR, LAE, LVH with repol - Personally Reviewed  Physical Exam   GEN: No acute distress.   Neck: No JVD Cardiac: RRR, no murmurs, rubs, or gallops.  Respiratory: Rhonchorous  that partially clears with coughing GI: Soft, nontender, non-distended  MS: No edema; No deformity. Warm. Neuro:  Nonfocal  Psych: Normal affect   Labs    High Sensitivity Troponin:   Recent Labs  Lab 01/21/21 1047 01/21/21 1251  TROPONINIHS 63* 64*      Chemistry Recent Labs  Lab 01/21/21 1047 01/22/21 0344  NA 138 136  K 4.2 3.9  CL 107 104  CO2 19* 20*  GLUCOSE 112* 109*  BUN 25* 35*  CREATININE 1.18* 1.31*  CALCIUM 9.0 9.0  PROT 7.1  --   ALBUMIN 3.6  --   AST 35  --   ALT 16  --   ALKPHOS 38  --   BILITOT 1.1  --   GFRNONAA 59* 52*  ANIONGAP 12 12     Hematology Recent Labs  Lab 01/21/21 1047 01/22/21 0344  WBC 1.9* 1.9*  RBC 3.16* 3.10*  HGB 8.6* 8.2*  HCT 27.6* 27.0*  MCV 87.3 87.1  MCH 27.2 26.5  MCHC 31.2 30.4  RDW 15.8* 15.6*  PLT 106* 110*    BNP Recent Labs  Lab 01/21/21 1047  BNP 3,795.6*     DDimer No results for input(s): DDIMER in the last 168 hours.  Radiology    DG Chest Portable 1 View  Result Date: 01/21/2021 CLINICAL DATA:  Shortness of breath EXAM: PORTABLE CHEST 1 VIEW COMPARISON:  12/21/2020 FINDINGS: Cardiac shadow is enlarged but stable. Lungs are clear. No bony abnormality is noted. IMPRESSION: No acute abnormality noted. Electronically Signed   By: Inez Catalina M.D.   On: 01/21/2021 11:11    Cardiac Studies   ECHO: 12/22/2020 1. Left ventricular ejection fraction, by estimation, is 20%. The left  ventricle has severely decreased function. The left ventricle demonstrates  global hypokinesis. The left ventricular internal cavity size was severely  dilated. There is mild left  ventricular hypertrophy. Left ventricular diastolic parameters are  consistent with Grade II diastolic dysfunction (pseudonormalization).  Elevated left ventricular end-diastolic pressure.  2. Right ventricular systolic function is normal. The right ventricular  size is normal.  3. Left atrial size was mildly dilated.  4. The mitral  valve is normal in structure. Mild mitral valve  regurgitation. No evidence of mitral stenosis.  5. The aortic valve is normal in structure. Aortic valve regurgitation is  not visualized. No aortic stenosis is present.  6. The inferior vena cava is normal in size with greater than 50%  respiratory variability, suggesting right atrial pressure of 3 mmHg.   Patient Profile     46 y.o. 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  Assessment & Plan    #Acute on chronic systolic heart failure exacerbation: Patient presented with worsening SOB, orthopnea and PND in the setting of being off all medications found to have BNP 3795 consistent with acute on chronic systolic HF exacerbation. Currently with NYHA class III symptoms. LVEF in 12/2020 with EF 20%. -Continue lasix 40mg  IV BID -Hold entresto for now given rising Cr -Hold BB for now while diuresing given EF<20%; will add back slowly -Start bidil for afterload reduction -Not ICD candidate due to DNR status -Monitor I/Os and daily standing weights -Low Na diet -Will need counseling about medication compliance; may not be a great candidate for entresto as she states the medications side-effects are worse than the benefits. We talked about the benefits that this medication provides for her HF at length. She still seemed hesitant. May be better suited with bidil.  #AKI on CKD: -Diuresis as above  #Polysubstance abuse #HIV #Severe protein calorie malnutrition -Management per primary team      For  questions or updates, please contact [place] Please consult [email or web address] for contact info under        Signed, Freada Bergeron, MD  01/22/2021, 7:52 AM

## 2021-01-23 DIAGNOSIS — I509 Heart failure, unspecified: Secondary | ICD-10-CM | POA: Diagnosis not present

## 2021-01-23 DIAGNOSIS — I5041 Acute combined systolic (congestive) and diastolic (congestive) heart failure: Secondary | ICD-10-CM | POA: Diagnosis not present

## 2021-01-23 LAB — BASIC METABOLIC PANEL
Anion gap: 9 (ref 5–15)
BUN: 41 mg/dL — ABNORMAL HIGH (ref 6–20)
CO2: 24 mmol/L (ref 22–32)
Calcium: 8.7 mg/dL — ABNORMAL LOW (ref 8.9–10.3)
Chloride: 103 mmol/L (ref 98–111)
Creatinine, Ser: 1.32 mg/dL — ABNORMAL HIGH (ref 0.44–1.00)
GFR, Estimated: 51 mL/min — ABNORMAL LOW (ref >60)
Glucose, Bld: 97 mg/dL (ref 70–99)
Potassium: 3.4 mmol/L — ABNORMAL LOW (ref 3.5–5.1)
Sodium: 136 mmol/L (ref 135–145)

## 2021-01-23 MED ORDER — ENSURE ENLIVE PO LIQD
237.0000 mL | Freq: Three times a day (TID) | ORAL | Status: DC
  Administered 2021-01-23 – 2021-01-27 (×11): 237 mL via ORAL

## 2021-01-23 MED ORDER — POLYETHYLENE GLYCOL 3350 17 G PO PACK
17.0000 g | PACK | Freq: Every day | ORAL | Status: DC
  Administered 2021-01-23 – 2021-01-26 (×4): 17 g via ORAL
  Filled 2021-01-23 (×4): qty 1

## 2021-01-23 MED ORDER — POTASSIUM CHLORIDE CRYS ER 20 MEQ PO TBCR
40.0000 meq | EXTENDED_RELEASE_TABLET | Freq: Two times a day (BID) | ORAL | Status: AC
  Administered 2021-01-23 – 2021-01-24 (×2): 40 meq via ORAL
  Filled 2021-01-23 (×2): qty 2

## 2021-01-23 MED ORDER — ISOSORB DINITRATE-HYDRALAZINE 20-37.5 MG PO TABS
2.0000 | ORAL_TABLET | Freq: Three times a day (TID) | ORAL | Status: DC
  Administered 2021-01-24: 2 via ORAL
  Filled 2021-01-23 (×4): qty 2

## 2021-01-23 NOTE — Plan of Care (Signed)
  Problem: Clinical Measurements: Goal: Ability to maintain clinical measurements within normal limits will improve Outcome: Progressing Goal: Respiratory complications will improve Outcome: Progressing Goal: Cardiovascular complication will be avoided Outcome: Progressing   Problem: Activity: Goal: Risk for activity intolerance will decrease Outcome: Progressing   

## 2021-01-23 NOTE — Progress Notes (Signed)
Initial Nutrition Assessment  DOCUMENTATION CODES:   Not applicable  INTERVENTION:   Ensure Enlive po TID, each supplement provides 350 kcal and 20 grams of protein   NUTRITION DIAGNOSIS:   Increased nutrient needs related to acute illness,chronic illness as evidenced by estimated needs.  GOAL:   Patient will meet greater than or equal to 90% of their needs  MONITOR:   PO intake,Supplement acceptance,Weight trends  REASON FOR ASSESSMENT:   Consult Assessment of nutrition requirement/status  ASSESSMENT:   45 yo female admitted with acute on chronic CHF, AKI; noted pt refusing most medications PMH includes MI 2/2 cocaine use,HIV not on HAART, MICM, HTN, CHF EF 20%, bipolar disorder with multiple suicide attempts, polysubstance abuse. Pt is homeless.   No recorded po intake; noted order for Ensure BID. Reported good appetite  Current wt 51 kg; weight of 49 kg Jan 2022, 54 kg Dec 2021, 61 kg October 2021.  Pt is homeless but refused the shelter  Pt at very high risk of malnutrition and may very well be malnourished; not enough information available at this time. Continue to evaluate on follow-up as able  Labs: potassium 3.4 (L), Creatinine 1.32, BUN 41 Meds: lasix  Diet Order:   Diet Order            Diet Heart Room service appropriate? Yes; Fluid consistency: Thin; Fluid restriction: 1800 mL Fluid  Diet effective now                 EDUCATION NEEDS:   Not appropriate for education at this time  Skin:  Skin Assessment: Reviewed RN Assessment  Last BM:  2/11  Height:   Ht Readings from Last 1 Encounters:  01/21/21 5\' 4"  (1.626 m)    Weight:   Wt Readings from Last 1 Encounters:  01/22/21 51 kg    BMI:  Body mass index is 19.3 kg/m.  Estimated Nutritional Needs:   Kcal:  1785-2040 kcals  Protein:  75-100 g  Fluid:  1.8 L restriction per MD   Kerman Passey MS, RDN, LDN, CNSC Registered Dietitian III Clinical Nutrition RD Pager and On-Call  Pager Number Located in [place]

## 2021-01-23 NOTE — Progress Notes (Signed)
Progress Note  Patient Name: Jane Estrada Date of Encounter: 01/23/2021  [facility] Cardiologist: Jenkins Rouge, MD   Subjective   Patient refusing multiple medications including bidil and statin. States she thinks the side-effects are not worth it. She is "willing to take her chances" without her heart medications as there is "no medication she has not over-dosed on"  Net negative 1.14 L; Cr stable at 1.32 Wt not recorded  Inpatient Medications    Scheduled Meds: . aspirin EC  81 mg Oral Daily  . atorvastatin  40 mg Oral Daily  . atovaquone  1,500 mg Oral Q breakfast  . enoxaparin (LOVENOX) injection  40 mg Subcutaneous Q24H  . feeding supplement  237 mL Oral BID BM  . furosemide  40 mg Intravenous BID  . guaiFENesin  600 mg Oral BID  . isosorbide-hydrALAZINE  1 tablet Oral TID  . nicotine  14 mg Transdermal Daily  . sodium chloride flush  3 mL Intravenous Q12H   Continuous Infusions: . sodium chloride     PRN Meds: sodium chloride, acetaminophen, ipratropium-albuterol, labetalol, nitroGLYCERIN, ondansetron (ZOFRAN) IV, sodium chloride flush, traMADol   Vital Signs    Vitals:   01/22/21 0851 01/22/21 1000 01/22/21 1255 01/22/21 1743  BP: (!) 127/97  130/85   Pulse:   77 94  Resp: 18  18   Temp: 97.9 F (36.6 C)  98 F (36.7 C)   TempSrc: Oral  Oral   SpO2: 99% 99% 100% 99%  Weight:      Height:        Intake/Output Summary (Last 24 hours) at 01/23/2021 0805 Last data filed at 01/23/2021 0600 Gross per 24 hour  Intake 957 ml  Output 2100 ml  Net -1143 ml   Last 3 Weights 01/22/2021 01/21/2021 12/29/2020  Weight (lbs) 112 lb 7 oz 108 lb 108 lb 3.2 oz  Weight (kg) 51 kg 48.988 kg 49.079 kg      Telemetry    NSR - Personally Reviewed  ECG    No new tracing - Personally Reviewed  Physical Exam   GEN: No acute distress.   Neck: Supple Cardiac: RRR, no murmurs, rubs, or gallops.  Respiratory: Crackles at the bases bilaterally GI: Soft,  nontender, non-distended  MS: No edema; No deformity. Neuro:  Nonfocal  Psych: Normal affect   Labs    High Sensitivity Troponin:   Recent Labs  Lab 01/21/21 1047 01/21/21 1251  TROPONINIHS 63* 64*      Chemistry Recent Labs  Lab 01/21/21 1047 01/22/21 0344 01/23/21 0637  NA 138 136 136  K 4.2 3.9 3.4*  CL 107 104 103  CO2 19* 20* 24  GLUCOSE 112* 109* 97  BUN 25* 35* 41*  CREATININE 1.18* 1.31* 1.32*  CALCIUM 9.0 9.0 8.7*  PROT 7.1  --   --   ALBUMIN 3.6  --   --   AST 35  --   --   ALT 16  --   --   ALKPHOS 38  --   --   BILITOT 1.1  --   --   GFRNONAA 59* 52* 51*  ANIONGAP 12 12 9      Hematology Recent Labs  Lab 01/21/21 1047 01/22/21 0344  WBC 1.9* 1.9*  RBC 3.16* 3.10*  HGB 8.6* 8.2*  HCT 27.6* 27.0*  MCV 87.3 87.1  MCH 27.2 26.5  MCHC 31.2 30.4  RDW 15.8* 15.6*  PLT 106* 110*    BNP Recent Labs  Lab  01/21/21 1047  BNP 3,795.6*     DDimer No results for input(s): DDIMER in the last 168 hours.   Radiology    DG Chest Portable 1 View  Result Date: 01/21/2021 CLINICAL DATA:  Shortness of breath EXAM: PORTABLE CHEST 1 VIEW COMPARISON:  12/21/2020 FINDINGS: Cardiac shadow is enlarged but stable. Lungs are clear. No bony abnormality is noted. IMPRESSION: No acute abnormality noted. Electronically Signed   By: Inez Catalina M.D.   On: 01/21/2021 11:11   DG Abd 2 Views  Result Date: 01/22/2021 CLINICAL DATA:  Left lower quadrant and left flank pain as well as diarrhea for 4 days. EXAM: ABDOMEN - 2 VIEW COMPARISON:  None. FINDINGS: No bowel dilation to suggest obstruction. No bowel air-fluid levels. No evidence of adynamic ileus. No free air. Abdominal and pelvic soft tissues are unremarkable. There is mild increased colonic stool burden. Stomach appears to the moderately distended. Skeletal structures are unremarkable. IMPRESSION: 1. No acute findings. No evidence of bowel obstruction, generalized adynamic ileus or free air. 2. Mild increase in  colonic stool burden. Stomach moderately distended. Electronically Signed   By: Lajean Manes M.D.   On: 01/22/2021 20:03    Cardiac Studies   ECHO:12/22/2020 1. Left ventricular ejection fraction, by estimation, is 20%. The left  ventricle has severely decreased function. The left ventricle demonstrates  global hypokinesis. The left ventricular internal cavity size was severely  dilated. There is mild left  ventricular hypertrophy. Left ventricular diastolic parameters are  consistent with Grade II diastolic dysfunction (pseudonormalization).  Elevated left ventricular end-diastolic pressure.  2. Right ventricular systolic function is normal. The right ventricular  size is normal.  3. Left atrial size was mildly dilated.  4. The mitral valve is normal in structure. Mild mitral valve  regurgitation. No evidence of mitral stenosis.  5. The aortic valve is normal in structure. Aortic valve regurgitation is  not visualized. No aortic stenosis is present.  6. The inferior vena cava is normal in size with greater than 50%  respiratory variability, suggesting right atrial pressure of 3 mmHg.   Patient Profile     45 y.o. female with history of MIsecondary to cocaine use,NICM w/ no CAD at cath in [place], [place] EF40% in 2018>> 20% in Jan 2022, bipolar d/o with multiple suicide attempts, tobacco use, polysubstance abuse(mainly cocaine and THC), HIVnot on HAART, granular cell tumor of the trachea,and homelessness who presented with worsening SOB, orthopnea and PND in the setting of being off all medications found to have BNP 3795 consistent with acute on chronic systolic heart failure exacerbation.  Assessment & Plan    #Acute on chronic systolic heart failure exacerbation: Patient presented with worsening SOB, orthopnea and PND in the setting of being off all medications found to have BNP 3795 consistent with acute on chronic systolic HF exacerbation. Currently with NYHA class  III symptoms. LVEF in 12/2020 with EF 20%. -Continue lasix 40mg  IV BID -Patient refusing most medications including bidil and statin--she is adament that all she wants is lasix and no other HF medications due to side-effects. We spoke at length that the lack of medications is what has led to her multiple hospitalizations. She states she "will take her chances" as the meds "are not worth it". She is not a candidate for any advanced therapies. Will try to optimize medications as able -Continue bidil if patient willing to take it -Ideally, would like to be on BB and entresto but patient refusing/non-complaint -Not ICD candidate due to  DNR status/non-compliance -Monitor I/Os and daily standing weights -Low Na diet  #AKI on CKD: -Diuresis as above  #Polysubstance abuse #HIV #Severe protein calorie malnutrition -Management per primary team      For questions or updates, please contact [place] Please consult [email or web address] for contact info under        Signed, Freada Bergeron, MD  01/23/2021, 8:05 AM

## 2021-01-23 NOTE — Progress Notes (Signed)
Patient's diastolic BP elevated, refuses Bidil.

## 2021-01-23 NOTE — Progress Notes (Signed)
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 Stopped taking all her heart failure medications 3 weeks prior to admission, see related information available heart failure medications and feels like risk outweighs benefit of taking the medication. Presented with heaviness in her chest this morning.   Patient in the ED, BNP 3795, troponin 63, chest x-ray no acute finding.  Is admitted for acute on chronic heart failure exacerbation management.  Assessment & Plan:   Active Problems:   Chronic systolic CHF (congestive heart failure) (HCC)   CHF (congestive heart failure) (HCC)   1-Acute on Chronic Combined Systolic and Diastolic heart failure decompensation -Likely from noncompliance with medications.  -Continue with IV Lasix Appreciate cardiology evaluation Started on Imdur. Patient refuse to take imdur.  Negative 1.1 L   2-Asthma: Continue with nebulizer  3-HIV noncompliant with Medications.  Patient refused to take any medication Continue with atovaquone  4-Current smoker: Continue with nicotine patch  5-Pancytopenia ; likely related to HIV  6-Non-anion gap metabolic acidosis: Resolved.   7-CKD stage II: Monitor renal function and Lasix. Stable.   8-Severe protein caloric malnutrition: Continue with protein Ensure 9-Flank Pain; UA negative, KUB showed colonic stool burden.  Start Miralax.  10-hypokalemia; start supplement.   Estimated body mass index is 19.3 kg/m as calculated from the following:   Height as of this encounter: 5\' 4"  (1.626 m).   Weight as of  this encounter: 51 kg.   DVT prophylaxis: Lovenox Code Status: DNR Family Communication:Care discussed with patient Disposition Plan:  Status is: Inpatient  Remains inpatient appropriate because:IV treatments appropriate due to intensity of illness or inability to take PO   Dispo: The patient is from: Home              Anticipated d/c is to: Home              Anticipated d/c date is: 3 days              Patient currently is not medically stable to d/c.   Difficult to place patient No        Consultants:   Cardiology   Procedures:   none  Antimicrobials:    Subjective: Flank pain is better.  Refuse to take Imdur.  Agree to take lasix and miralax.   Objective: Vitals:   01/22/21 0851 01/22/21 1000 01/22/21 1255 01/22/21 1743  BP: (!) 127/97  130/85   Pulse:   77 94  Resp: 18  18   Temp: 97.9 F (36.6 C)  98 F (36.7 C)   TempSrc: Oral  Oral   SpO2: 99% 99% 100% 99%  Weight:      Height:        Intake/Output Summary (Last 24 hours) at 01/23/2021 1536 Last data filed at 01/23/2021 0600 Gross per 24 hour  Intake 720 ml  Output 1200 ml  Net -480 ml   Filed Weights   01/21/21 1024 01/22/21 0504  Weight: 49 kg 51 kg    Examination:  General exam: NAD Respiratory system: B/L crackles.  Cardiovascular system: S 1, S  2RRR Gastrointestinal system: BS present, soft, nt  Central nervous system: alert and orieneted Extremities: trace edema  Data Reviewed: I have personally reviewed following labs and imaging studies  CBC: Recent Labs  Lab 01/21/21 1047 01/22/21 0344  WBC 1.9* 1.9*  NEUTROABS 1.4*  --   HGB 8.6* 8.2*  HCT 27.6* 27.0*  MCV 87.3 87.1  PLT 106* 102*   Basic Metabolic Panel: Recent Labs  Lab 01/21/21 1047 01/22/21 0344 01/23/21 0637  NA 138 136 136  K 4.2 3.9 3.4*  CL 107 104 103  CO2 19* 20* 24  GLUCOSE 112* 109* 97  BUN 25* 35* 41*  CREATININE 1.18* 1.31* 1.32*  CALCIUM 9.0 9.0 8.7*   GFR: Estimated Creatinine  Clearance: 44.2 mL/min (A) (by C-G formula based on SCr of 1.32 mg/dL (H)). Liver Function Tests: Recent Labs  Lab 01/21/21 1047  AST 35  ALT 16  ALKPHOS 38  BILITOT 1.1  PROT 7.1  ALBUMIN 3.6   Recent Labs  Lab 01/21/21 1047  LIPASE 37   No results for input(s): AMMONIA in the last 168 hours. Coagulation Profile: No results for input(s): INR, PROTIME in the last 168 hours. Cardiac Enzymes: No results for input(s): CKTOTAL, CKMB, CKMBINDEX, TROPONINI in the last 168 hours. BNP (last 3 results) No results for input(s): PROBNP in the last 8760 hours. HbA1C: No results for input(s): HGBA1C in the last 72 hours. CBG: No results for input(s): GLUCAP in the last 168 hours. Lipid Profile: No results for input(s): CHOL, HDL, LDLCALC, TRIG, CHOLHDL, LDLDIRECT in the last 72 hours. Thyroid Function Tests: No results for input(s): TSH, T4TOTAL, FREET4, T3FREE, THYROIDAB in the last 72 hours. Anemia Panel: Recent Labs    01/21/21 2124  FERRITIN 26  TIBC 459*  IRON 60   Sepsis Labs: No results for input(s): PROCALCITON, LATICACIDVEN in the last 168 hours.  Recent Results (from the past 240 hour(s))  Resp Panel by RT-PCR (Flu A&B, Covid) Nasopharyngeal Swab     Status: None   Collection Time: 01/21/21 10:51 AM   Specimen: Nasopharyngeal Swab; Nasopharyngeal(NP) swabs in vial transport medium  Result Value Ref Range Status   SARS Coronavirus 2 by RT PCR NEGATIVE NEGATIVE Final    Comment: (NOTE) SARS-CoV-2 target nucleic acids are NOT DETECTED.  The SARS-CoV-2 RNA is generally detectable in upper respiratory specimens during the acute phase of infection. The lowest concentration of SARS-CoV-2 viral copies this assay can detect is 138 copies/mL. A negative result does not preclude SARS-Cov-2 infection and should not be used as the sole basis for treatment or other patient management decisions. A negative result may occur with  improper specimen collection/handling,  submission of specimen other than nasopharyngeal swab, presence of viral mutation(s) within the areas targeted by this assay, and inadequate number of viral copies(<138 copies/mL). A negative result must be combined with clinical observations, patient history, and epidemiological information. The expected result is Negative.  Fact Sheet for Patients:  EntrepreneurPulse.com.au  Fact Sheet for Healthcare Providers:  IncredibleEmployment.be  This test is no t yet approved or cleared by the [place] FDA and  has been authorized for detection and/or diagnosis of SARS-CoV-2 by FDA under an Emergency Use Authorization (EUA). This EUA will remain  in effect (meaning this test can be used) for the duration of the COVID-19 declaration under Section 564(b)(1) of the Act, 21 U.S.C.section 360bbb-3(b)(1), unless the authorization is terminated  or revoked sooner.       Influenza A by PCR NEGATIVE NEGATIVE Final   Influenza B by PCR  NEGATIVE NEGATIVE Final    Comment: (NOTE) The Xpert Xpress SARS-CoV-2/FLU/RSV plus assay is intended as an aid in the diagnosis of influenza from Nasopharyngeal swab specimens and should not be used as a sole basis for treatment. Nasal washings and aspirates are unacceptable for Xpert Xpress SARS-CoV-2/FLU/RSV testing.  Fact Sheet for Patients: EntrepreneurPulse.com.au  Fact Sheet for Healthcare Providers: IncredibleEmployment.be  This test is not yet approved or cleared by the [place] FDA and has been authorized for detection and/or diagnosis of SARS-CoV-2 by FDA under an Emergency Use Authorization (EUA). This EUA will remain in effect (meaning this test can be used) for the duration of the COVID-19 declaration under Section 564(b)(1) of the Act, 21 U.S.C. section 360bbb-3(b)(1), unless the authorization is terminated or revoked.  Performed at [facility], [place] [address], [place], [place] [address]          Radiology Studies: DG Abd 2 Views  Result Date: 01/22/2021 CLINICAL DATA:  Left lower quadrant and left flank pain as well as diarrhea for 4 days. EXAM: ABDOMEN - 2 VIEW COMPARISON:  None. FINDINGS: No bowel dilation to suggest obstruction. No bowel air-fluid levels. No evidence of adynamic ileus. No free air. Abdominal and pelvic soft tissues are unremarkable. There is mild increased colonic stool burden. Stomach appears to the moderately distended. Skeletal structures are unremarkable. IMPRESSION: 1. No acute findings. No evidence of bowel obstruction, generalized adynamic ileus or free air. 2. Mild increase in colonic stool burden. Stomach moderately distended. Electronically Signed   By: Lajean Manes M.D.   On: 01/22/2021 20:03        Scheduled Meds: . aspirin EC  81 mg Oral Daily  . atorvastatin  40 mg Oral Daily  . atovaquone  1,500 mg Oral Q breakfast  . enoxaparin (LOVENOX) injection  40 mg Subcutaneous Q24H  . feeding supplement  237 mL Oral TID BM  . furosemide  40 mg Intravenous BID  . guaiFENesin  600 mg Oral BID  . isosorbide-hydrALAZINE  2 tablet Oral TID  . nicotine  14 mg Transdermal Daily  . polyethylene glycol  17 g Oral Daily  . sodium chloride flush  3 mL Intravenous Q12H   Continuous Infusions: . sodium chloride       LOS: 2 days    Time spent: 35 minutes.     Elmarie Shiley, MD [facility]   If 7PM-7AM, please contact night-coverage [email or web address]  01/23/2021, 3:36 PM

## 2021-01-24 DIAGNOSIS — I509 Heart failure, unspecified: Secondary | ICD-10-CM | POA: Diagnosis not present

## 2021-01-24 DIAGNOSIS — I5041 Acute combined systolic (congestive) and diastolic (congestive) heart failure: Secondary | ICD-10-CM | POA: Diagnosis not present

## 2021-01-24 LAB — BASIC METABOLIC PANEL
Anion gap: 11 (ref 5–15)
BUN: 38 mg/dL — ABNORMAL HIGH (ref 6–20)
CO2: 26 mmol/L (ref 22–32)
Calcium: 9.6 mg/dL (ref 8.9–10.3)
Chloride: 98 mmol/L (ref 98–111)
Creatinine, Ser: 1.09 mg/dL — ABNORMAL HIGH (ref 0.44–1.00)
GFR, Estimated: >60 mL/min (ref >60)
Glucose, Bld: 108 mg/dL — ABNORMAL HIGH (ref 70–99)
Potassium: 3.8 mmol/L (ref 3.5–5.1)
Sodium: 135 mmol/L (ref 135–145)

## 2021-01-24 MED ORDER — FUROSEMIDE 10 MG/ML IJ SOLN
40.0000 mg | Freq: Once | INTRAMUSCULAR | Status: AC
  Administered 2021-01-24: 40 mg via INTRAVENOUS
  Filled 2021-01-24: qty 4

## 2021-01-24 MED ORDER — POTASSIUM CHLORIDE CRYS ER 20 MEQ PO TBCR
40.0000 meq | EXTENDED_RELEASE_TABLET | Freq: Every day | ORAL | Status: DC
  Administered 2021-01-24 – 2021-01-27 (×4): 40 meq via ORAL
  Filled 2021-01-24 (×4): qty 2

## 2021-01-24 MED ORDER — PANTOPRAZOLE SODIUM 40 MG PO TBEC
40.0000 mg | DELAYED_RELEASE_TABLET | Freq: Every day | ORAL | Status: DC
  Administered 2021-01-24 – 2021-01-27 (×4): 40 mg via ORAL
  Filled 2021-01-24 (×5): qty 1

## 2021-01-24 MED ORDER — SENNOSIDES-DOCUSATE SODIUM 8.6-50 MG PO TABS
1.0000 | ORAL_TABLET | Freq: Two times a day (BID) | ORAL | Status: DC
  Administered 2021-01-24 – 2021-01-26 (×6): 1 via ORAL
  Filled 2021-01-24 (×6): qty 1

## 2021-01-24 MED ORDER — FUROSEMIDE 10 MG/ML IJ SOLN
80.0000 mg | Freq: Two times a day (BID) | INTRAMUSCULAR | Status: DC
  Administered 2021-01-24 – 2021-01-25 (×2): 80 mg via INTRAVENOUS
  Filled 2021-01-24 (×2): qty 8

## 2021-01-24 MED ORDER — SORBITOL 70 % SOLN
30.0000 mL | Freq: Every day | Status: DC | PRN
  Administered 2021-01-24: 30 mL via ORAL
  Filled 2021-01-24 (×2): qty 30

## 2021-01-24 NOTE — Progress Notes (Signed)
Cardiology Progress Note  Patient ID: Jane Estrada MRN: [id number] DOB: 08/03/1976 Date of Encounter: 01/24/2021  Primary Cardiologist: Jenkins Rouge, MD  Subjective   Chief Complaint: Short of breath.  HPI: Refusing all heart medications other than Lasix.  Still volume up.  ROS:  All other ROS reviewed and negative. Pertinent positives noted in the HPI.     Inpatient Medications  Scheduled Meds: . aspirin EC  81 mg Oral Daily  . atorvastatin  40 mg Oral Daily  . atovaquone  1,500 mg Oral Q breakfast  . enoxaparin (LOVENOX) injection  40 mg Subcutaneous Q24H  . feeding supplement  237 mL Oral TID BM  . furosemide  40 mg Intravenous Once  . furosemide  80 mg Intravenous BID  . guaiFENesin  600 mg Oral BID  . isosorbide-hydrALAZINE  2 tablet Oral TID  . nicotine  14 mg Transdermal Daily  . polyethylene glycol  17 g Oral Daily  . potassium chloride  40 mEq Oral Daily  . sodium chloride flush  3 mL Intravenous Q12H   Continuous Infusions: . sodium chloride     PRN Meds: sodium chloride, acetaminophen, ipratropium-albuterol, labetalol, nitroGLYCERIN, ondansetron (ZOFRAN) IV, sodium chloride flush, traMADol   Vital Signs   Vitals:   01/23/21 1930 01/23/21 1931 01/23/21 1935 01/23/21 2047  BP: (!) 173/132  (!) 165/133   Pulse: 100  100   Resp:    20  Temp:  98.5 F (36.9 C)    TempSrc:  Oral    SpO2: 99%  99%   Weight:      Height:        Intake/Output Summary (Last 24 hours) at 01/24/2021 0910 Last data filed at 01/24/2021 0900 Gross per 24 hour  Intake 1074 ml  Output 1650 ml  Net -576 ml   Last 3 Weights 01/22/2021 01/21/2021 12/29/2020  Weight (lbs) 112 lb 7 oz 108 lb 108 lb 3.2 oz  Weight (kg) 51 kg 48.988 kg 49.079 kg      Telemetry  Overnight telemetry shows sinus tachycardia heart rate around 100, which I personally reviewed.   ECG  The most recent ECG shows LVH with secondary repolarization abnormality, which I personally reviewed.   Physical Exam    Vitals:   01/23/21 1930 01/23/21 1931 01/23/21 1935 01/23/21 2047  BP: (!) 173/132  (!) 165/133   Pulse: 100  100   Resp:    20  Temp:  98.5 F (36.9 C)    TempSrc:  Oral    SpO2: 99%  99%   Weight:      Height:         Intake/Output Summary (Last 24 hours) at 01/24/2021 0910 Last data filed at 01/24/2021 0900 Gross per 24 hour  Intake 1074 ml  Output 1650 ml  Net -576 ml    Last 3 Weights 01/22/2021 01/21/2021 12/29/2020  Weight (lbs) 112 lb 7 oz 108 lb 108 lb 3.2 oz  Weight (kg) 51 kg 48.988 kg 49.079 kg    Body mass index is 19.3 kg/m.   General: Well nourished, well developed, in no acute distress Head: Atraumatic, normal size  Eyes: PEERLA, EOMI  Neck: Supple, JVD 12 to 15 cm of water Endocrine: No thryomegaly Cardiac: Normal S1, S2; S3 Lungs: Clear to auscultation bilaterally, no wheezing, rhonchi or rales  Abd: Soft, nontender, no hepatomegaly  Ext: No edema, pulses 2+ Musculoskeletal: No deformities, BUE and BLE strength normal and equal Skin: Warm and dry, no rashes  Neuro: Alert and oriented to person, place, time, and situation, CNII-XII grossly intact, no focal deficits  Psych: Normal mood and affect   Labs  High Sensitivity Troponin:   Recent Labs  Lab 01/21/21 1047 01/21/21 1251  TROPONINIHS 63* 64*     Cardiac EnzymesNo results for input(s): TROPONINI in the last 168 hours. No results for input(s): TROPIPOC in the last 168 hours.  Chemistry Recent Labs  Lab 01/21/21 1047 01/22/21 0344 01/23/21 0637 01/24/21 0149  NA 138 136 136 135  K 4.2 3.9 3.4* 3.8  CL 107 104 103 98  CO2 19* 20* 24 26  GLUCOSE 112* 109* 97 108*  BUN 25* 35* 41* 38*  CREATININE 1.18* 1.31* 1.32* 1.09*  CALCIUM 9.0 9.0 8.7* 9.6  PROT 7.1  --   --   --   ALBUMIN 3.6  --   --   --   AST 35  --   --   --   ALT 16  --   --   --   ALKPHOS 38  --   --   --   BILITOT 1.1  --   --   --   GFRNONAA 59* 52* 51* >60  ANIONGAP 12 12 9 11     Hematology Recent Labs  Lab  01/21/21 1047 01/22/21 0344  WBC 1.9* 1.9*  RBC 3.16* 3.10*  HGB 8.6* 8.2*  HCT 27.6* 27.0*  MCV 87.3 87.1  MCH 27.2 26.5  MCHC 31.2 30.4  RDW 15.8* 15.6*  PLT 106* 110*   BNP Recent Labs  Lab 01/21/21 1047  BNP 3,795.6*    DDimer No results for input(s): DDIMER in the last 168 hours.   Radiology  DG Abd 2 Views  Result Date: 01/22/2021 CLINICAL DATA:  Left lower quadrant and left flank pain as well as diarrhea for 4 days. EXAM: ABDOMEN - 2 VIEW COMPARISON:  None. FINDINGS: No bowel dilation to suggest obstruction. No bowel air-fluid levels. No evidence of adynamic ileus. No free air. Abdominal and pelvic soft tissues are unremarkable. There is mild increased colonic stool burden. Stomach appears to the moderately distended. Skeletal structures are unremarkable. IMPRESSION: 1. No acute findings. No evidence of bowel obstruction, generalized adynamic ileus or free air. 2. Mild increase in colonic stool burden. Stomach moderately distended. Electronically Signed   By: Lajean Manes M.D.   On: 01/22/2021 20:03    Cardiac Studies  TTE 12/22/2020 1. Left ventricular ejection fraction, by estimation, is 20%. The left  ventricle has severely decreased function. The left ventricle demonstrates  global hypokinesis. The left ventricular internal cavity size was severely  dilated. There is mild left  ventricular hypertrophy. Left ventricular diastolic parameters are  consistent with Grade II diastolic dysfunction (pseudonormalization).  Elevated left ventricular end-diastolic pressure.  2. Right ventricular systolic function is normal. The right ventricular  size is normal.  3. Left atrial size was mildly dilated.  4. The mitral valve is normal in structure. Mild mitral valve  regurgitation. No evidence of mitral stenosis.  5. The aortic valve is normal in structure. Aortic valve regurgitation is  not visualized. No aortic stenosis is present.  6. The inferior vena cava is normal  in size with greater than 50%  respiratory variability, suggesting right atrial pressure of 3 mmHg.   Patient Profile  Jane Estrada is a 45 y.o. female with nonischemic or cardiomyopathy, hypertension, polysubstance abuse who was admitted on 06/06/349 for acute systolic heart failure.  Assessment & Plan  1.  Acute on chronic systolic heart failure -Admitted with hypertensive crisis shortness of breath.  She has elevated JVD.  She is refusing all cardiac medications other than Lasix.  This is well-documented. -Still volume up.  I increased her Lasix to 80 mg IV twice daily.  Likely will transition to oral in the next day or 2. -I had extensive discussion with her.  She is refusing beta-blockers/ACE/ARB/Arni/MRA's.  She she reports she would not like to take any heart 5 medications other than Lasix. -I have very little to offer her.  We will continue with Lasix. -She is not a candidate for any advanced therapy.  No ICD.  For questions or updates, please contact [place] Please consult [email or web address] for contact info under   Time Spent with Patient: I have spent a total of 25 minutes with patient reviewing hospital notes, telemetry, EKGs, labs and examining the patient as well as establishing an assessment and plan that was discussed with the patient.  > 50% of time was spent in direct patient care.    Signed, [place] Naegeli. Audie Box, [place]  01/24/2021 9:10 AM

## 2021-01-24 NOTE — Progress Notes (Signed)
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 Stopped taking all her heart failure medications 3 weeks prior to admission, see related information available heart failure medications and feels like risk outweighs benefit of taking the medication. Presented with heaviness in her chest this morning.   Patient in the ED, BNP 3795, troponin 63, chest x-ray no acute finding.  Is admitted for acute on chronic heart failure exacerbation management.  Assessment & Plan:   Active Problems:   Chronic systolic CHF (congestive heart failure) (HCC)   CHF (congestive heart failure) (HCC)   1-Acute on Chronic Combined Systolic and Diastolic heart failure decompensation -Likely from noncompliance with medications.  -Continue with IV Lasix, dose increase today.  -Appreciate cardiology evaluation -Refuse Heart failure medication, other than Lasix.  -Negative 1.9 L   2-Asthma: Continue with nebulizer  3-HIV noncompliant with Medications.  Patient refused to take any medication Continue with atovaquone  4-Current smoker: Continue with nicotine patch  5-Pancytopenia ; likely related to HIV  6-Non-anion gap metabolic acidosis: Resolved.   7-CKD stage II: Monitor renal function and Lasix. Stable.   8-Severe protein caloric malnutrition: Continue with protein Ensure 9-Abdominal Pain, epigastric Flank Pain; UA negative, KUB showed colonic stool burden.  Started  Miralax.  Added senna, PPI  10-hypokalemia; replaced.   Estimated body mass index is 19.3 kg/m as calculated from the  following:   Height as of this encounter: 5\' 4"  (1.626 m).   Weight as of this encounter: 51 kg.   DVT prophylaxis: Lovenox Code Status: DNR Family Communication:Care discussed with patient Disposition Plan:  Status is: Inpatient  Remains inpatient appropriate because:IV treatments appropriate due to intensity of illness or inability to take PO   Dispo: The patient is from: Home              Anticipated d/c is to: Home              Anticipated d/c date is: 3 days              Patient currently is not medically stable to d/c.   Difficult to place patient No        Consultants:   Cardiology   Procedures:   none  Antimicrobials:    Subjective: She report dyspnea on and off, at rest and on exertion.  Complained of epigastric abdominal pain.   Objective: Vitals:   01/23/21 1930 01/23/21 1931 01/23/21 1935 01/23/21 2047  BP: (!) 173/132  (!) 165/133   Pulse: 100  100   Resp:    20  Temp:  98.5 F (36.9 C)    TempSrc:  Oral    SpO2: 99%  99%   Weight:      Height:        Intake/Output Summary (Last 24 hours) at 01/24/2021 1354 Last data filed at 01/24/2021 1000 Gross per 24 hour  Intake 717 ml  Output 1850 ml  Net -1133 ml   Filed Weights   01/21/21 1024 01/22/21 0504  Weight: 49 kg 51 kg    Examination:  General exam: NAD Respiratory system: B/L Crackles.  Cardiovascular system: S 1,  S 2 RRR Gastrointestinal system: BS present, soft, nt Central nervous system: Alert, oriented Extremities: plus 1 edema  Data Reviewed: I have personally reviewed following labs and imaging studies  CBC: Recent Labs  Lab 01/21/21 1047 01/22/21 0344  WBC 1.9* 1.9*  NEUTROABS 1.4*  --   HGB 8.6* 8.2*  HCT 27.6* 27.0*  MCV 87.3 87.1  PLT 106* 237*   Basic Metabolic Panel: Recent Labs  Lab 01/21/21 1047 01/22/21 0344 01/23/21 0637 01/24/21 0149  NA 138 136 136 135  K 4.2 3.9 3.4* 3.8  CL 107 104 103 98  CO2 19* 20* 24 26  GLUCOSE 112* 109* 97 108*   BUN 25* 35* 41* 38*  CREATININE 1.18* 1.31* 1.32* 1.09*  CALCIUM 9.0 9.0 8.7* 9.6   GFR: Estimated Creatinine Clearance: 53.6 mL/min (A) (by C-G formula based on SCr of 1.09 mg/dL (H)). Liver Function Tests: Recent Labs  Lab 01/21/21 1047  AST 35  ALT 16  ALKPHOS 38  BILITOT 1.1  PROT 7.1  ALBUMIN 3.6   Recent Labs  Lab 01/21/21 1047  LIPASE 37   No results for input(s): AMMONIA in the last 168 hours. Coagulation Profile: No results for input(s): INR, PROTIME in the last 168 hours. Cardiac Enzymes: No results for input(s): CKTOTAL, CKMB, CKMBINDEX, TROPONINI in the last 168 hours. BNP (last 3 results) No results for input(s): PROBNP in the last 8760 hours. HbA1C: No results for input(s): HGBA1C in the last 72 hours. CBG: No results for input(s): GLUCAP in the last 168 hours. Lipid Profile: No results for input(s): CHOL, HDL, LDLCALC, TRIG, CHOLHDL, LDLDIRECT in the last 72 hours. Thyroid Function Tests: No results for input(s): TSH, T4TOTAL, FREET4, T3FREE, THYROIDAB in the last 72 hours. Anemia Panel: Recent Labs    01/21/21 2124  FERRITIN 26  TIBC 459*  IRON 60   Sepsis Labs: No results for input(s): PROCALCITON, LATICACIDVEN in the last 168 hours.  Recent Results (from the past 240 hour(s))  Resp Panel by RT-PCR (Flu A&B, Covid) Nasopharyngeal Swab     Status: None   Collection Time: 01/21/21 10:51 AM   Specimen: Nasopharyngeal Swab; Nasopharyngeal(NP) swabs in vial transport medium  Result Value Ref Range Status   SARS Coronavirus 2 by RT PCR NEGATIVE NEGATIVE Final    Comment: (NOTE) SARS-CoV-2 target nucleic acids are NOT DETECTED.  The SARS-CoV-2 RNA is generally detectable in upper respiratory specimens during the acute phase of infection. The lowest concentration of SARS-CoV-2 viral copies this assay can detect is 138 copies/mL. A negative result does not preclude SARS-Cov-2 infection and should not be used as the sole basis for treatment  or other patient management decisions. A negative result may occur with  improper specimen collection/handling, submission of specimen other than nasopharyngeal swab, presence of viral mutation(s) within the areas targeted by this assay, and inadequate number of viral copies(<138 copies/mL). A negative result must be combined with clinical observations, patient history, and epidemiological information. The expected result is Negative.  Fact Sheet for Patients:  EntrepreneurPulse.com.au  Fact Sheet for Healthcare Providers:  IncredibleEmployment.be  This test is no t yet approved or cleared by the [place] FDA and  has been authorized for detection and/or diagnosis of SARS-CoV-2 by FDA under an Emergency Use Authorization (EUA). This EUA will remain  in effect (meaning this test can be used) for the duration of the COVID-19 declaration under Section 564(b)(1) of the Act, 21 U.S.C.section 360bbb-3(b)(1), unless the authorization is terminated  or revoked sooner.  Influenza A by PCR NEGATIVE NEGATIVE Final   Influenza B by PCR NEGATIVE NEGATIVE Final    Comment: (NOTE) The Xpert Xpress SARS-CoV-2/FLU/RSV plus assay is intended as an aid in the diagnosis of influenza from Nasopharyngeal swab specimens and should not be used as a sole basis for treatment. Nasal washings and aspirates are unacceptable for Xpert Xpress SARS-CoV-2/FLU/RSV testing.  Fact Sheet for Patients: EntrepreneurPulse.com.au  Fact Sheet for Healthcare Providers: IncredibleEmployment.be  This test is not yet approved or cleared by the [place] FDA and has been authorized for detection and/or diagnosis of SARS-CoV-2 by FDA under an Emergency Use Authorization (EUA). This EUA will remain in effect (meaning this test can be used) for the duration of the COVID-19 declaration under Section 564(b)(1) of the Act, 21 U.S.C. section  360bbb-3(b)(1), unless the authorization is terminated or revoked.  Performed at [facility], [place] [address], [place], [place] [address]          Radiology Studies: DG Abd 2 Views  Result Date: 01/22/2021 CLINICAL DATA:  Left lower quadrant and left flank pain as well as diarrhea for 4 days. EXAM: ABDOMEN - 2 VIEW COMPARISON:  None. FINDINGS: No bowel dilation to suggest obstruction. No bowel air-fluid levels. No evidence of adynamic ileus. No free air. Abdominal and pelvic soft tissues are unremarkable. There is mild increased colonic stool burden. Stomach appears to the moderately distended. Skeletal structures are unremarkable. IMPRESSION: 1. No acute findings. No evidence of bowel obstruction, generalized adynamic ileus or free air. 2. Mild increase in colonic stool burden. Stomach moderately distended. Electronically Signed   By: Lajean Manes M.D.   On: 01/22/2021 20:03        Scheduled Meds: . aspirin EC  81 mg Oral Daily  . atorvastatin  40 mg Oral Daily  . atovaquone  1,500 mg Oral Q breakfast  . enoxaparin (LOVENOX) injection  40 mg Subcutaneous Q24H  . feeding supplement  237 mL Oral TID BM  . furosemide  80 mg Intravenous BID  . guaiFENesin  600 mg Oral BID  . isosorbide-hydrALAZINE  2 tablet Oral TID  . nicotine  14 mg Transdermal Daily  . pantoprazole  40 mg Oral Daily  . polyethylene glycol  17 g Oral Daily  . potassium chloride  40 mEq Oral Daily  . senna-docusate  1 tablet Oral BID  . sodium chloride flush  3 mL Intravenous Q12H   Continuous Infusions: . sodium chloride       LOS: 3 days    Time spent: 35 minutes.     Elmarie Shiley, MD [facility]   If 7PM-7AM, please contact night-coverage [email or web address]  01/24/2021, 1:54 PM

## 2021-01-24 NOTE — Progress Notes (Signed)
Blood pressure of 183/133 treated with prn labetalol, resulting BP 143/108.  Patient c/o nausea and light headedness, stated she would not take that medication again.

## 2021-01-25 DIAGNOSIS — B20 Human immunodeficiency virus [HIV] disease: Secondary | ICD-10-CM | POA: Diagnosis not present

## 2021-01-25 DIAGNOSIS — I5022 Chronic systolic (congestive) heart failure: Secondary | ICD-10-CM | POA: Diagnosis not present

## 2021-01-25 DIAGNOSIS — I509 Heart failure, unspecified: Secondary | ICD-10-CM | POA: Diagnosis not present

## 2021-01-25 DIAGNOSIS — L8990 Pressure ulcer of unspecified site, unspecified stage: Secondary | ICD-10-CM | POA: Insufficient documentation

## 2021-01-25 LAB — BASIC METABOLIC PANEL
Anion gap: 10 (ref 5–15)
BUN: 37 mg/dL — ABNORMAL HIGH (ref 6–20)
CO2: 25 mmol/L (ref 22–32)
Calcium: 9.6 mg/dL (ref 8.9–10.3)
Chloride: 97 mmol/L — ABNORMAL LOW (ref 98–111)
Creatinine, Ser: 1.11 mg/dL — ABNORMAL HIGH (ref 0.44–1.00)
GFR, Estimated: >60 mL/min (ref >60)
Glucose, Bld: 98 mg/dL (ref 70–99)
Potassium: 4.2 mmol/L (ref 3.5–5.1)
Sodium: 132 mmol/L — ABNORMAL LOW (ref 135–145)

## 2021-01-25 MED ORDER — TORSEMIDE 20 MG PO TABS
20.0000 mg | ORAL_TABLET | Freq: Every day | ORAL | Status: DC
  Administered 2021-01-26 – 2021-01-27 (×2): 20 mg via ORAL
  Filled 2021-01-25 (×2): qty 1

## 2021-01-25 NOTE — TOC Initial Note (Signed)
Transition of Care [facility]) - Initial/Assessment Note    Patient Details  Name: Jane Estrada MRN: [id number] Date of Birth: 03-Feb-1976  Transition of Care [facility] CM/SW Contact:    Bethena Roys, RN Phone Number: 01/25/2021, 2:49 PM  Clinical Narrative: Patient presented for weakness and fatigue. Prior to arrival patient was from a motel ([employer]) that was set up by Apolonio Schneiders at the [employer] Patient was staying with a roommate and the patient was asked not to return. Per patient her belongings are at the storage building.  Patient states she has a housing voucher that she can use once she is stable to transition from the hospital. Case Manager asked her to call the Social Worker at [employer] to confirm this information. Patient has relocated to [facility] from [place]- she has been here since April of 2021 and needs to get her Medicaid switched from [place] to [place]. Patient uses [facility] to get all of her medications that she takes. Patient sates she will not take everything that is prescribed. Medications are at no cost to this patient. CAGE AID tool completed and the patient declined substance abuse resources. Case Manager will continue to follow for additional toc needs.                    Expected Discharge Plan: Home/Self Care Barriers to Discharge: Continued Medical Work up   Patient Goals and CMS Choice Patient states their goals for this hospitalization and ongoing recovery are:: Pt states she will not return to a shelter. She has housing voucher that she will try to use. States she has a case worker that has been assisting at the [place] [facility] with getting Medicaid changed to [place].   Choice offered to / list presented to : NA  Expected Discharge Plan and Services Expected Discharge Plan: Home/Self Care In-house Referral: NA Discharge Planning Services: CM Consult Post Acute Care Choice: NA Living arrangements  for the past 2 months: Hotel/Motel (patient states she was asked to leave the Motel and cannot return.)                 DME Arranged: N/A   HH Arranged: NA    Prior Living Arrangements/Services Living arrangements for the past 2 months: Hotel/Motel (patient states she was asked to leave the [place] [facility] and cannot return.) Lives with:: Self,Roommate (Unable to return) Patient language and need for interpreter reviewed:: Yes Do you feel safe going back to the place where you live?: Yes      Need for Family Participation in Patient Care: No (Comment) Care giver support system in place?: No (comment)   Criminal Activity/Legal Involvement Pertinent to Current Situation/Hospitalization: No - Comment as needed  Activities of Daily Living Home Assistive Devices/Equipment: None ADL Screening (condition at time of admission) Patient's cognitive ability adequate to safely complete daily activities?: Yes Is the patient deaf or have difficulty hearing?: No Does the patient have difficulty seeing, even when wearing glasses/contacts?: No Does the patient have difficulty concentrating, remembering, or making decisions?: No Patient able to express need for assistance with ADLs?: Yes Does the patient have difficulty dressing or bathing?: No Independently performs ADLs?: Yes (appropriate for developmental age) Does the patient have difficulty walking or climbing stairs?: No Weakness of Legs: None Weakness of Arms/Hands: None  Permission Sought/Granted Permission sought to share information with : Case Manager,PCP (PCP is at the [facility])    Emotional Assessment Appearance:: Appears stated age Attitude/Demeanor/Rapport:  Engaged Affect (typically observed): Appropriate Orientation: : Oriented to Situation,Oriented to  Time,Oriented to Place,Oriented to Self Alcohol / Substance Use: Other (comment) (Patient states she uses marijuana) Psych Involvement: No (comment)  Admission diagnosis:  CHF (congestive  heart failure) (HCC) [I50.9] Dyspnea, unspecified type [R06.00] Acute on chronic congestive heart failure, unspecified heart failure type ([place]) [I50.9] HIV infection, unspecified symptom status ([place]) [B20] Patient Active Problem List   Diagnosis Date Noted  . CHF (congestive heart failure) ([place]) 01/21/2021  . Shortness of breath 12/21/2020  . Thrush 12/21/2020  . Need for pneumocystis prophylaxis 12/21/2020  . Heart failure ([place])   . Chest pain 03/15/2020  . Polysubstance abuse ([place]) 03/15/2020  . Pancytopenia ([place]) 03/15/2020  . 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 Tobacco dependence   . Hypertension   . HIV (human immunodeficiency virus infection) ([place])   . Granular cell tumor   . Chronic systolic CHF (congestive heart failure) ([place])   . Bipolar affective ([place])   . History of myocardial infarction 2006   PCP:  Placey, Audrea Muscat, NP Pharmacy:   [id number] [place], [place] [address] [place] [address] Phone: [phone] Fax: [phone]  [facility] [place], [place] [address] [place] [address] Phone: [phone] Fax: [phone]  Readmission Risk Interventions Readmission Risk Prevention Plan 01/25/2021  Transportation Screening Complete  PCP or Specialist Appt within 3-5 Days Complete  HRI or Home Care Consult Complete  Palliative Care Screening Not Applicable  Medication Review (RN Care Manager) Complete

## 2021-01-25 NOTE — Progress Notes (Signed)
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 Stopped taking all her heart failure medications 3 weeks prior to admission, see related information available heart failure medications and feels like risk outweighs benefit of taking the medication. Presented with heaviness in her chest this morning.   Patient in the ED, BNP 3795, troponin 63, chest x-ray no acute finding.  Is admitted for acute on chronic heart failure exacerbation management.  Assessment & Plan:   Active Problems:   Chronic systolic CHF (congestive heart failure) (HCC)   CHF (congestive heart failure) (HCC)   Pressure injury of skin   1-Acute on Chronic Combined Systolic and Diastolic heart failure decompensation -Likely from noncompliance with medications.  -Treated  with IV Lasix, transition to torsemide today.  -Appreciate cardiology evaluation -Refuse Heart failure medication, other than Lasix.  -Negative 2.8 L -Discharge in 24 hour if stable on oral diuretics.   2-Asthma: Continue with nebulizer  3-HIV noncompliant with Medications.  Patient refused to take any medication Continue with atovaquone  4-Current smoker: Continue with nicotine patch  5-Pancytopenia ; likely related to HIV  6-Non-anion gap metabolic acidosis: Resolved.   7-CKD stage II: Monitor renal function and Lasix. Stable.   8-Severe protein caloric malnutrition: Continue with protein Ensure 9-Abdominal Pain, epigastric Flank Pain; UA negative, KUB showed colonic stool burden.  Started  Miralax.  Continue with  senna, PPI   Denies abdominal pain.   10-hypokalemia; replaced.   Estimated body mass index is 19.3 kg/m as calculated from the following:   Height as of this encounter: 5\' 4"  (1.626 m).   Weight as of this encounter: 51 kg.   DVT prophylaxis: Lovenox Code Status: DNR Family Communication:Care discussed with patient Disposition Plan:  Status is: Inpatient  Remains inpatient appropriate because:IV treatments appropriate due to intensity of illness or inability to take PO   Dispo: The patient is from: Home              Anticipated d/c is to: Home              Anticipated d/c date is: 1 day              Patient currently is not medically stable to d/c. discharge 2/16 if stable.    Difficult to place patient No        Consultants:   Cardiology   Procedures:   none  Antimicrobials:    Subjective: She report breathing ok, complaints of headaches.   Objective: Vitals:   01/24/21 1820 01/24/21 1929 01/25/21 0853 01/25/21 1341  BP: (!) 140/114 (!) 152/116 (!) 151/112   Pulse: 86  94 97  Resp:  18 16   Temp:  98.5 F (36.9 C) 98.7 F (37.1 C)   TempSrc:  Oral Oral   SpO2: 100% 100% 95% 100%  Weight:      Height:        Intake/Output Summary (Last 24 hours) at 01/25/2021 1618 Last data filed at 01/25/2021 1500 Gross per 24 hour  Intake 480 ml  Output 1400 ml  Net -920 ml   Filed Weights   01/21/21 1024 01/22/21 0504  Weight: 49 kg 51 kg    Examination:  General exam: NAD Respiratory system: CTA Cardiovascular system: S 1, S 2 RRR Gastrointestinal system: BS present, soft nt Central nervous system: alert and oriented.  Extremities: Plus 1 edema  Data Reviewed: I have personally reviewed following labs and imaging studies  CBC: Recent Labs  Lab 01/21/21 1047 01/22/21 0344  WBC 1.9* 1.9*  NEUTROABS 1.4*  --   HGB 8.6* 8.2*  HCT 27.6* 27.0*  MCV 87.3 87.1  PLT 106* 035*   Basic Metabolic Panel: Recent Labs  Lab 01/21/21 1047 01/22/21 0344  01/23/21 0637 01/24/21 0149 01/25/21 0141  NA 138 136 136 135 132*  K 4.2 3.9 3.4* 3.8 4.2  CL 107 104 103 98 97*  CO2 19* 20* 24 26 25   GLUCOSE 112* 109* 97 108* 98  BUN 25* 35* 41* 38* 37*  CREATININE 1.18* 1.31* 1.32* 1.09* 1.11*  CALCIUM 9.0 9.0 8.7* 9.6 9.6   GFR: Estimated Creatinine Clearance: 52.6 mL/min (A) (by C-G formula based on SCr of 1.11 mg/dL (H)). Liver Function Tests: Recent Labs  Lab 01/21/21 1047  AST 35  ALT 16  ALKPHOS 38  BILITOT 1.1  PROT 7.1  ALBUMIN 3.6   Recent Labs  Lab 01/21/21 1047  LIPASE 37   No results for input(s): AMMONIA in the last 168 hours. Coagulation Profile: No results for input(s): INR, PROTIME in the last 168 hours. Cardiac Enzymes: No results for input(s): CKTOTAL, CKMB, CKMBINDEX, TROPONINI in the last 168 hours. BNP (last 3 results) No results for input(s): PROBNP in the last 8760 hours. HbA1C: No results for input(s): HGBA1C in the last 72 hours. CBG: No results for input(s): GLUCAP in the last 168 hours. Lipid Profile: No results for input(s): CHOL, HDL, LDLCALC, TRIG, CHOLHDL, LDLDIRECT in the last 72 hours. Thyroid Function Tests: No results for input(s): TSH, T4TOTAL, FREET4, T3FREE, THYROIDAB in the last 72 hours. Anemia Panel: No results for input(s): VITAMINB12, FOLATE, FERRITIN, TIBC, IRON, RETICCTPCT in the last 72 hours. Sepsis Labs: No results for input(s): PROCALCITON, LATICACIDVEN in the last 168 hours.  Recent Results (from the past 240 hour(s))  Resp Panel by RT-PCR (Flu A&B, Covid) Nasopharyngeal Swab     Status: None   Collection Time: 01/21/21 10:51 AM   Specimen: Nasopharyngeal Swab; Nasopharyngeal(NP) swabs in vial transport medium  Result Value Ref Range Status   SARS Coronavirus 2 by RT PCR NEGATIVE NEGATIVE Final    Comment: (NOTE) SARS-CoV-2 target nucleic acids are NOT DETECTED.  The SARS-CoV-2 RNA is generally detectable in upper respiratory specimens during the acute phase of  infection. The lowest concentration of SARS-CoV-2 viral copies this assay can detect is 138 copies/mL. A negative result does not preclude SARS-Cov-2 infection and should not be used as the sole basis for treatment or other patient management decisions. A negative result may occur with  improper specimen collection/handling, submission of specimen other than nasopharyngeal swab, presence of viral mutation(s) within the areas targeted by this assay, and inadequate number of viral copies(<138 copies/mL). A negative result must be combined with clinical observations, patient history, and epidemiological information. The expected result is Negative.  Fact Sheet for Patients:  EntrepreneurPulse.com.au  Fact Sheet for Healthcare Providers:  IncredibleEmployment.be  This test is no t yet approved or cleared by the [place] FDA and  has been authorized for detection and/or diagnosis of SARS-CoV-2 by FDA under an Emergency Use Authorization (EUA). This EUA will remain  in effect (meaning this  test can be used) for the duration of the COVID-19 declaration under Section 564(b)(1) of the Act, 21 U.S.C.section 360bbb-3(b)(1), unless the authorization is terminated  or revoked sooner.       Influenza A by PCR NEGATIVE NEGATIVE Final   Influenza B by PCR NEGATIVE NEGATIVE Final    Comment: (NOTE) The Xpert Xpress SARS-CoV-2/FLU/RSV plus assay is intended as an aid in the diagnosis of influenza from Nasopharyngeal swab specimens and should not be used as a sole basis for treatment. Nasal washings and aspirates are unacceptable for Xpert Xpress SARS-CoV-2/FLU/RSV testing.  Fact Sheet for Patients: EntrepreneurPulse.com.au  Fact Sheet for Healthcare Providers: IncredibleEmployment.be  This test is not yet approved or cleared by the [place] FDA and has been authorized for detection and/or diagnosis of SARS-CoV-2  by FDA under an Emergency Use Authorization (EUA). This EUA will remain in effect (meaning this test can be used) for the duration of the COVID-19 declaration under Section 564(b)(1) of the Act, 21 U.S.C. section 360bbb-3(b)(1), unless the authorization is terminated or revoked.  Performed at [facility], [place] [address] [place], [place] [address]          Radiology Studies: No results found.      Scheduled Meds: . aspirin EC  81 mg Oral Daily  . atorvastatin  40 mg Oral Daily  . atovaquone  1,500 mg Oral Q breakfast  . enoxaparin (LOVENOX) injection  40 mg Subcutaneous Q24H  . feeding supplement  237 mL Oral TID BM  . guaiFENesin  600 mg Oral BID  . isosorbide-hydrALAZINE  2 tablet Oral TID  . nicotine  14 mg Transdermal Daily  . pantoprazole  40 mg Oral Daily  . polyethylene glycol  17 g Oral Daily  . potassium chloride  40 mEq Oral Daily  . senna-docusate  1 tablet Oral BID  . sodium chloride flush  3 mL Intravenous Q12H  . torsemide  20 mg Oral Daily   Continuous Infusions: . sodium chloride       LOS: 4 days    Time spent: 35 minutes.     Elmarie Shiley, MD [facility]   If 7PM-7AM, please contact night-coverage [email or web address]  01/25/2021, 4:18 PM

## 2021-01-25 NOTE — Progress Notes (Signed)
Cardiology Progress Note  Patient ID: Jane Estrada MRN: [id number] DOB: 1976-06-08 Date of Encounter: 01/25/2021  Primary Cardiologist: Jenkins Rouge, MD  Subjective   Chief Complaint: Weakness and fatigue.  HPI: Jane Estrada on examination.  Still refusing her heart failure medications other than diuretics.  ROS:  All other ROS reviewed and negative. Pertinent positives noted in the HPI.     Inpatient Medications  Scheduled Meds: . aspirin EC  81 mg Oral Daily  . atorvastatin  40 mg Oral Daily  . atovaquone  1,500 mg Oral Q breakfast  . enoxaparin (LOVENOX) injection  40 mg Subcutaneous Q24H  . feeding supplement  237 mL Oral TID BM  . guaiFENesin  600 mg Oral BID  . isosorbide-hydrALAZINE  2 tablet Oral TID  . nicotine  14 mg Transdermal Daily  . pantoprazole  40 mg Oral Daily  . polyethylene glycol  17 g Oral Daily  . potassium chloride  40 mEq Oral Daily  . senna-docusate  1 tablet Oral BID  . sodium chloride flush  3 mL Intravenous Q12H  . torsemide  20 mg Oral Daily   Continuous Infusions: . sodium chloride     PRN Meds: sodium chloride, acetaminophen, ipratropium-albuterol, labetalol, nitroGLYCERIN, ondansetron (ZOFRAN) IV, sodium chloride flush, sorbitol, traMADol   Vital Signs   Vitals:   01/24/21 1740 01/24/21 1820 01/24/21 1929 01/25/21 0853  BP: (!) 143/108 (!) 140/114 (!) 152/116 (!) 151/112  Pulse: 86 86  94  Resp:   18 16  Temp:   98.5 F (36.9 C) 98.7 F (37.1 C)  TempSrc:   Oral Oral  SpO2: 100% 100% 100% 95%  Weight:      Height:        Intake/Output Summary (Last 24 hours) at 01/25/2021 0950 Last data filed at 01/24/2021 1000 Gross per 24 hour  Intake -  Output 200 ml  Net -200 ml   Last 3 Weights 01/22/2021 01/21/2021 12/29/2020  Weight (lbs) 112 lb 7 oz 108 lb 108 lb 3.2 oz  Weight (kg) 51 kg 48.988 kg 49.079 kg      Telemetry  Overnight telemetry shows sinus rhythm heart rate in the 90s, which I personally reviewed.   ECG  The most  recent ECG shows sinus tachycardia heart rate 110, LVH with repolarization abnormality, which I personally reviewed.   Physical Exam   Vitals:   01/24/21 1740 01/24/21 1820 01/24/21 1929 01/25/21 0853  BP: (!) 143/108 (!) 140/114 (!) 152/116 (!) 151/112  Pulse: 86 86  94  Resp:   18 16  Temp:   98.5 F (36.9 C) 98.7 F (37.1 C)  TempSrc:   Oral Oral  SpO2: 100% 100% 100% 95%  Weight:      Height:         Intake/Output Summary (Last 24 hours) at 01/25/2021 0950 Last data filed at 01/24/2021 1000 Gross per 24 hour  Intake -  Output 200 ml  Net -200 ml    Last 3 Weights 01/22/2021 01/21/2021 12/29/2020  Weight (lbs) 112 lb 7 oz 108 lb 108 lb 3.2 oz  Weight (kg) 51 kg 48.988 kg 49.079 kg    Body mass index is 19.3 kg/m.  General: Well nourished, well developed, in no acute distress Head: Atraumatic, normal size  Eyes: PEERLA, EOMI  Neck: Supple, no JVD Endocrine: No thryomegaly Cardiac: Normal S1, S2; RRR; no murmurs, rubs, or gallops Lungs: Clear to auscultation bilaterally, no wheezing, rhonchi or rales  Abd: Soft, nontender, no hepatomegaly  Ext: No edema, pulses 2+ Musculoskeletal: No deformities, BUE and BLE strength normal and equal Skin: Warm and dry, no rashes   Neuro: Alert and oriented to person, place, time, and situation, CNII-XII grossly intact, no focal deficits  Psych: Normal mood and affect   Labs  High Sensitivity Troponin:   Recent Labs  Lab 01/21/21 1047 01/21/21 1251  TROPONINIHS 63* 64*     Cardiac EnzymesNo results for input(s): TROPONINI in the last 168 hours. No results for input(s): TROPIPOC in the last 168 hours.  Chemistry Recent Labs  Lab 01/21/21 1047 01/22/21 0344 01/23/21 0637 01/24/21 0149 01/25/21 0141  NA 138   < > 136 135 132*  K 4.2   < > 3.4* 3.8 4.2  CL 107   < > 103 98 97*  CO2 19*   < > 24 26 25   GLUCOSE 112*   < > 97 108* 98  BUN 25*   < > 41* 38* 37*  CREATININE 1.18*   < > 1.32* 1.09* 1.11*  CALCIUM 9.0   < > 8.7*  9.6 9.6  PROT 7.1  --   --   --   --   ALBUMIN 3.6  --   --   --   --   AST 35  --   --   --   --   ALT 16  --   --   --   --   ALKPHOS 38  --   --   --   --   BILITOT 1.1  --   --   --   --   GFRNONAA 59*   < > 51* >60 >60  ANIONGAP 12   < > 9 11 10    < > = values in this interval not displayed.    Hematology Recent Labs  Lab 01/21/21 1047 01/22/21 0344  WBC 1.9* 1.9*  RBC 3.16* 3.10*  HGB 8.6* 8.2*  HCT 27.6* 27.0*  MCV 87.3 87.1  MCH 27.2 26.5  MCHC 31.2 30.4  RDW 15.8* 15.6*  PLT 106* 110*   BNP Recent Labs  Lab 01/21/21 1047  BNP 3,795.6*    DDimer No results for input(s): DDIMER in the last 168 hours.   Radiology  No results found.  Cardiac Studies  TTE 12/22/2020 1. Left ventricular ejection fraction, by estimation, is 20%. The left  ventricle has severely decreased function. The left ventricle demonstrates  global hypokinesis. The left ventricular internal cavity size was severely  dilated. There is mild left  ventricular hypertrophy. Left ventricular diastolic parameters are  consistent with Grade II diastolic dysfunction (pseudonormalization).  Elevated left ventricular end-diastolic pressure.  2. Right ventricular systolic function is normal. The right ventricular  size is normal.  3. Left atrial size was mildly dilated.  4. The mitral valve is normal in structure. Mild mitral valve  regurgitation. No evidence of mitral stenosis.  5. The aortic valve is normal in structure. Aortic valve regurgitation is  not visualized. No aortic stenosis is present.  6. The inferior vena cava is normal in size with greater than 50%  respiratory variability, suggesting right atrial pressure of 3 mmHg.   Patient Profile  Jane Estrada is a 45 y.o. female with nonischemic or cardiomyopathy, hypertension, polysubstance abuse who was admitted on 03/09/5187 for acute systolic heart failure.  Assessment & Plan   1.  Acute on chronic systolic heart  failure. -Admitted with hypertensive crisis, elevated BNP and clear chest x-ray.  Troponin negative.  She is refusing all cardiac medications other than diuretics. -Net -1.9 L since admission.  [name].  I would recommend to start torsemide 20 mg daily. -She is adamantly refusing all other cardiac medications.  This is well-documented.  She is not a candidate for advanced therapies or ICD given medical noncompliance. -Cardiology will sign off at this time.  Once she is closer to discharge we can arrange hospital follow-up as needed.  Given her lack of wanting to participate with cardiology unsure how beneficial this will be.  Hospice care is appropriate.  [facility] will sign off.   Medication Recommendations: Torsemide 20 mg daily, salt restricted diet Other recommendations (labs, testing, etc): None Follow up as an outpatient: None needed as she is noncompliant with cardiac care.  For questions or updates, please contact [place] Please consult [email or web address] for contact info under   Time Spent with Patient: I have spent a total of 25 minutes with patient reviewing hospital notes, telemetry, EKGs, labs and examining the patient as well as establishing an assessment and plan that was discussed with the patient.  > 50% of time was spent in direct patient care.    Signed, Addison Naegeli. Audie Box, [place]  01/25/2021 9:50 AM

## 2021-01-25 NOTE — TOC CAGE-AID Note (Signed)
Transition of Care [facility]) - CAGE-AID Screening   Patient Details  Name: Jane Estrada MRN: [id number] Date of Birth: 10-23-1976  [facility]) CM/SW Contact:    Bethena Roys, RN Phone Number: 01/25/2021, 4:19 PM   Clinical Narrative: CAGE AID tool completed and the patient declined substance abuse resources. CAGE-AID Screening: Substance Abuse Screening unable to be completed due to: : Patient Refused (Patient declined substance abuse resources.)  Have You Ever Felt You Ought to Cut Down on Your Drinking or Drug Use?: No Have People Annoyed You By Critizing Your Drinking Or Drug Use?: No Have You Felt Bad Or Guilty About Your Drinking Or Drug Use?: No Have You Ever Had a Drink or Used Drugs First Thing In The Morning to Steady Your Nerves or to Get Rid of a Hangover?: No CAGE-AID Score: 0

## 2021-01-26 ENCOUNTER — Other Ambulatory Visit (HOSPITAL_COMMUNITY): Payer: Self-pay | Admitting: Internal Medicine

## 2021-01-26 ENCOUNTER — Inpatient Hospital Stay (HOSPITAL_COMMUNITY): Payer: Medicaid - Out of State

## 2021-01-26 DIAGNOSIS — B20 Human immunodeficiency virus [HIV] disease: Secondary | ICD-10-CM | POA: Diagnosis not present

## 2021-01-26 DIAGNOSIS — I5022 Chronic systolic (congestive) heart failure: Secondary | ICD-10-CM | POA: Diagnosis not present

## 2021-01-26 DIAGNOSIS — I5041 Acute combined systolic (congestive) and diastolic (congestive) heart failure: Secondary | ICD-10-CM | POA: Diagnosis not present

## 2021-01-26 DIAGNOSIS — Z9119 Patient's noncompliance with other medical treatment and regimen: Secondary | ICD-10-CM

## 2021-01-26 DIAGNOSIS — D61818 Other pancytopenia: Secondary | ICD-10-CM

## 2021-01-26 DIAGNOSIS — J9601 Acute respiratory failure with hypoxia: Secondary | ICD-10-CM

## 2021-01-26 LAB — BASIC METABOLIC PANEL
Anion gap: 11 (ref 5–15)
BUN: 39 mg/dL — ABNORMAL HIGH (ref 6–20)
CO2: 24 mmol/L (ref 22–32)
Calcium: 9.7 mg/dL (ref 8.9–10.3)
Chloride: 98 mmol/L (ref 98–111)
Creatinine, Ser: 1.14 mg/dL — ABNORMAL HIGH (ref 0.44–1.00)
GFR, Estimated: >60 mL/min (ref >60)
Glucose, Bld: 131 mg/dL — ABNORMAL HIGH (ref 70–99)
Potassium: 4.1 mmol/L (ref 3.5–5.1)
Sodium: 133 mmol/L — ABNORMAL LOW (ref 135–145)

## 2021-01-26 MED ORDER — TORSEMIDE 20 MG PO TABS: 20.0000 mg | ORAL_TABLET | Freq: Every day | ORAL | 0 refills | Status: DC

## 2021-01-26 MED ORDER — ISOSORB DINITRATE-HYDRALAZINE 20-37.5 MG PO TABS: 2.0000 | ORAL_TABLET | Freq: Three times a day (TID) | ORAL | 0 refills | Status: DC

## 2021-01-26 MED ORDER — POTASSIUM CHLORIDE CRYS ER 20 MEQ PO TBCR: 20.0000 meq | EXTENDED_RELEASE_TABLET | Freq: Every day | ORAL | 0 refills | Status: DC

## 2021-01-26 MED ORDER — ALBUTEROL SULFATE HFA 108 (90 BASE) MCG/ACT IN AERS: 2.0000 | INHALATION_SPRAY | Freq: Four times a day (QID) | RESPIRATORY_TRACT | 2 refills | Status: DC | PRN

## 2021-01-26 MED ORDER — BOOST BREEZE PO LIQD: 237.0000 mL | Freq: Three times a day (TID) | ORAL | 0 refills | Status: DC

## 2021-01-26 MED ORDER — IOHEXOL 350 MG/ML SOLN
100.0000 mL | Freq: Once | INTRAVENOUS | Status: AC | PRN
  Administered 2021-01-26: 63 mL via INTRAVENOUS

## 2021-01-26 MED ORDER — POLYETHYLENE GLYCOL 3350 17 G PO PACK: 17.0000 g | PACK | Freq: Every day | ORAL | 0 refills | Status: DC

## 2021-01-26 MED ORDER — PANTOPRAZOLE SODIUM 40 MG PO TBEC: 40.0000 mg | DELAYED_RELEASE_TABLET | Freq: Every day | ORAL | 0 refills | Status: DC

## 2021-01-26 MED ORDER — NITROGLYCERIN 0.4 MG SL SUBL: 0.4000 mg | SUBLINGUAL_TABLET | SUBLINGUAL | 0 refills | Status: DC | PRN

## 2021-01-26 MED ORDER — SENNOSIDES-DOCUSATE SODIUM 8.6-50 MG PO TABS: 1.0000 | ORAL_TABLET | Freq: Two times a day (BID) | ORAL | 0 refills | Status: DC

## 2021-01-26 MED ORDER — TRAMADOL HCL 50 MG PO TABS: 50.0000 mg | ORAL_TABLET | Freq: Three times a day (TID) | ORAL | 0 refills | Status: DC | PRN

## 2021-01-26 MED ORDER — GUAIFENESIN ER 600 MG PO TB12: 600.0000 mg | ORAL_TABLET | Freq: Two times a day (BID) | ORAL | 0 refills | Status: DC

## 2021-01-26 MED ORDER — LORAZEPAM 2 MG/ML IJ SOLN
1.0000 mg | Freq: Once | INTRAMUSCULAR | Status: AC
  Administered 2021-01-26: 1 mg via INTRAVENOUS
  Filled 2021-01-26: qty 1

## 2021-01-26 MED ORDER — POLYETHYLENE GLYCOL 3350 17 G PO PACK: 17.0000 g | PACK | Freq: Every day | ORAL | 0 refills | Status: AC

## 2021-01-26 MED ORDER — TRAMADOL HCL 50 MG PO TABS
50.0000 mg | ORAL_TABLET | Freq: Three times a day (TID) | ORAL | 0 refills | Status: DC | PRN
Start: 2021-01-26 — End: 2021-01-26

## 2021-01-26 NOTE — Progress Notes (Signed)
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 Stopped taking all her heart failure medications 3 weeks prior to admission, see related information available heart failure medications and feels like risk outweighs benefit of taking the medication. Besides, she states that God will protect her. Presented with heaviness in her chest this morning.   Patient in the ED, BNP 3795, troponin 63, chest x-ray no acute finding.  Is admitted for acute on chronic heart failure exacerbation management.  Cardiology was consulted. The patient has received IV lasix which has been transitioned to oral torsemide on 01/25/2021. The patient refuses any cardiac medication other than lasix. Diuresis has resulted in a fluid balance that is negative 5.192 L. Cardiology has signed off.   Patient was discharged to go home. However, a couple of hours later I was contacted by nursing and told that the patient had become acutely hypoxic with SaO2 of 80% on room air. CXR was obtained and demonstrated no acute pathology. Discharge was cancelled and CTA chest was ordered to investigate possibility of PE. The patient was also given ativan for anxiety.  Consultants  . Cardiology  Procedures  . None  Antibiotics   Anti-infectives (From admission, onward)   Start     Dose/Rate Route Frequency Ordered Stop   01/22/21 0800  atovaquone (MEPRON) 750 MG/5ML suspension 1,500 mg        1,500 mg Oral Daily with breakfast 01/21/21 1349      .   Subjective  The patient is resting comfortably. No acute distress.  Objective    Vitals:  Vitals:   01/26/21 0418 01/26/21 0910  BP: (!) 153/124 (!) 155/111  Pulse: 97 95  Resp:  17  Temp: 98.7 F (37.1 C) 98.4 F (36.9 C)  SpO2: 100% 99%    Exam:  Constitutional:  . The patient is awake, alert, and oriented x 3. No acute distress. Respiratory:  . No increased work of breathing. . No wheezes, rales, or rhonchi . No tactile fremitus Cardiovascular:  . Regular rate and rhythm . No murmurs, ectopy, or gallups. . No lateral PMI. No thrills. Abdomen:  . Abdomen is soft, non-tender, non-distended . No hernias, masses, or organomegaly . Normoactive bowel sounds.  [place] Musculoskeletal:  . No cyanosis, clubbing, or edema . Cachexia Skin:  . No rashes, lesions, ulcers . palpation of skin: no induration or nodules Neurologic:  . CN 2-12 intact . Sensation all 4 extremities intact Psychiatric:  . Mental status o Mood, affect appropriate o Orientation to person, place, time  . judgment and insight appear intact  I have personally reviewed the following:   Today's Data  . Vitals, CBC, CMP  Imaging  . CXR . CTA chest pending.  Cardiology Data  . Echocardiogram - EF < 20% with grade II diastolic dysfunction.  Scheduled Meds: . aspirin EC  81 mg Oral Daily  . atorvastatin  40 mg Oral Daily  . atovaquone  1,500 mg Oral Q breakfast  . enoxaparin (LOVENOX) injection  40 mg Subcutaneous Q24H  . feeding supplement  237 mL Oral TID BM  . guaiFENesin  600 mg Oral BID  .  isosorbide-hydrALAZINE  2 tablet Oral TID  . nicotine  14 mg Transdermal Daily  . pantoprazole  40 mg Oral Daily  . polyethylene glycol  17 g Oral Daily  . potassium chloride  40 mEq Oral Daily  . senna-docusate  1 tablet Oral BID  . sodium chloride flush  3 mL Intravenous Q12H  . torsemide  20 mg Oral Daily   Continuous Infusions: . sodium chloride      Active Problems:   Chronic systolic CHF (congestive heart failure) (HCC)   CHF (congestive heart failure) (HCC)    Pressure injury of skin   LOS: 5 days   A & P  Acute hypoxic respiratory failure: CXR demonstrates no no acute pathology. CTA chest is pending as patient is at high risk of PE given her severe systolic CHF. Discharge is cancelled.   Anxiety: Prn lorazepam is available.  Acute on Chronic Combined Systolic and Diastolic heart failure decompensation: Pt is 5.9 L negative since admission with diuresis. She has been transitioned to oral torsemide from IV Lasix. Per echocardiogram from 01/22/2021 patient's EF is 20%. She refuses any cardiac meds other than lasix. She states that God will take care of her. She denies that her life is at risk due to her noncompliance.   Asthma: Continue with nebulizer  HIV noncompliant with Medications: Noted. Pt was given atovaquone inpatient, but she refuses to take any HIV meds. She is very fatalistic about her health.  Current smoker: Continue with nicotine patch  Pancytopenia: likely related to HIV  Non-anion gap metabolic acidosis: Resolved.   CKD stage II: Monitor renal function and Lasix. Stable.   Severe protein caloric malnutrition: Continue with protein Ensure  Abdominal Pain, Epigastric Flank Pain:  Resolved. UA negative, KUB showed colonic stool burden.  Likely due to constipation. Miralax started.   Hypokalemia: Supplemented. Monitor and supplement as necessary.   Severe Protein calorie malnutrition: Evidenced by BMI, cachexia,  Estimated body mass index is 19.3 kg/m as calculated from the following:   Height as of this encounter: 5\' 4"  (1.626 m).   Weight as of this encounter: 51 kg.  I have seen and examined this patient myself. I have spent 42 minuets in her evaluation and care.  DVT prophylaxis: Lovenox Code Status: DNR Family Communication:Care discussed with patient Disposition Plan:   Status is: Inpatient  Remains inpatient appropriate because: Work up for sudden onset dyspnea and hypoxia.  Dispo: The patient is  from: Home  Anticipated d/c is to: Home  Anticipated d/c date is: 1 day  Patient currently is not medically stable to d/c. discharge 2/16 if stable.               Difficult to place patient No  Ernst Cumpston, DO [facility] Direct contact: see [email or web address]  7PM-7AM contact night coverage as above 01/26/2021, 4:24 PM  LOS: 5 days

## 2021-01-26 NOTE — Progress Notes (Signed)
Patient refused blood pressure medications.  Blood pressure is 150/129 at this time.  Educated patient on the importance of taking her blood pressure medication , explained to patient the risks that can occur with a blood pressure this high.  Patient verbalizes understanding but continues to refuse medication.  MD notified.  Will continue to monitor.   Rico Sheehan, RN

## 2021-01-26 NOTE — Progress Notes (Signed)
Pt requested RN to come to room. Upon arriving, pt stated she had just went to the bath and became very SOB. Pt diaphoretic and anxious. RR 20, Oxygen Saturation 93% on Room Air. RN administer PRN nebulizer, place 2L [place] and paged [place], MD. Order received for CXR and one time dose of ativan. Will continue to monitor closely.

## 2021-01-27 DIAGNOSIS — J810 Acute pulmonary edema: Secondary | ICD-10-CM | POA: Diagnosis not present

## 2021-01-27 DIAGNOSIS — I5041 Acute combined systolic (congestive) and diastolic (congestive) heart failure: Secondary | ICD-10-CM | POA: Diagnosis not present

## 2021-01-27 DIAGNOSIS — I5022 Chronic systolic (congestive) heart failure: Secondary | ICD-10-CM | POA: Diagnosis not present

## 2021-01-27 DIAGNOSIS — B20 Human immunodeficiency virus [HIV] disease: Secondary | ICD-10-CM | POA: Diagnosis not present

## 2021-01-27 LAB — GLUCOSE, CAPILLARY: Glucose-Capillary: 138 mg/dL — ABNORMAL HIGH (ref 70–99)

## 2021-01-27 NOTE — Progress Notes (Signed)
Patient refused Blood pressure medications.  Patient educated on the importance on the importance of taking her blood pressure medications.  MD is aware that patient consistently refuses the blood pressure medications.  Will continue to monitor.     Donah Driver, RN

## 2021-01-27 NOTE — Discharge Summary (Signed)
Physician Discharge Summary  Jane Estrada QIH:[id number] DOB: [birth year] DOA: 01/21/2021  PCP: Marliss Coots, NP  Admit date: 01/21/2021 Discharge date: 01/27/2021  Recommendations for Outpatient Follow-up:  Follow up with PCP in 7-10 days. Have chemistry drawn on that visit and reported to PCP. Follow up with cardiology as directed. Keep appointments with infectious disease.  Discharge Diagnoses: Principal diagnosis is #1 1. Acute hypoxic respiratory failure 2. Anxiety/Panic attacks 3. 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 Asthma 5. HIV - noncompliant with medications 6. Current smoker 7. 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 Non-anion gap metabolic acidosis 9. CKD stage II 10. Severe protein calorie malnutrition with BMI 18.28, cachexia, and scaffoid abdomen 11. Abdominal pain/epigastric flank pain 12. Constipation 13. Hypocalcemia  Discharge Condition: Fair  Disposition: Home  Diet recommendation: Regular  Filed Weights   01/21/21 1024 01/22/21 0504 01/27/21 0628  Weight: 49 kg 51 kg 48.3 kg    History of present illness:  Jane Estrada is a 45 y.o. female with medical history significant of HIV not on HAART, most recent CD4< 35, on PCP prophylaxis with atovaquone, combined systolic and diastolic CHF LVEF 87%, noncompliant with CHF medications, mild intermittent asthma, CKD stage II, cigarette smoke, presented with increasing shortness of breath for 1 week.  Patient has not been compliant with her CHF medications, and she sopped taking all his CHF medications 3 weeks ago, she claims that she read through the information about CHF medications and feels like risks outweighed benefit.  She claims Lasix makes her constipated and Aldactone has a risk of making nipple discharges.  She she denied any cough wheezing, she started to feel shortness of breath about 7 days ago, she used albuterol inhaler with minimum help and only thing  helpful she claims is the steams coming out from hot shower every night.  She started to feel heaviness on her chest this morning and came to the ED.  She does still take atovaquone, she refused to be restarted on HAART which she feels has been making her sick and " I have been doing fine without HIV medication since 2015". She still smoking 10 cigarettes a day.  ED Course: Fluid overload, given 40 mg Lasix, blood pressure significantly elevated.  Chest x-ray showed lungs congested, with fluid in the fissures.  Hospital Course:  The patient was admitted to a telemetry bed. She was diuresed with lasix. Cardiology was consulted. Echocardiogram was obtained and demonstrated EF of 20% with severely decreased LV function. The LV demonstrates global hypokinesis with severe dilatation of the internal cavity. There is mild LVH. There is grade II diastolic dysfunction. The patient routine refused all medications except lasix. She also refuses all of her HIV medications.   Her respiratory status gradually improved to baseline. She was discharged to home yesterday, but developed a paroxysm of diaphoresis, shortness of breath and tachycardia prior to discharge. Discharge was cancelled. She was given lorazepam. CXR demonstrated no acute pathology. CTA chest was negative for PE or acute pathology. Her respiratory status returned to baseline.   The patient was again discharged to home today in fair condition.  Today's assessment: S: The patient is resting comfortably. No new complaints. She denies that she could have had a panic attack. O: Vitals:  Vitals:   01/27/21 0628 01/27/21 0918  BP: (!) 163/127 (!) 150/114  Pulse: 94 98  Resp: 19 17  Temp: 98.2 F (36.8 C) 98.3 F (36.8 C)  SpO2: 95% 100%  Exam:  Constitutional:  . The patient is awake, alert, and oriented x 3. No acute distress. Respiratory:  . No increased work of breathing. . No wheezes, rales, or rhonchi . No tactile  fremitus Cardiovascular:  . Regular rate and rhythm . No murmurs, ectopy, or gallups. . No lateral PMI. No thrills. Abdomen:  . Abdomen is soft, non-tender, non-distended . No hernias, masses, or organomegaly . Normoactive bowel sounds.  Musculoskeletal:  . No cyanosis, clubbing, or edema Skin:  . No rashes, lesions, ulcers . palpation of skin: no induration or nodules Neurologic:  . CN 2-12 intact . Sensation all 4 extremities intact Psychiatric:  . Mental status o Mood, affect appropriate o Orientation to person, place, time  . judgment and insight appear intact   Discharge Instructions  Discharge Instructions    (HEART FAILURE PATIENTS) Call MD:  Anytime you have any of the following symptoms: 1) 3 pound weight gain in 24 hours or 5 pounds in 1 week 2) shortness of breath, with or without a dry hacking cough 3) swelling in the hands, feet or stomach 4) if you have to sleep on extra pillows at night in order to breathe.   Complete by: As directed    AMB referral to CHF clinic   Complete by: As directed    Call MD for:  difficulty breathing, headache or visual disturbances   Complete by: As directed    Diet - low sodium heart healthy   Complete by: As directed    Discharge instructions   Complete by: As directed    Follow up with PCP in 7-10 days. Have chemistry drawn on that visit and reported to PCP. Follow up with cardiology as directed. Keep appointments with infectious disease.   Heart Failure patients record your daily weight using the same scale at the same time of day   Complete by: As directed    Increase activity slowly   Complete by: As directed    No wound care   Complete by: As directed    STOP any activity that causes chest pain, shortness of breath, dizziness, sweating, or exessive weakness   Complete by: As directed      Allergies as of 01/27/2021      Reactions   Lisinopril Anaphylaxis   Remeron [mirtazapine] Shortness Of Breath   Abilify  [aripiprazole] Rash      Medication List    STOP taking these medications   ibuprofen 200 MG tablet Commonly known as: ADVIL   spironolactone 25 MG tablet Commonly known as: ALDACTONE     TAKE these medications   albuterol 108 (90 Base) MCG/ACT inhaler Commonly known as: VENTOLIN HFA Inhale 2 puffs into the lungs every 6 (six) hours as needed for wheezing or shortness of breath.   aspirin 81 MG EC tablet Take 1 tablet (81 mg total) by mouth daily. Swallow whole.   atorvastatin 40 MG tablet Commonly known as: LIPITOR Take 1 tablet (40 mg total) by mouth daily.   atovaquone 750 MG/5ML suspension Commonly known as: MEPRON Take 10 mLs (1,500 mg total) by mouth daily with breakfast.   carvedilol 6.25 MG tablet Commonly known as: COREG Take 1 tablet (6.25 mg total) by mouth 2 (two) times daily with a meal.   feeding supplement (BOOST BREEZE) Liqd Take 237 mLs by mouth 3 (three) times daily between meals.   guaiFENesin 600 MG 12 hr tablet Commonly known as: MUCINEX Take 1 tablet (600 mg total) by mouth 2 (two) times  daily.   isosorbide-hydrALAZINE 20-37.5 MG tablet Commonly known as: BIDIL Take 2 tablets by mouth 3 (three) times daily.   nicotine 14 mg/24hr patch Commonly known as: NICODERM CQ - dosed in mg/24 hours Place 1 patch (14 mg total) onto the skin daily.   nitroGLYCERIN 0.4 MG SL tablet Commonly known as: NITROSTAT Place 1 tablet (0.4 mg total) under the tongue every 5 (five) minutes as needed for chest pain.   pantoprazole 40 MG tablet Commonly known as: PROTONIX Take 1 tablet (40 mg total) by mouth daily.   polyethylene glycol 17 g packet Commonly known as: MIRALAX / GLYCOLAX Take 17 g by mouth daily.   potassium chloride SA 20 MEQ tablet Commonly known as: KLOR-CON Take 1 tablet (20 mEq total) by mouth daily.   sacubitril-valsartan 24-26 MG Commonly known as: ENTRESTO Take 1 tablet by mouth 2 (two) times daily.   senna-docusate 8.6-50 MG  tablet Commonly known as: Senokot-S Take 1 tablet by mouth 2 (two) times daily.   torsemide 20 MG tablet Commonly known as: DEMADEX Take 1 tablet (20 mg total) by mouth daily.   traMADol 50 MG tablet Commonly known as: ULTRAM Take 1 tablet (50 mg total) by mouth every 8 (eight) hours as needed for moderate pain.      Allergies  Allergen Reactions  . Lisinopril Anaphylaxis  . Remeron [Mirtazapine] Shortness Of Breath  . Abilify [Aripiprazole] Rash    The results of significant diagnostics from this hospitalization (including imaging, microbiology, ancillary and laboratory) are listed below for reference.    Significant Diagnostic Studies: CT ANGIO CHEST PE W OR WO CONTRAST  Result Date: 01/26/2021 CLINICAL DATA:  Dyspnea EXAM: CT ANGIOGRAPHY CHEST WITH CONTRAST TECHNIQUE: Multidetector CT imaging of the chest was performed using the standard protocol during bolus administration of intravenous contrast. Multiplanar CT image reconstructions and MIPs were obtained to evaluate the vascular anatomy. CONTRAST:  36mL OMNIPAQUE IOHEXOL 350 MG/ML SOLN COMPARISON:  01/26/2021, 12/21/2020 FINDINGS: Cardiovascular: This is a technically adequate evaluation of the pulmonary vasculature. No filling defects or pulmonary emboli. The heart is enlarged with prominent left ventricular hypertrophy again noted. No pericardial effusion. Normal caliber of the thoracic aorta. Mediastinum/Nodes: No enlarged mediastinal, hilar, or axillary lymph nodes. Thyroid gland, trachea, and esophagus demonstrate no significant findings. Lungs/Pleura: No acute airspace disease, effusion, or pneumothorax. Central airways are patent. Upper Abdomen: No acute abnormality. Musculoskeletal: No acute or destructive bony lesions. Reconstructed images demonstrate no additional findings. Review of the MIP images confirms the above findings. IMPRESSION: 1. No evidence of pulmonary embolus. 2. No acute intrathoracic process. 3. Stable  cardiomegaly with prominent left ventricular hypertrophy. Electronically Signed   By: Randa Ngo M.D.   On: 01/26/2021 18:43   DG CHEST PORT 1 VIEW  Result Date: 01/26/2021 CLINICAL DATA:  Dyspnea EXAM: PORTABLE CHEST 1 VIEW COMPARISON:  01/21/2021 chest radiograph. FINDINGS: Stable cardiomediastinal silhouette with mild cardiomegaly. No pneumothorax. No pleural effusion. Lungs appear clear, with no acute consolidative airspace disease and no pulmonary edema. IMPRESSION: Mild cardiomegaly without pulmonary edema. No active pulmonary disease. Electronically Signed   By: Ilona Sorrel M.D.   On: 01/26/2021 14:16   DG Chest Portable 1 View  Result Date: 01/21/2021 CLINICAL DATA:  Shortness of breath EXAM: PORTABLE CHEST 1 VIEW COMPARISON:  12/21/2020 FINDINGS: Cardiac shadow is enlarged but stable. Lungs are clear. No bony abnormality is noted. IMPRESSION: No acute abnormality noted. Electronically Signed   By: Inez Catalina M.D.   On: 01/21/2021 11:11  DG Abd 2 Views  Result Date: 01/22/2021 CLINICAL DATA:  Left lower quadrant and left flank pain as well as diarrhea for 4 days. EXAM: ABDOMEN - 2 VIEW COMPARISON:  None. FINDINGS: No bowel dilation to suggest obstruction. No bowel air-fluid levels. No evidence of adynamic ileus. No free air. Abdominal and pelvic soft tissues are unremarkable. There is mild increased colonic stool burden. Stomach appears to the moderately distended. Skeletal structures are unremarkable. IMPRESSION: 1. No acute findings. No evidence of bowel obstruction, generalized adynamic ileus or free air. 2. Mild increase in colonic stool burden. Stomach moderately distended. Electronically Signed   By: Lajean Manes M.D.   On: 01/22/2021 20:03    Microbiology: Recent Results (from the past 240 hour(s))  Resp Panel by RT-PCR (Flu A&B, Covid) Nasopharyngeal Swab     Status: None   Collection Time: 01/21/21 10:51 AM   Specimen: Nasopharyngeal Swab; Nasopharyngeal(NP) swabs in vial  transport medium  Result Value Ref Range Status   SARS Coronavirus 2 by RT PCR NEGATIVE NEGATIVE Final    Comment: (NOTE) SARS-CoV-2 target nucleic acids are NOT DETECTED.  The SARS-CoV-2 RNA is generally detectable in upper respiratory specimens during the acute phase of infection. The lowest concentration of SARS-CoV-2 viral copies this assay can detect is 138 copies/mL. A negative result does not preclude SARS-Cov-2 infection and should not be used as the sole basis for treatment or other patient management decisions. A negative result may occur with  improper specimen collection/handling, submission of specimen other than nasopharyngeal swab, presence of viral mutation(s) within the areas targeted by this assay, and inadequate number of viral copies(<138 copies/mL). A negative result must be combined with clinical observations, patient history, and epidemiological information. The expected result is Negative.  Fact Sheet for Patients:  EntrepreneurPulse.com.au  Fact Sheet for Healthcare Providers:  IncredibleEmployment.be  This test is no t yet approved or cleared by the [place] FDA and  has been authorized for detection and/or diagnosis of SARS-CoV-2 by FDA under an Emergency Use Authorization (EUA). This EUA will remain  in effect (meaning this test can be used) for the duration of the COVID-19 declaration under Section 564(b)(1) of the Act, 21 U.S.C.section 360bbb-3(b)(1), unless the authorization is terminated  or revoked sooner.       Influenza A by PCR NEGATIVE NEGATIVE Final   Influenza B by PCR NEGATIVE NEGATIVE Final    Comment: (NOTE) The Xpert Xpress SARS-CoV-2/FLU/RSV plus assay is intended as an aid in the diagnosis of influenza from Nasopharyngeal swab specimens and should not be used as a sole basis for treatment. Nasal washings and aspirates are unacceptable for Xpert Xpress SARS-CoV-2/FLU/RSV testing.  Fact  Sheet for Patients: EntrepreneurPulse.com.au  Fact Sheet for Healthcare Providers: IncredibleEmployment.be  This test is not yet approved or cleared by the [place] FDA and has been authorized for detection and/or diagnosis of SARS-CoV-2 by FDA under an Emergency Use Authorization (EUA). This EUA will remain in effect (meaning this test can be used) for the duration of the COVID-19 declaration under Section 564(b)(1) of the Act, 21 U.S.C. section 360bbb-3(b)(1), unless the authorization is terminated or revoked.  Performed at [facility], [place] [address] [place], [place] [address]      Labs: Basic Metabolic Panel: Recent Labs  Lab 01/22/21 0344 01/23/21 0637 01/24/21 0149 01/25/21 0141 01/26/21 0155  NA 136 136 135 132* 133*  K 3.9 3.4* 3.8 4.2 4.1  CL 104 103 98 97* 98  CO2 20* 24  26 25 24   GLUCOSE 109* 97 108* 98 131*  BUN 35* 41* 38* 37* 39*  CREATININE 1.31* 1.32* 1.09* 1.11* 1.14*  CALCIUM 9.0 8.7* 9.6 9.6 9.7   Liver Function Tests: Recent Labs  Lab 01/21/21 1047  AST 35  ALT 16  ALKPHOS 38  BILITOT 1.1  PROT 7.1  ALBUMIN 3.6   Recent Labs  Lab 01/21/21 1047  LIPASE 37   No results for input(s): AMMONIA in the last 168 hours. CBC: Recent Labs  Lab 01/21/21 1047 01/22/21 0344  WBC 1.9* 1.9*  NEUTROABS 1.4*  --   HGB 8.6* 8.2*  HCT 27.6* 27.0*  MCV 87.3 87.1  PLT 106* 110*   Cardiac Enzymes: No results for input(s): CKTOTAL, CKMB, CKMBINDEX, TROPONINI in the last 168 hours. BNP: BNP (last 3 results) Recent Labs    12/06/20 0349 12/21/20 0909 01/21/21 1047  BNP 788.9* 1,223.5* 3,795.6*    ProBNP (last 3 results) No results for input(s): PROBNP in the last 8760 hours.  CBG: Recent Labs  Lab 01/27/21 1237  GLUCAP 138*    Active Problems:   Chronic systolic CHF (congestive heart failure) (HCC)   CHF (congestive heart failure) (HCC)   Pressure injury of skin   Time coordinating  discharge: 38 minutes.  Signed:        Jarvis Knodel, DO [facility]  01/27/2021, 4:19 PM

## 2021-01-27 NOTE — Plan of Care (Signed)

## 2021-02-02 ENCOUNTER — Emergency Department (HOSPITAL_COMMUNITY): Payer: Medicaid - Out of State

## 2021-02-02 ENCOUNTER — Observation Stay (HOSPITAL_COMMUNITY)
Admission: EM | Admit: 2021-02-02 | Discharge: 2021-02-03 | Disposition: A | Discharge status: Home / Self Care | Payer: Medicaid - Out of State | Attending: Internal Medicine | Admitting: Internal Medicine

## 2021-02-02 ENCOUNTER — Encounter (HOSPITAL_COMMUNITY): Payer: Self-pay | Admitting: Emergency Medicine

## 2021-02-02 DIAGNOSIS — F17210 Nicotine dependence, cigarettes, uncomplicated: Secondary | ICD-10-CM | POA: Diagnosis not present

## 2021-02-02 DIAGNOSIS — F1910 Other psychoactive substance abuse, uncomplicated: Secondary | ICD-10-CM | POA: Insufficient documentation

## 2021-02-02 DIAGNOSIS — Z7982 Long term (current) use of aspirin: Secondary | ICD-10-CM | POA: Insufficient documentation

## 2021-02-02 DIAGNOSIS — J45909 Unspecified asthma, uncomplicated: Secondary | ICD-10-CM | POA: Diagnosis not present

## 2021-02-02 DIAGNOSIS — I5042 Chronic combined systolic (congestive) and diastolic (congestive) heart failure: Principal | ICD-10-CM | POA: Insufficient documentation

## 2021-02-02 DIAGNOSIS — I130 Hypertensive heart and chronic kidney disease with heart failure and stage 1 through stage 4 chronic kidney disease, or unspecified chronic kidney disease: Secondary | ICD-10-CM | POA: Diagnosis not present

## 2021-02-02 DIAGNOSIS — I169 Hypertensive crisis, unspecified: Secondary | ICD-10-CM

## 2021-02-02 DIAGNOSIS — Z79899 Other long term (current) drug therapy: Secondary | ICD-10-CM | POA: Diagnosis not present

## 2021-02-02 DIAGNOSIS — Z20822 Contact with and (suspected) exposure to covid-19: Secondary | ICD-10-CM | POA: Insufficient documentation

## 2021-02-02 DIAGNOSIS — R0602 Shortness of breath: Secondary | ICD-10-CM | POA: Diagnosis present

## 2021-02-02 DIAGNOSIS — I509 Heart failure, unspecified: Secondary | ICD-10-CM

## 2021-02-02 DIAGNOSIS — I5043 Acute on chronic combined systolic (congestive) and diastolic (congestive) heart failure: Secondary | ICD-10-CM | POA: Diagnosis not present

## 2021-02-02 DIAGNOSIS — N182 Chronic kidney disease, stage 2 (mild): Secondary | ICD-10-CM | POA: Diagnosis not present

## 2021-02-02 DIAGNOSIS — I5041 Acute combined systolic (congestive) and diastolic (congestive) heart failure: Secondary | ICD-10-CM

## 2021-02-02 DIAGNOSIS — I10 Essential (primary) hypertension: Secondary | ICD-10-CM | POA: Diagnosis present

## 2021-02-02 LAB — URINALYSIS, ROUTINE W REFLEX MICROSCOPIC
Bilirubin Urine: NEGATIVE
Glucose, UA: NEGATIVE mg/dL
Ketones, ur: NEGATIVE mg/dL
Leukocytes,Ua: NEGATIVE
Nitrite: NEGATIVE
Protein, ur: NEGATIVE mg/dL
Specific Gravity, Urine: 1.008 (ref 1.005–1.030)
pH: 8 (ref 5.0–8.0)

## 2021-02-02 LAB — CBC
HCT: 33.5 % — ABNORMAL LOW (ref 36.0–46.0)
Hemoglobin: 10 g/dL — ABNORMAL LOW (ref 12.0–15.0)
MCH: 26.9 pg (ref 26.0–34.0)
MCHC: 29.9 g/dL — ABNORMAL LOW (ref 30.0–36.0)
MCV: 90.1 fL (ref 80.0–100.0)
Platelets: 156 10*3/uL (ref 150–400)
RBC: 3.72 MIL/uL — ABNORMAL LOW (ref 3.87–5.11)
RDW: 15.4 % (ref 11.5–15.5)
WBC: 1.6 10*3/uL — ABNORMAL LOW (ref 4.0–10.5)
nRBC: 0 % (ref 0.0–0.2)

## 2021-02-02 LAB — BASIC METABOLIC PANEL
Anion gap: 14 (ref 5–15)
BUN: 26 mg/dL — ABNORMAL HIGH (ref 6–20)
CO2: 20 mmol/L — ABNORMAL LOW (ref 22–32)
Calcium: 9.7 mg/dL (ref 8.9–10.3)
Chloride: 105 mmol/L (ref 98–111)
Creatinine, Ser: 1.15 mg/dL — ABNORMAL HIGH (ref 0.44–1.00)
GFR, Estimated: >60 mL/min (ref >60)
Glucose, Bld: 116 mg/dL — ABNORMAL HIGH (ref 70–99)
Potassium: 4 mmol/L (ref 3.5–5.1)
Sodium: 139 mmol/L (ref 135–145)

## 2021-02-02 LAB — RAPID URINE DRUG SCREEN, HOSP PERFORMED
Amphetamines: NOT DETECTED
Barbiturates: NOT DETECTED
Benzodiazepines: NOT DETECTED
Cocaine: NOT DETECTED
Opiates: NOT DETECTED
Tetrahydrocannabinol: POSITIVE — AB

## 2021-02-02 LAB — I-STAT BETA HCG BLOOD, ED (MC, WL, AP ONLY): I-stat hCG, quantitative: <5 m[IU]/mL (ref <5)

## 2021-02-02 LAB — RESP PANEL BY RT-PCR (FLU A&B, COVID) ARPGX2
Influenza A by PCR: NEGATIVE
Influenza B by PCR: NEGATIVE
SARS Coronavirus 2 by RT PCR: NEGATIVE

## 2021-02-02 LAB — TROPONIN I (HIGH SENSITIVITY)
Troponin I (High Sensitivity): 39 ng/L — ABNORMAL HIGH (ref <18)
Troponin I (High Sensitivity): 49 ng/L — ABNORMAL HIGH (ref <18)

## 2021-02-02 LAB — BRAIN NATRIURETIC PEPTIDE: B Natriuretic Peptide: 2830.5 pg/mL — ABNORMAL HIGH (ref 0.0–100.0)

## 2021-02-02 MED ORDER — ATOVAQUONE 750 MG/5ML PO SUSP
1500.0000 mg | Freq: Every day | ORAL | Status: DC
Start: 2021-02-03 — End: 2021-02-03
  Filled 2021-02-02 (×3): qty 10

## 2021-02-02 MED ORDER — TORSEMIDE 20 MG PO TABS: 20.0000 mg | ORAL_TABLET | Freq: Every day | ORAL | Status: DC

## 2021-02-02 MED ORDER — ENOXAPARIN SODIUM 30 MG/0.3ML ~~LOC~~ SOLN: 30.0000 mg | SUBCUTANEOUS | Status: DC

## 2021-02-02 MED ORDER — GUAIFENESIN ER 600 MG PO TB12: 600.0000 mg | ORAL_TABLET | Freq: Two times a day (BID) | ORAL | Status: DC

## 2021-02-02 MED ORDER — FUROSEMIDE 10 MG/ML IJ SOLN
40.0000 mg | Freq: Two times a day (BID) | INTRAMUSCULAR | Status: DC
  Administered 2021-02-02: 40 mg via INTRAVENOUS
  Filled 2021-02-02: qty 4

## 2021-02-02 MED ORDER — GUAIFENESIN ER 600 MG PO TB12
600.0000 mg | ORAL_TABLET | Freq: Two times a day (BID) | ORAL | Status: DC
  Administered 2021-02-02: 600 mg via ORAL
  Filled 2021-02-02: qty 1

## 2021-02-02 MED ORDER — SODIUM CHLORIDE 0.9 % IV SOLN: 250.0000 mL | INTRAVENOUS | Status: DC | PRN

## 2021-02-02 MED ORDER — NITROGLYCERIN IN D5W 200-5 MCG/ML-% IV SOLN
0.0000 ug/min | INTRAVENOUS | Status: DC
  Administered 2021-02-02: 5 ug/min via INTRAVENOUS
  Filled 2021-02-02: qty 250

## 2021-02-02 MED ORDER — SODIUM CHLORIDE 0.9% FLUSH: 3.0000 mL | INTRAVENOUS | Status: DC | PRN

## 2021-02-02 MED ORDER — ASPIRIN EC 81 MG PO TBEC
81.0000 mg | DELAYED_RELEASE_TABLET | Freq: Every day | ORAL | Status: DC
  Administered 2021-02-02: 81 mg via ORAL
  Filled 2021-02-02: qty 1

## 2021-02-02 MED ORDER — ASPIRIN 81 MG PO CHEW
324.0000 mg | CHEWABLE_TABLET | Freq: Once | ORAL | Status: AC
  Administered 2021-02-02: 324 mg via ORAL
  Filled 2021-02-02: qty 4

## 2021-02-02 MED ORDER — POTASSIUM CHLORIDE CRYS ER 20 MEQ PO TBCR: 20.0000 meq | EXTENDED_RELEASE_TABLET | Freq: Every day | ORAL | Status: DC

## 2021-02-02 MED ORDER — SENNOSIDES-DOCUSATE SODIUM 8.6-50 MG PO TABS
1.0000 | ORAL_TABLET | Freq: Two times a day (BID) | ORAL | Status: DC
  Administered 2021-02-02 (×2): 1 via ORAL
  Filled 2021-02-02 (×2): qty 1

## 2021-02-02 MED ORDER — NICOTINE 14 MG/24HR TD PT24: 14.0000 mg | MEDICATED_PATCH | Freq: Every day | TRANSDERMAL | Status: DC

## 2021-02-02 MED ORDER — NICARDIPINE HCL IN NACL 20-0.86 MG/200ML-% IV SOLN
3.0000 mg/h | INTRAVENOUS | Status: DC
  Administered 2021-02-02: 5 mg/h via INTRAVENOUS
  Filled 2021-02-02: qty 200

## 2021-02-02 MED ORDER — ALBUTEROL SULFATE HFA 108 (90 BASE) MCG/ACT IN AERS: 2.0000 | INHALATION_SPRAY | Freq: Four times a day (QID) | RESPIRATORY_TRACT | Status: DC | PRN

## 2021-02-02 MED ORDER — SACUBITRIL-VALSARTAN 24-26 MG PO TABS
1.0000 | ORAL_TABLET | Freq: Two times a day (BID) | ORAL | Status: DC
  Filled 2021-02-02 (×3): qty 1

## 2021-02-02 MED ORDER — NICOTINE 14 MG/24HR TD PT24
14.0000 mg | MEDICATED_PATCH | Freq: Every day | TRANSDERMAL | Status: DC
  Administered 2021-02-02: 14 mg via TRANSDERMAL
  Filled 2021-02-02: qty 1

## 2021-02-02 MED ORDER — PANTOPRAZOLE SODIUM 40 MG PO TBEC
40.0000 mg | DELAYED_RELEASE_TABLET | Freq: Every day | ORAL | Status: DC
Start: 2021-02-02 — End: 2021-02-03
  Administered 2021-02-02: 40 mg via ORAL
  Filled 2021-02-02: qty 1

## 2021-02-02 MED ORDER — LIDOCAINE 5 % EX PTCH
1.0000 | MEDICATED_PATCH | CUTANEOUS | Status: DC
  Administered 2021-02-02: 1 via TRANSDERMAL
  Filled 2021-02-02: qty 1

## 2021-02-02 MED ORDER — ACETAMINOPHEN 325 MG PO TABS: 650.0000 mg | ORAL_TABLET | ORAL | Status: DC | PRN

## 2021-02-02 MED ORDER — BOOST BREEZE PO LIQD: 237.0000 mL | Freq: Three times a day (TID) | ORAL | Status: DC

## 2021-02-02 MED ORDER — ATORVASTATIN CALCIUM 40 MG PO TABS: 40.0000 mg | ORAL_TABLET | Freq: Every day | ORAL | Status: DC

## 2021-02-02 MED ORDER — LIDOCAINE 5 % EX PTCH: 1.0000 | MEDICATED_PATCH | CUTANEOUS | Status: DC

## 2021-02-02 MED ORDER — PANTOPRAZOLE SODIUM 40 MG PO TBEC: 40.0000 mg | DELAYED_RELEASE_TABLET | Freq: Every day | ORAL | Status: DC

## 2021-02-02 MED ORDER — POLYETHYLENE GLYCOL 3350 17 G PO PACK
17.0000 g | PACK | Freq: Every day | ORAL | Status: DC
  Administered 2021-02-02: 17 g via ORAL
  Filled 2021-02-02: qty 1

## 2021-02-02 MED ORDER — CARVEDILOL 12.5 MG PO TABS: 6.2500 mg | ORAL_TABLET | Freq: Two times a day (BID) | ORAL | Status: DC

## 2021-02-02 MED ORDER — TRAMADOL HCL 50 MG PO TABS
50.0000 mg | ORAL_TABLET | Freq: Three times a day (TID) | ORAL | Status: DC | PRN
  Administered 2021-02-02: 50 mg via ORAL
  Filled 2021-02-02: qty 1

## 2021-02-02 MED ORDER — SACUBITRIL-VALSARTAN 24-26 MG PO TABS
1.0000 | ORAL_TABLET | Freq: Two times a day (BID) | ORAL | Status: DC
  Filled 2021-02-02 (×2): qty 1

## 2021-02-02 MED ORDER — ISOSORB DINITRATE-HYDRALAZINE 20-37.5 MG PO TABS
2.0000 | ORAL_TABLET | Freq: Three times a day (TID) | ORAL | Status: DC
  Filled 2021-02-02 (×3): qty 2

## 2021-02-02 MED ORDER — ENOXAPARIN SODIUM 30 MG/0.3ML ~~LOC~~ SOLN
30.0000 mg | SUBCUTANEOUS | Status: DC
  Administered 2021-02-02: 30 mg via SUBCUTANEOUS
  Filled 2021-02-02: qty 0.3

## 2021-02-02 MED ORDER — HYDRALAZINE HCL 20 MG/ML IJ SOLN: 5.0000 mg | Freq: Four times a day (QID) | INTRAMUSCULAR | Status: DC | PRN

## 2021-02-02 MED ORDER — ONDANSETRON HCL 4 MG/2ML IJ SOLN
4.0000 mg | Freq: Four times a day (QID) | INTRAMUSCULAR | Status: DC | PRN
  Administered 2021-02-03: 4 mg via INTRAVENOUS
  Filled 2021-02-02: qty 2

## 2021-02-02 MED ORDER — POLYETHYLENE GLYCOL 3350 17 G PO PACK: 17.0000 g | PACK | Freq: Every day | ORAL | Status: DC

## 2021-02-02 MED ORDER — SODIUM CHLORIDE 0.9% FLUSH
3.0000 mL | Freq: Two times a day (BID) | INTRAVENOUS | Status: DC
  Administered 2021-02-02 – 2021-02-03 (×3): 3 mL via INTRAVENOUS

## 2021-02-02 NOTE — Consult Note (Addendum)
Cardiology Consultation:   Patient ID: Jane Estrada MRN: [id number]; DOB: August 04, 1976  Admit date: 02/02/2021 Date of Consult: 02/02/2021  PCP:  Marliss Coots, NP   [place]  Cardiologist:  Jenkins Rouge, MD - in hospital (no outpatient follow up) Advanced Heart Failure: Glori Bickers, MD 12/29/2020 in-hospital  Patient Profile:   Jane Estrada is a 45 y.o. 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 Roosevelt Locks.   Jane Estrada previously lived in [place], [place] in 2014 was normal, EF 40% by echo in 2019.  Patient with documented history of cocaine use, not on therapy for her HIV, refused to take cardiac medications and left AMA.  Pt hospitalized 03/2020 for CHF and seen by cards.  Several ER visit for dyspnea and refusing medications.   Admitted 01/11-01/19/2022 for CHF exacerbation, seen by Dr Haroldine Laws, pt refusing to take meds as outpt.  Last echocardiogram December 22, 2020 with LV function of 63%, grade 2 diastolic dysfunction.  Most recent admission 2/11-2/17/22 for acute CHF exacerbation in setting of hypertensive crisis.  Refused cardiac medication against.  Only taking diuretics.  Did not felt candidate for ICD given noncompliance.  History of Present Illness:   Jane Estrada living in hotel since discharge.  She was not able [facility] [address] to [facility] her prescriptions.  She has  worsening shortness of breath since discharge.  Also noted left flank pain.  Noted hypertensive by EMS at 180/110.  Upon arrival to ER code STEMI was initially called due to EKG concerning for inferior ST elevation.  Patient  was evaluated by Dr. Einar Gip and code STEMI canceled.  Drug screen positive for marijuana BNP almost 3000 Creatinine 1.15 High-sensitivity troponin 39 Hemoglobin 10 Chest x-ray without acute abnormality  Started on nitroglycerin and nicardipine drip secondary to elevated blood pressure.  Reports smoking tobacco and marijuana.   Past Medical History:  Diagnosis Date  . Bipolar affective ([place])   . Chronic systolic CHF (congestive heart failure) ([place])   . Granular cell tumor   . HIV (human immunodeficiency virus infection) ([place])   . Hypertension   . Myocardial infarct ([place]) 2006  . Tobacco dependence     Past Surgical History:  Procedure Laterality Date  . none       Inpatient Medications: Scheduled Meds: . aspirin  81 mg Oral Daily  . atorvastatin  40 mg Oral Daily  . [START ON 02/03/2021] atovaquone  1,500 mg Oral Q breakfast  . carvedilol  6.25 mg Oral BID WC  . enoxaparin (LOVENOX) injection  30 mg Subcutaneous Q24H  . feeding supplement (BOOST BREEZE)  237 mL Oral TID BM  . guaiFENesin  600 mg Oral BID  . isosorbide-hydrALAZINE  2 tablet Oral TID  . lidocaine  1 patch Transdermal Q24H  . nicotine  14 mg Transdermal Daily  . pantoprazole  40 mg Oral Daily  . polyethylene glycol  17 g Oral Daily  . potassium chloride SA  20 mEq Oral Daily  . sacubitril-valsartan  1 tablet Oral BID  . senna-docusate  1 tablet Oral BID  . sodium chloride flush  3 mL Intravenous Q12H  . torsemide  20 mg Oral Daily   Continuous Infusions: . sodium chloride    . niCARDipine 5 mg/hr (02/02/21 1325)   PRN Meds: sodium chloride, acetaminophen, albuterol, ondansetron (ZOFRAN) IV, sodium chloride flush, traMADol  Allergies:    Allergies  Allergen Reactions  . Lisinopril Anaphylaxis  . Remeron [Mirtazapine] Shortness Of Breath  . Abilify [Aripiprazole] Rash    Social History:   Social History   Socioeconomic History  . Marital status: Widowed    Spouse name: Not on file  .  Number of children: 2  . Years of education: Not on file  . Highest education level: Not on file  Occupational History  . Occupation: unemployed  Tobacco Use  . Smoking status: Current Every Day Smoker    Packs/day: 0.50    Years: 30.00    Pack years: 15.00    Types: Cigarettes  . Smokeless tobacco: Never Used  Vaping Use  . Vaping Use: Never used  Substance and Sexual Activity  . Alcohol use: Not Currently    Comment: last drink in 02/2020  . Drug use: Yes    Frequency: 1.0 times per week    Types: Marijuana, "Crack" cocaine    Comment: daily marijuana use; denies using other drugs but previously used crack  . Sexual activity: Not Currently  Other Topics Concern  . Not on file  Social History Narrative  . Not on file   Social Determinants of Health   Financial Resource Strain: High Risk  . Difficulty of Paying Living Expenses: Very hard  Food Insecurity: No Food Insecurity  . Worried About Charity fundraiser in the Last Year: Never true  . Ran Out of Food in the Last Year: Never true  Transportation Needs: Unmet Transportation Needs  . Lack of Transportation (Medical): Yes  . Lack of Transportation (Non-Medical): Yes  Physical Activity: Not on file  Stress: Not on file  Social Connections: Moderately Isolated  . Frequency of Communication with Friends and Family: Three times a week  . Frequency of Social Gatherings with Friends and Family: Three times a week  . Attends Religious Services: Never  . Active Member of Clubs or Organizations: Yes  . Attends Archivist Meetings: Never  . Marital Status: Widowed  Intimate Partner Violence: Not At Risk  . Fear of Current or Ex-Partner: No  . Emotionally Abused: No  . Physically Abused: No  . Sexually Abused: No    Family History:    Family History  Problem Relation Age of Onset  . Hypertension Mother   . Cancer Father      ROS:  Please see the history of present illness.  All other ROS reviewed and  negative.     Physical Exam/Data:   Vitals:   02/02/21 1445 02/02/21 1500 02/02/21 1515 02/02/21 1530  BP: (!) 154/120 (!) 148/113 (!) 159/127 (!) 151/123  Pulse: (!) 103 97 (!) 101 (!) 109  Resp: (!) 28 (!) 28 11 (!) 21  Temp:      TempSrc:      SpO2: 100% 94% 91% 94%   No intake or output data in the 24 hours ending 02/02/21 1555 Last 3 Weights 01/27/2021 01/22/2021 01/21/2021  Weight (lbs) 106 lb 8 oz 112 lb 7 oz 108 lb  Weight (kg) 48.308 kg 51 kg 48.988 kg     There is no height or weight on file to calculate BMI.  General:  Well nourished, well developed,  in no acute distress HEENT: normal Lymph: no adenopathy Neck: no JVD Endocrine:  No thryomegaly Vascular: No carotid bruits; FA pulses 2+ bilaterally without bruits  Cardiac:  normal S1, S2; RRR; no murmur  Lungs:  clear to auscultation bilaterally, no wheezing, rhonchi or rales  Abd: soft, nontender, no hepatomegaly  Ext: no edema Musculoskeletal:  No deformities, BUE and BLE strength normal and equal Skin: warm and dry  Neuro:  CNs 2-12 intact, no focal abnormalities noted Psych:  Normal affect   EKG:  The EKG was personally reviewed and demonstrates: Sinus tachycardia, LVH, nonspecific T wave inversion in lateral leads Telemetry:  Telemetry was personally reviewed and demonstrates: Sinus rhythm  Relevant CV Studies:  Echo 12/2020 1. Left ventricular ejection fraction, by estimation, is 20%. The left  ventricle has severely decreased function. The left ventricle demonstrates  global hypokinesis. The left ventricular internal cavity size was severely  dilated. There is mild left  ventricular hypertrophy. Left ventricular diastolic parameters are  consistent with Grade II diastolic dysfunction (pseudonormalization).  Elevated left ventricular end-diastolic pressure.  2. Right ventricular systolic function is normal. The right ventricular  size is normal.  3. Left atrial size was mildly dilated.  4. The mitral  valve is normal in structure. Mild mitral valve  regurgitation. No evidence of mitral stenosis.  5. The aortic valve is normal in structure. Aortic valve regurgitation is  not visualized. No aortic stenosis is present.  6. The inferior vena cava is normal in size with greater than 50%  respiratory variability, suggesting right atrial pressure of 3 mmHg.   Laboratory Data:  High Sensitivity Troponin:   Recent Labs  Lab 01/21/21 1047 01/21/21 1251 02/02/21 1144  TROPONINIHS 63* 64* 39*     Chemistry Recent Labs  Lab 02/02/21 1144  NA 139  K 4.0  CL 105  CO2 20*  GLUCOSE 116*  BUN 26*  CREATININE 1.15*  CALCIUM 9.7  GFRNONAA >60  ANIONGAP 14    Hematology Recent Labs  Lab 02/02/21 1144  WBC 1.6*  RBC 3.72*  HGB 10.0*  HCT 33.5*  MCV 90.1  MCH 26.9  MCHC 29.9*  RDW 15.4  PLT 156   BNP Recent Labs  Lab 02/02/21 1231  BNP 2,830.5*    Radiology/Studies:  DG Chest 2 View  Result Date: 02/02/2021 CLINICAL DATA:  Short of breath EXAM: CHEST - 2 VIEW COMPARISON:  01/26/2021 FINDINGS: Cardiac enlargement without heart failure. No focal infiltrate or effusion. No mass lesion. IMPRESSION: Cardiac enlargement.  No acute abnormality. Electronically Signed   By: Franchot Gallo M.D.   On: 02/02/2021 12:43     Assessment and Plan:   1.  Acute on chronic combined CHF -In setting of medication noncompliance and hypertensive urgency -Multiple ER evaluation and to admission last month for CHF exacerbation.  She declined cardiac medication but was taking diuretics during admission. -BNP elevated at 2830.  Chest x-ray without acute abnormality -Appears euvolemic by exam -She is started on torsemide 20 mg daily, Entresto 24/26 mg twice daily, BiDil 20/37.5 mg 3 times daily, Coreg 6.25 mg twice daily -She has declined all of these medications again in ER this afternoon -Can consider simplifying with generic medication.  Recommended giving medication from [place] if  willingness.  Poor prognosis.  Can consider palliative/hospice care as recommended last admission.  -Seems poor insight of her health condition.  Try to discuss importance of medication but declined. -No further recommendations.   2.  Hypertensive urgency -Blood  pressure improving on nitroglycerin drip  3.  Tobacco and marijuana smoking -Recommended cessation  Risk Assessment/Risk Scores:    New York Heart Association (NYHA) Functional Class NYHA Class IV  For questions or updates, please contact [facility] Please consult [email or web address] for contact info under    Jarrett Soho, [place]  02/02/2021 3:55 PM   Patient seen and examined.  Agree with above documentation.  Jane Estrada is a 45 year old female with a history of nonischemic cardiomyopathy (EF 20%), noncompliance, bipolar disorder with multiple suicide attempts, polysubstance abuse, HIV not on HAART we are consulted by Dr. Roosevelt Locks for evaluation of heart failure.  She has had multiple recent admissions.  Most recently admitted 2/11 through 01/27/2021 with decompensated heart failure in setting of hypertensive crisis.  She refused all cardiac medications except diuretics.  Reports she did not pick up any medications on discharge.  She reports she has had worsening shortness of breath, prompting her to present to ED today.  On presentation to the ED, initial vital signs showed BP 174/133, pulse 103, SPO2 100% on room air.  Labs showed creatinine 1.15, troponin 39, hemoglobin 10, platelets 156, BNP 2830.  Chest x-ray showed cardiac enlargement but no acute abnormality.  EKG shows LVH with repolarization abnormalities, sinus tachycardia, rate 102.  There was concern for ST elevation on initial EKG and STEMI alert was called in ED, but evaluated by interventional cardiologist and STEMI was canceled.  Patient seen and examined.  Agree with above documentation.  On exam, patient is alert and oriented, regular rate and rhythm, no murmurs,  lungs CTAB, no LE edema, + JVD.  She was started on nitroglycerin drip in the ED though was then switched to nicardipine drip.  Heart failure meds were started, but she has refused any medications.  For her hypertensive urgency, would favor nitroglycerin drip over nicardipine given potential negative inotropic effects of nicardipine.  Ideally would just start her heart failure meds and wean off nitro drip, but she is currently refusing medications.  Can start diuresis with IV Lasix 40 mg twice daily if she is agreeable.  Her care has been complicated by refusing to take any medications.  Donato Heinz, MD

## 2021-02-02 NOTE — H&P (Signed)
History and Physical    Jane Estrada BTD:[id number] DOB: Sep 26, 1976 DOA: 02/02/2021  PCP: Marliss Coots, NP (Confirm with patient/family/NH records and if not entered, this has to be entered at [facility] point of entry) Patient coming from: Homeless  I have personally briefly reviewed patient's old medical records in [place]  Chief Complaint: SOB  HPI: Jane Estrada is a 45 y.o. female with medical history significant of HIV not on HAART, most recent CD4 less than 35, on PCP prophylaxis with atovaquone, history of combined systolic and diastolic CHF LVEF 10% noncompliant with CHF medications, mild intermittent asthma, CKD stage II, cigarette smoke, presented with increasing shortness of breath for 5 days.  Patient was recently hospitalized for CHF decompensation/hypertension emergency due to noncompliance with CHF medications, was released from hospital 5 days ago.  Patient claimed that she is homeless and has been living in a motel since discharge, but she claims that she has not been able to [facility] [address] due to no transportation and thus not starting to taking any of the CHF/HTN meds. She started to feel SOB and soon getting worse over last 2-3 days, no cough, no fever and no chest pain. She does have a chronic left flank pain which has been getting worse, she felt it is a muscular pain. No urinary symptoms such a frequency or dysuria.  ED Course: BP 170/130, checks x-ray clear, no hypoxia.  EKG showed ST flattening and T wave inversion on 2, 3, aVF, V4 through V6.  Code STEMI was called, Cardiology reviewed EKG and cancelled the CODE STEMI.  Review of Systems: As per HPI otherwise 14 point review of systems negative.    Past Medical History:  Diagnosis Date  . Bipolar affective ([place])   . Chronic systolic CHF (congestive heart failure) ([place])   . Granular cell tumor   . HIV (human immunodeficiency virus infection) ([place])   . Hypertension   . Myocardial  infarct ([place]) 2006  . Tobacco dependence     Past Surgical History:  Procedure Laterality Date  . none       reports that she has been smoking cigarettes. She has a 15.00 pack-year smoking history. She has never used smokeless tobacco. She reports previous alcohol use. She reports current drug use. Frequency: 1.00 time per week. Drugs: Marijuana and "Crack" cocaine.  Allergies  Allergen Reactions  . Lisinopril Anaphylaxis  . Remeron [Mirtazapine] Shortness Of Breath  . Abilify [Aripiprazole] Rash    Family History  Problem Relation Age of Onset  . Hypertension Mother   . Cancer Father      Prior to Admission medications   Medication Sig Start Date End Date Taking? Authorizing Provider  albuterol (VENTOLIN HFA) 108 (90 Base) MCG/ACT inhaler Inhale 2 puffs into the lungs every 6 (six) hours as needed for wheezing or shortness of breath. 01/26/21   Swayze, Ava, DO  aspirin 81 MG EC tablet Take 1 tablet (81 mg total) by mouth daily. Swallow whole. 12/29/20 12/24/21  Kayleen Memos, DO  atorvastatin (LIPITOR) 40 MG tablet Take 1 tablet (40 mg total) by mouth daily. 12/29/20 03/29/21  Kayleen Memos, DO  atovaquone [facility]) 750 MG/5ML suspension Take 10 mLs (1,500 mg total) by mouth daily with breakfast. 12/29/20 03/29/21  Kayleen Memos, DO  carvedilol (COREG) 6.25 MG tablet Take 1 tablet (6.25 mg total) by mouth 2 (two) times daily with a meal. 12/29/20 03/29/21  Kayleen Memos, DO  guaiFENesin [facility])  600 MG 12 hr tablet Take 1 tablet (600 mg total) by mouth 2 (two) times daily. 01/26/21   Swayze, Ava, DO  isosorbide-hydrALAZINE (BIDIL) 20-37.5 MG tablet Take 2 tablets by mouth 3 (three) times daily. 01/26/21   Swayze, Ava, DO  nicotine (NICODERM CQ - DOSED IN MG/24 HOURS) 14 mg/24hr patch Place 1 patch (14 mg total) onto the skin daily. Patient not taking: Reported on 01/21/2021 12/29/20   Kayleen Memos, DO  nitroGLYCERIN (NITROSTAT) 0.4 MG SL tablet Place 1 tablet (0.4 mg total) under the  tongue every 5 (five) minutes as needed for chest pain. 01/26/21   Swayze, Ava, DO  Nutritional Supplements (FEEDING SUPPLEMENT, BOOST BREEZE,) LIQD Take 237 mLs by mouth 3 (three) times daily between meals. 01/26/21   Swayze, Ava, DO  pantoprazole (PROTONIX) 40 MG tablet Take 1 tablet (40 mg total) by mouth daily. 01/27/21   Swayze, Ava, DO  polyethylene glycol (MIRALAX / GLYCOLAX) 17 g packet Take 17 g by mouth daily. 01/27/21   Swayze, Ava, DO  potassium chloride SA (KLOR-CON) 20 MEQ tablet Take 1 tablet (20 mEq total) by mouth daily. 01/27/21   Swayze, Ava, DO  sacubitril-valsartan (ENTRESTO) 24-26 MG Take 1 tablet by mouth 2 (two) times daily. 12/29/20 03/29/21  Kayleen Memos, DO  senna-docusate (SENOKOT-S) 8.6-50 MG tablet Take 1 tablet by mouth 2 (two) times daily. 01/26/21   Swayze, Ava, DO  torsemide (DEMADEX) 20 MG tablet Take 1 tablet (20 mg total) by mouth daily. 01/27/21   Swayze, Ava, DO  traMADol (ULTRAM) 50 MG tablet Take 1 tablet (50 mg total) by mouth every 8 (eight) hours as needed for moderate pain. 01/26/21   Swayze, Ava, DO    Physical Exam: Vitals:   02/02/21 1346 02/02/21 1400 02/02/21 1415 02/02/21 1430  BP: (!) 168/108 (!) 157/118 (!) 148/126 (!) 151/108  Pulse:   (!) 101 (!) 102  Resp:  (!) 21 (!) 27 (!) 31  Temp:      TempSrc:      SpO2:   99% 100%    Constitutional: NAD, calm, comfortable Vitals:   02/02/21 1346 02/02/21 1400 02/02/21 1415 02/02/21 1430  BP: (!) 168/108 (!) 157/118 (!) 148/126 (!) 151/108  Pulse:   (!) 101 (!) 102  Resp:  (!) 21 (!) 27 (!) 31  Temp:      TempSrc:      SpO2:   99% 100%   Eyes: PERRL, lids and conjunctivae normal ENMT: Mucous membranes are moist. Posterior pharynx clear of any exudate or lesions.Normal dentition.  Neck: normal, supple, no masses, no thyromegaly Respiratory: clear to auscultation bilaterally, no wheezing, no crackles. Normal respiratory effort. No accessory muscle use.  Cardiovascular: Regular rate and rhythm, no  murmurs / rubs / gallops. No extremity edema. 2+ pedal pulses. No carotid bruits.  Abdomen: no tenderness, no masses palpated. No hepatosplenomegaly. Bowel sounds positive.  Musculoskeletal: no clubbing / cyanosis. No joint deformity upper and lower extremities. Good ROM, no contractures. Normal muscle tone.  Skin: no rashes, lesions, ulcers. No induration Neurologic: CN 2-12 grossly intact. Sensation intact, DTR normal. Strength 5/5 in all 4.  Psychiatric: Normal judgment and insight. Alert and oriented x 3. Normal mood.     Labs on Admission: I have personally reviewed following labs and imaging studies  CBC: Recent Labs  Lab 02/02/21 1144  WBC 1.6*  HGB 10.0*  HCT 33.5*  MCV 90.1  PLT 778   Basic Metabolic Panel: Recent Labs  Lab  02/02/21 1144  NA 139  K 4.0  CL 105  CO2 20*  GLUCOSE 116*  BUN 26*  CREATININE 1.15*  CALCIUM 9.7   GFR: Estimated Creatinine Clearance: 48.1 mL/min (A) (by C-G formula based on SCr of 1.15 mg/dL (H)). Liver Function Tests: No results for input(s): AST, ALT, ALKPHOS, BILITOT, PROT, ALBUMIN in the last 168 hours. No results for input(s): LIPASE, AMYLASE in the last 168 hours. No results for input(s): AMMONIA in the last 168 hours. Coagulation Profile: No results for input(s): INR, PROTIME in the last 168 hours. Cardiac Enzymes: No results for input(s): CKTOTAL, CKMB, CKMBINDEX, TROPONINI in the last 168 hours. BNP (last 3 results) No results for input(s): PROBNP in the last 8760 hours. HbA1C: No results for input(s): HGBA1C in the last 72 hours. CBG: Recent Labs  Lab 01/27/21 1237  GLUCAP 138*   Lipid Profile: No results for input(s): CHOL, HDL, LDLCALC, TRIG, CHOLHDL, LDLDIRECT in the last 72 hours. Thyroid Function Tests: No results for input(s): TSH, T4TOTAL, FREET4, T3FREE, THYROIDAB in the last 72 hours. Anemia Panel: No results for input(s): VITAMINB12, FOLATE, FERRITIN, TIBC, IRON, RETICCTPCT in the last 72 hours. Urine  analysis:    Component Value Date/Time   COLORURINE STRAW (A) 01/22/2021 1758   APPEARANCEUR CLEAR 01/22/2021 1758   LABSPEC 1.010 01/22/2021 1758   PHURINE 6.0 01/22/2021 1758   GLUCOSEU NEGATIVE 01/22/2021 1758   HGBUR NEGATIVE 01/22/2021 1758   BILIRUBINUR NEGATIVE 01/22/2021 1758   KETONESUR NEGATIVE 01/22/2021 1758   PROTEINUR NEGATIVE 01/22/2021 1758   NITRITE NEGATIVE 01/22/2021 1758   LEUKOCYTESUR NEGATIVE 01/22/2021 1758    Radiological Exams on Admission: DG Chest 2 View  Result Date: 02/02/2021 CLINICAL DATA:  Short of breath EXAM: CHEST - 2 VIEW COMPARISON:  01/26/2021 FINDINGS: Cardiac enlargement without heart failure. No focal infiltrate or effusion. No mass lesion. IMPRESSION: Cardiac enlargement.  No acute abnormality. Electronically Signed   By: Franchot Gallo M.D.   On: 02/02/2021 12:43    EKG: Independently reviewed. ST flattening and T wave inversion on 2, 3, aVF, V4 through V6.  Assessment/Plan Active Problems:   CHF (congestive heart failure) (HCC)   HTN (hypertension), malignant  (please populate well all problems here in Problem List. (For example, if patient is on BP meds at home and you resume or decide to hold them, it is a problem that needs to be her. Same for CAD, COPD, HLD and so on)  Acute on chronic combined systolic and diastolic CHF decompensation secondary to noncompliance with CHF medications and HTN emergency -Continue Nitro drip, aiming at reduction of MAP for no more than 25% for tonight and restart Entresto, Coreg and Imdur/Hydralazine. -Appears to be euvolumic, will restart PO diuresis -Not a Candidate for AICD given her DNR Code status. -Long-term prognosis poor given continuing to be noncompliant with CHF medications.  Educate patient at bedside regarding importance of compliance, patient however continue to find excuses such as transportation or money issue.  HTN emergency -As above.  Mild intermittent asthma -No S/S of acute  exacerbation. -PRN Duoneb.  HIV -Again expressed preference of no HAART medications. -Agreed with continuing atovaquone.  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 Consults called: Cardiology Admission status: PCU   Lequita Halt MD [facility] Pager [phone]  02/02/2021, 2:42 PM

## 2021-02-02 NOTE — ED Provider Notes (Signed)
I provided a substantive portion of the care of this patient.  I personally performed the entirety of the medical decision making for this encounter.  EKG Interpretation  Date/Time:  Wednesday February 02 2021 11:53:04 EST Ventricular Rate:  102 PR Interval:  172 QRS Duration: 84 QT Interval:  402 QTC Calculation: 523 R Axis:   94 Text Interpretation: Sinus tachycardia Rightward axis Biventricular hypertrophy Anterior infarct , age undetermined ST & T wave abnormality, consider inferolateral ischemia Prolonged QT Abnormal ECG inferior ST/Ant elevation new from previous. ACS Confirmed by Charlesetta Shanks [phone]) on 02/02/2021 12:50:28 PM  45 year old female who presents with several months of shortness of breath.  Denies any chest pain or chest pressure.  Patient's EKG shows questionable STEMI changes.  Code STEMI activated.  Will consult cardiology.  Patient also noted to be noncompliant with her medications   Lacretia Leigh, MD 02/02/21 1232

## 2021-02-02 NOTE — ED Triage Notes (Signed)
Patient with history of CHF BIB [facility] for evaluation of shortness of breath that started approximately three months ago, spO2 95% on room air. BP 180/100 with EMS, patient states she was prescribed antihypertensive medications but they make her feel weak and dizzy so she does not take them. Patient also reports bilateral flank pain.

## 2021-02-02 NOTE — ED Notes (Signed)
Provided pt with new purwick

## 2021-02-02 NOTE — ED Provider Notes (Cosign Needed)
[facility] EMERGENCY DEPARTMENT Provider Note   CSN: [id number] Arrival date & time: 02/02/21  1136     History Chief Complaint  Patient presents with  . Shortness of Breath    Jane Estrada is a 45 y.o. female.  HPI   45 year old female with a history of bipolar affective disorder, CHF, granular cell tumor, HIV, hypertension, MI, substance use, who presents to the emergency department today for evaluation of shortness of breath.  Patient reports she has had shortness of breath for the last several months.  She reports she has had intermittent chest pain.  Is located to the left side of her chest.  Yesterday she also reports having some arm pain.  She denies any associated diaphoresis or nausea.  She was recently discharged from the hospital and states she has not taken any of her blood pressure medications.  Past Medical History:  Diagnosis Date  . Bipolar affective ([place])   . Chronic systolic CHF (congestive heart failure) ([place])   . Granular cell tumor   . HIV (human immunodeficiency virus infection) ([place])   . Hypertension   . Myocardial infarct ([place]) 2006  . Tobacco dependence     Patient Active Problem List   Diagnosis Date Noted  . Pressure injury of skin 01/25/2021  . CHF (congestive heart failure) ([place]) 01/21/2021  . Shortness of breath 12/21/2020  . Thrush 12/21/2020  . Need for pneumocystis prophylaxis 12/21/2020  . Heart failure ([place])   . Chest pain 03/15/2020  . Polysubstance abuse ([place]) 03/15/2020  . Pancytopenia ([place]) 03/15/2020  . 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 Tobacco dependence   . Hypertension   . HIV (human immunodeficiency virus infection) ([place])   . Granular cell tumor   . Chronic systolic CHF (congestive heart failure) ([place])   . Bipolar affective ([place])   . History of myocardial infarction 2006    Past Surgical History:  Procedure Laterality Date  . none       OB History   No obstetric history on  file.     Family History  Problem Relation Age of Onset  . Hypertension Mother   . Cancer Father     Social History   Tobacco Use  . Smoking status: Current Every Day Smoker    Packs/day: 0.50    Years: 30.00    Pack years: 15.00    Types: Cigarettes  . Smokeless tobacco: Never Used  Vaping Use  . Vaping Use: Never used  Substance Use Topics  . Alcohol use: Not Currently    Comment: last drink in 02/2020  . Drug use: Yes    Frequency: 1.0 times per week    Types: Marijuana, "Crack" cocaine    Comment: daily marijuana use; denies using other drugs but previously used crack    Home Medications Prior to Admission medications   Medication Sig Start Date End Date Taking? Authorizing Provider  albuterol (VENTOLIN HFA) 108 (90 Base) MCG/ACT inhaler Inhale 2 puffs into the lungs every 6 (six) hours as needed for wheezing or shortness of breath. 01/26/21   Swayze, Ava, DO  aspirin 81 MG EC tablet Take 1 tablet (81 mg total) by mouth daily. Swallow whole. 12/29/20 12/24/21  Kayleen Memos, DO  atorvastatin (LIPITOR) 40 MG tablet Take 1 tablet (40 mg total) by mouth daily. 12/29/20 03/29/21  Kayleen Memos, DO  atovaquone [facility]) 750 MG/5ML suspension Take 10 mLs (1,500 mg total) by mouth daily with breakfast. 12/29/20 03/29/21  [place] Crane,  Lorenda Cahill, DO  carvedilol (COREG) 6.25 MG tablet Take 1 tablet (6.25 mg total) by mouth 2 (two) times daily with a meal. 12/29/20 03/29/21  Kayleen Memos, DO  guaiFENesin (MUCINEX) 600 MG 12 hr tablet Take 1 tablet (600 mg total) by mouth 2 (two) times daily. 01/26/21   Swayze, Ava, DO  isosorbide-hydrALAZINE (BIDIL) 20-37.5 MG tablet Take 2 tablets by mouth 3 (three) times daily. 01/26/21   Swayze, Ava, DO  nicotine (NICODERM CQ - DOSED IN MG/24 HOURS) 14 mg/24hr patch Place 1 patch (14 mg total) onto the skin daily. Patient not taking: Reported on 01/21/2021 12/29/20   Kayleen Memos, DO  nitroGLYCERIN (NITROSTAT) 0.4 MG SL tablet Place 1 tablet (0.4 mg total) under  the tongue every 5 (five) minutes as needed for chest pain. 01/26/21   Swayze, Ava, DO  Nutritional Supplements (FEEDING SUPPLEMENT, BOOST BREEZE,) LIQD Take 237 mLs by mouth 3 (three) times daily between meals. 01/26/21   Swayze, Ava, DO  pantoprazole (PROTONIX) 40 MG tablet Take 1 tablet (40 mg total) by mouth daily. 01/27/21   Swayze, Ava, DO  polyethylene glycol (MIRALAX / GLYCOLAX) 17 g packet Take 17 g by mouth daily. 01/27/21   Swayze, Ava, DO  potassium chloride SA (KLOR-CON) 20 MEQ tablet Take 1 tablet (20 mEq total) by mouth daily. 01/27/21   Swayze, Ava, DO  sacubitril-valsartan (ENTRESTO) 24-26 MG Take 1 tablet by mouth 2 (two) times daily. 12/29/20 03/29/21  Kayleen Memos, DO  senna-docusate (SENOKOT-S) 8.6-50 MG tablet Take 1 tablet by mouth 2 (two) times daily. 01/26/21   Swayze, Ava, DO  torsemide (DEMADEX) 20 MG tablet Take 1 tablet (20 mg total) by mouth daily. 01/27/21   Swayze, Ava, DO  traMADol (ULTRAM) 50 MG tablet Take 1 tablet (50 mg total) by mouth every 8 (eight) hours as needed for moderate pain. 01/26/21   Swayze, Ava, DO    Allergies    Lisinopril, Remeron [mirtazapine], and Abilify [aripiprazole]  Review of Systems   Review of Systems  Constitutional: Negative for fever.  HENT: Negative for ear pain and sore throat.   Eyes: Negative for visual disturbance.  Respiratory: Positive for cough and shortness of breath.   Cardiovascular: Positive for chest pain (currently resolved).  Gastrointestinal: Negative for abdominal pain, nausea and vomiting.  Genitourinary: Negative for dysuria and hematuria.  Musculoskeletal: Negative for back pain.  Skin: Negative for rash.  Neurological: Negative for headaches.  All other systems reviewed and are negative.   Physical Exam Updated Vital Signs BP (!) 151/108   Pulse (!) 102   Temp 98.5 F (36.9 C) (Oral)   Resp (!) 31   SpO2 100%   Physical Exam Vitals and nursing note reviewed.  Constitutional:      General: She is  not in acute distress.    Appearance: She is well-developed and well-nourished.  HENT:     Head: Normocephalic and atraumatic.  Eyes:     Conjunctiva/sclera: Conjunctivae normal.  Cardiovascular:     Rate and Rhythm: Normal rate and regular rhythm.     Heart sounds: Normal heart sounds. No murmur heard.   Pulmonary:     Effort: Tachypnea present. No respiratory distress.     Breath sounds: Normal breath sounds.  Abdominal:     Palpations: Abdomen is soft.     Tenderness: There is no abdominal tenderness.  Musculoskeletal:        General: No edema.     Cervical back: Neck supple.  Right lower leg: No edema.     Left lower leg: No edema.  Skin:    General: Skin is warm and dry.  Neurological:     Mental Status: She is alert.  Psychiatric:        Mood and Affect: Mood and affect normal.     ED Results / Procedures / Treatments   Labs (all labs ordered are listed, but only abnormal results are displayed) Labs Reviewed  BASIC METABOLIC PANEL - Abnormal; Notable for the following components:      Result Value   CO2 20 (*)    Glucose, Bld 116 (*)    BUN 26 (*)    Creatinine, Ser 1.15 (*)    All other components within normal limits  CBC - Abnormal; Notable for the following components:   WBC 1.6 (*)    RBC 3.72 (*)    Hemoglobin 10.0 (*)    HCT 33.5 (*)    MCHC 29.9 (*)    All other components within normal limits  BRAIN NATRIURETIC PEPTIDE - Abnormal; Notable for the following components:   B Natriuretic Peptide 2,830.5 (*)    All other components within normal limits  RAPID URINE DRUG SCREEN, HOSP PERFORMED - Abnormal; Notable for the following components:   Tetrahydrocannabinol POSITIVE (*)    All other components within normal limits  TROPONIN I (HIGH SENSITIVITY) - Abnormal; Notable for the following components:   Troponin I (High Sensitivity) 39 (*)    All other components within normal limits  RESP PANEL BY RT-PCR (FLU A&B, COVID) ARPGX2  I-STAT BETA HCG  BLOOD, ED (MC, WL, AP ONLY)  TROPONIN I (HIGH SENSITIVITY)    EKG EKG Interpretation  Date/Time:  Wednesday February 02 2021 12:39:31 EST Ventricular Rate:  108 PR Interval:  172 QRS Duration: 124 QT Interval:  356 QTC Calculation: 478 R Axis:   52 Text Interpretation: Sinus tachycardia Probable left atrial enlargement Left ventricular hypertrophy Anterior infarct, old Nonspecific T abnormalities, lateral leads Confirmed by Lacretia Leigh ([id number]) on 02/02/2021 2:11:37 PM   Radiology DG Chest 2 View  Result Date: 02/02/2021 CLINICAL DATA:  Short of breath EXAM: CHEST - 2 VIEW COMPARISON:  01/26/2021 FINDINGS: Cardiac enlargement without heart failure. No focal infiltrate or effusion. No mass lesion. IMPRESSION: Cardiac enlargement.  No acute abnormality. Electronically Signed   By: Franchot Gallo M.D.   On: 02/02/2021 12:43    Procedures Procedures   CRITICAL CARE Performed by: Rodney Booze   Total critical care time: 33 minutes  Critical care time was exclusive of separately billable procedures and treating other patients.  Critical care was necessary to treat or prevent imminent or life-threatening deterioration.  Critical care was time spent personally by me on the following activities: development of treatment plan with patient and/or surrogate as well as nursing, discussions with consultants, evaluation of patient's response to treatment, examination of patient, obtaining history from patient or surrogate, ordering and performing treatments and interventions, ordering and review of laboratory studies, ordering and review of radiographic studies, pulse oximetry and re-evaluation of patient's condition.   Medications Ordered in ED Medications  nicardipine (CARDENE) 20mg  in 0.86% saline 242ml IV infusion (0.1 mg/ml) (5 mg/hr Intravenous New Bag/Given 02/02/21 1325)  aspirin chewable tablet 324 mg (324 mg Oral Given 02/02/21 1243)    ED Course  I have reviewed the  triage vital signs and the nursing notes.  Pertinent labs & imaging results that were available during my care of the patient  were reviewed by me and considered in my medical decision making (see chart for details).    MDM Rules/Calculators/A&P                          45 y/o F presenting for eval of shortness of breath.  EKG - Sinus tachycardia Rightward axis Biventricular hypertrophy Anterior infarct , age undetermined ST & T wave abnormality, consider inferolateral ischemia Prolonged QT Abnormal ECG inferior ST/Ant elevation new from previous. ACS  - Code stemi initiated by Dr. Johnney Killian  Pt given asa and started on ntg drip.   Dr. Einar Gip evaluated the patient at bedside and cancelled the code STEMI. He switched patient to cardene drip to tx htn. Recommends admission to hospitalist for htn urgency/emergency.  Reviewed/interpreted labs CBC with leukopenia, anemia. This appears chronic BMP with slightly low bicarb, elevated BUN/creatinine which appears chronic Troponin is marginally elevated, likely due to demand BNP significantly elevated Beta-hCG negative Covid pending at the time of admission  Chest x-ray reviewed/interpreted - Cardiac enlargement.  No acute abnormality.  Patient with an acute hypertensive crisis. Started on antihypertensive drip here in the emergency department. Will admit to hospitalist service for further treatment.  2:30 PM CONSULT With Dr. Roosevelt Locks who accepts patient for admission.  Final Clinical Impression(s) / ED Diagnoses Final diagnoses:  Hypertensive crisis    Rx / DC Orders ED Discharge Orders    None       Rodney Booze, PA-C 02/02/21 1436

## 2021-02-02 NOTE — ED Notes (Signed)
PT will not take any blood pressure medication bc she states " She is still at child bearing age and she will not be a [place] pig" MD aware.

## 2021-02-02 NOTE — ED Notes (Signed)
Pt refusing medication to lower bp meds, previous nurse informed that md is aware.

## 2021-02-03 ENCOUNTER — Observation Stay (HOSPITAL_COMMUNITY): Payer: Medicaid - Out of State

## 2021-02-03 DIAGNOSIS — I160 Hypertensive urgency: Secondary | ICD-10-CM | POA: Diagnosis not present

## 2021-02-03 DIAGNOSIS — I5041 Acute combined systolic (congestive) and diastolic (congestive) heart failure: Secondary | ICD-10-CM | POA: Diagnosis not present

## 2021-02-03 LAB — BASIC METABOLIC PANEL
Anion gap: 11 (ref 5–15)
BUN: 29 mg/dL — ABNORMAL HIGH (ref 6–20)
CO2: 21 mmol/L — ABNORMAL LOW (ref 22–32)
Calcium: 8.7 mg/dL — ABNORMAL LOW (ref 8.9–10.3)
Chloride: 104 mmol/L (ref 98–111)
Creatinine, Ser: 1.42 mg/dL — ABNORMAL HIGH (ref 0.44–1.00)
GFR, Estimated: 47 mL/min — ABNORMAL LOW (ref >60)
Glucose, Bld: 124 mg/dL — ABNORMAL HIGH (ref 70–99)
Potassium: 3.6 mmol/L (ref 3.5–5.1)
Sodium: 136 mmol/L (ref 135–145)

## 2021-02-03 MED ORDER — DIPHENHYDRAMINE HCL 25 MG PO CAPS
50.0000 mg | ORAL_CAPSULE | Freq: Once | ORAL | Status: AC
  Administered 2021-02-03: 50 mg via ORAL
  Filled 2021-02-03: qty 2

## 2021-02-03 NOTE — ED Notes (Signed)
SDU Breakfast order placed 

## 2021-02-03 NOTE — ED Notes (Signed)
Patient is resting comfortably at this time. No needs identified.

## 2021-02-03 NOTE — ED Notes (Signed)
Patient is refusing to take any scheduled medications for today.

## 2021-02-03 NOTE — ED Notes (Signed)
Pt wants to leave AMA; provider spoke with pt.

## 2021-02-03 NOTE — Progress Notes (Signed)
Progress Note  Patient Name: Jane Estrada Date of Encounter: 02/03/2021  [facility] Cardiologist: Jenkins Rouge, MD   Subjective   Given IV Lasix 40x1 yesterday.  800 cc UOP recorded.  Creatinine worsened from 1.2 to 1.4.  She refused all oral medications.  BP 143/126, pulse 98 today.  Inpatient Medications    Scheduled Meds: . aspirin EC  81 mg Oral Daily  . atorvastatin  40 mg Oral Daily  . atovaquone  1,500 mg Oral Q breakfast  . carvedilol  6.25 mg Oral BID WC  . enoxaparin (LOVENOX) injection  30 mg Subcutaneous Q24H  . guaiFENesin  600 mg Oral BID  . isosorbide-hydrALAZINE  2 tablet Oral TID  . lidocaine  1 patch Transdermal Q24H  . nicotine  14 mg Transdermal Daily  . pantoprazole  40 mg Oral Daily  . polyethylene glycol  17 g Oral Daily  . potassium chloride SA  20 mEq Oral Daily  . sacubitril-valsartan  1 tablet Oral BID  . senna-docusate  1 tablet Oral BID  . sodium chloride flush  3 mL Intravenous Q12H   Continuous Infusions: . sodium chloride     PRN Meds: sodium chloride, acetaminophen, albuterol, hydrALAZINE, ondansetron (ZOFRAN) IV, sodium chloride flush   Vital Signs    Vitals:   02/03/21 0745 02/03/21 0800 02/03/21 0815 02/03/21 0830  BP: (!) 147/116 (!) 148/114 (!) 148/117 (!) 143/126  Pulse: 97 94 95 98  Resp: (!) 28 15 18  (!) 0  Temp:      TempSrc:      SpO2: 94% 100% 100% 100%  Weight:      Height:        Intake/Output Summary (Last 24 hours) at 02/03/2021 0911 Last data filed at 02/02/2021 1820 Gross per 24 hour  Intake 97.29 ml  Output 800 ml  Net -702.71 ml   Last 3 Weights 02/02/2021 01/27/2021 01/22/2021  Weight (lbs) 106 lb 106 lb 8 oz 112 lb 7 oz  Weight (kg) 48.081 kg 48.308 kg 51 kg      Telemetry    Sinus rhythm/sinus tachycardia - Personally Reviewed  ECG    No new ECG - Personally Reviewed  Physical Exam   GEN: No acute distress.   Neck: No JVD Cardiac: RRR, no murmurs Respiratory: Clear to auscultation  bilaterally. GI: Soft, nontender MS: No edema Neuro:  Nonfocal  Psych: Normal affect   Labs    High Sensitivity Troponin:   Recent Labs  Lab 01/21/21 1047 01/21/21 1251 02/02/21 1144 02/02/21 2157  TROPONINIHS 63* 64* 39* 49*      Chemistry Recent Labs  Lab 02/02/21 1144 02/03/21 0442  NA 139 136  K 4.0 3.6  CL 105 104  CO2 20* 21*  GLUCOSE 116* 124*  BUN 26* 29*  CREATININE 1.15* 1.42*  CALCIUM 9.7 8.7*  GFRNONAA >60 47*  ANIONGAP 14 11     Hematology Recent Labs  Lab 02/02/21 1144  WBC 1.6*  RBC 3.72*  HGB 10.0*  HCT 33.5*  MCV 90.1  MCH 26.9  MCHC 29.9*  RDW 15.4  PLT 156    BNP Recent Labs  Lab 02/02/21 1231  BNP 2,830.5*     DDimer No results for input(s): DDIMER in the last 168 hours.   Radiology    DG Chest 1 View  Result Date: 02/03/2021 CLINICAL DATA:  45 year old female with shortness of breath. EXAM: CHEST  1 VIEW COMPARISON:  Chest radiographs 02/02/2021 and earlier. FINDINGS: Portable AP semi  upright view at 0443 hours. Pronounced cardiomegaly. Stable cardiac size and mediastinal contours. Lung volumes remain normal. Visualized tracheal air column is within normal limits. Allowing for portable technique the lungs are clear. No pulmonary edema. Negative visible osseous structures. IMPRESSION: Pronounced cardiomegaly but no acute cardiopulmonary abnormality. Electronically Signed   By: Genevie Ann M.D.   On: 02/03/2021 05:00   DG Chest 2 View  Result Date: 02/02/2021 CLINICAL DATA:  Short of breath EXAM: CHEST - 2 VIEW COMPARISON:  01/26/2021 FINDINGS: Cardiac enlargement without heart failure. No focal infiltrate or effusion. No mass lesion. IMPRESSION: Cardiac enlargement.  No acute abnormality. Electronically Signed   By: Franchot Gallo M.D.   On: 02/02/2021 12:43    Cardiac Studies     Patient Profile     45 y.o. female with a hx of MIsecondary to cocaine use,NICM w/ no CAD at cath in [place], [place] EF40% in 2018>> 20% in  Jan 2022, bipolar d/o with multiple suicide attempts, tobacco use, polysubstance abuse(mainly cocaine and THC), HIVnot on HAART, granular cell tumor of the trachea,non compliance and homelessness who is being seen today for the evaluation CHF of at the request of Dr. Roosevelt Locks.   Assessment & Plan    Acute on chronic combined CHF: In setting of medication noncompliance and hypertensive urgency.  Multiple ER evaluation and to admission last month for CHF exacerbation.  She declined cardiac medication but was taking diuretics during admission. -BNP elevated at 2830.  Chest x-ray clear.  Appears euvolemic by exam and renal function worsening with diuresis.  Will discontinue IV Lasix -She is started on torsemide 20 mg daily, Entresto 24/26 mg twice daily, BiDil 20/37.5 mg 3 times daily, Coreg 6.25 mg twice daily, but she has refused all medications -Complicated situation as she refuses all her medications.  Palliative care consult would be appropriate  Hypertensive urgency -started on nitroglycerin drip with improvement in BP yesterday but unable to transition to p.o. meds as she is refusing meds as above  Tobacco and marijuana smoking -Recommended cessation  For questions or updates, please contact [place] Please consult [email or web address] for contact info under        Signed, Donato Heinz, MD  02/03/2021, 9:11 AM

## 2021-02-03 NOTE — ED Notes (Signed)
Pt is leaving AMA. Pt spoke with provider about concerns and was informed of risks of leaving. Patient still wants to leave AMA. Attempted to receive e-signature in room. Signature pad malfunctioning. Received verbal acknowledgement of desire to leave AMA despite education received.  -S.W. 02/03/2021 @ 1030

## 2021-02-03 NOTE — Discharge Summary (Signed)
Patient decided to leave AMA.  She  understands the risks of not taking her medications but at the same time thinks all her medications are causing her "side effects".  She was not able to articulate what kind except to say she has read all of her package inserts.   Eulogio Bear DO

## 2021-02-03 NOTE — ED Notes (Signed)
Pt still refusing blood pressure medications

## 2021-02-03 NOTE — ED Notes (Addendum)
Pt requesting something for sleep, md Chotiner put in verbal for benadryl 50 mg po.

## 2021-02-03 NOTE — ED Notes (Signed)
Informed pt if she wants to take the medications she previously declined to let me know. Pt agreed and stated she still does not want to take any of the medications she previously declined. Pt remains hypertensive and was informed of this, pt still declines anti hypertensive medication

## 2021-02-20 ENCOUNTER — Other Ambulatory Visit: Payer: Self-pay

## 2021-02-20 ENCOUNTER — Encounter (HOSPITAL_COMMUNITY): Payer: Self-pay | Admitting: *Deleted

## 2021-02-20 ENCOUNTER — Emergency Department (HOSPITAL_COMMUNITY)
Admission: EM | Admit: 2021-02-20 | Discharge: 2021-02-20 | Disposition: A | Discharge status: Home / Self Care | Payer: Medicaid - Out of State | Attending: Emergency Medicine | Admitting: Emergency Medicine

## 2021-02-20 ENCOUNTER — Emergency Department (HOSPITAL_COMMUNITY): Payer: Medicaid - Out of State

## 2021-02-20 DIAGNOSIS — I110 Hypertensive heart disease with heart failure: Secondary | ICD-10-CM | POA: Diagnosis not present

## 2021-02-20 DIAGNOSIS — Z7982 Long term (current) use of aspirin: Secondary | ICD-10-CM | POA: Diagnosis not present

## 2021-02-20 DIAGNOSIS — I509 Heart failure, unspecified: Secondary | ICD-10-CM

## 2021-02-20 DIAGNOSIS — R000 Tachycardia, unspecified: Secondary | ICD-10-CM | POA: Insufficient documentation

## 2021-02-20 DIAGNOSIS — R0602 Shortness of breath: Secondary | ICD-10-CM | POA: Diagnosis present

## 2021-02-20 DIAGNOSIS — Z21 Asymptomatic human immunodeficiency virus [HIV] infection status: Secondary | ICD-10-CM | POA: Insufficient documentation

## 2021-02-20 DIAGNOSIS — Z79899 Other long term (current) drug therapy: Secondary | ICD-10-CM | POA: Insufficient documentation

## 2021-02-20 DIAGNOSIS — F17210 Nicotine dependence, cigarettes, uncomplicated: Secondary | ICD-10-CM | POA: Diagnosis not present

## 2021-02-20 DIAGNOSIS — I5022 Chronic systolic (congestive) heart failure: Secondary | ICD-10-CM | POA: Insufficient documentation

## 2021-02-20 DIAGNOSIS — D72819 Decreased white blood cell count, unspecified: Secondary | ICD-10-CM | POA: Diagnosis not present

## 2021-02-20 LAB — I-STAT BETA HCG BLOOD, ED (MC, WL, AP ONLY): I-stat hCG, quantitative: <5 m[IU]/mL (ref <5)

## 2021-02-20 LAB — CBC WITH DIFFERENTIAL/PLATELET
Abs Immature Granulocytes: 0.02 10*3/uL (ref 0.00–0.07)
Basophils Absolute: 0 10*3/uL (ref 0.0–0.1)
Basophils Relative: 1 %
Eosinophils Absolute: 0 10*3/uL (ref 0.0–0.5)
Eosinophils Relative: 0 %
HCT: 34.4 % — ABNORMAL LOW (ref 36.0–46.0)
Hemoglobin: 10 g/dL — ABNORMAL LOW (ref 12.0–15.0)
Immature Granulocytes: 1 %
Lymphocytes Relative: 12 %
Lymphs Abs: 0.2 10*3/uL — ABNORMAL LOW (ref 0.7–4.0)
MCH: 26.2 pg (ref 26.0–34.0)
MCHC: 29.1 g/dL — ABNORMAL LOW (ref 30.0–36.0)
MCV: 90.1 fL (ref 80.0–100.0)
Monocytes Absolute: 0.2 10*3/uL (ref 0.1–1.0)
Monocytes Relative: 12 %
Neutro Abs: 1.6 10*3/uL — ABNORMAL LOW (ref 1.7–7.7)
Neutrophils Relative %: 74 %
Platelets: 153 10*3/uL (ref 150–400)
RBC: 3.82 MIL/uL — ABNORMAL LOW (ref 3.87–5.11)
RDW: 16 % — ABNORMAL HIGH (ref 11.5–15.5)
WBC: 2.1 10*3/uL — ABNORMAL LOW (ref 4.0–10.5)
nRBC: 0 % (ref 0.0–0.2)

## 2021-02-20 LAB — COMPREHENSIVE METABOLIC PANEL
ALT: 21 U/L (ref 0–44)
AST: 56 U/L — ABNORMAL HIGH (ref 15–41)
Albumin: 4 g/dL (ref 3.5–5.0)
Alkaline Phosphatase: 45 U/L (ref 38–126)
Anion gap: 14 (ref 5–15)
BUN: 33 mg/dL — ABNORMAL HIGH (ref 6–20)
CO2: 27 mmol/L (ref 22–32)
Calcium: 10.1 mg/dL (ref 8.9–10.3)
Chloride: 93 mmol/L — ABNORMAL LOW (ref 98–111)
Creatinine, Ser: 1.42 mg/dL — ABNORMAL HIGH (ref 0.44–1.00)
GFR, Estimated: 47 mL/min — ABNORMAL LOW (ref >60)
Glucose, Bld: 86 mg/dL (ref 70–99)
Potassium: 4 mmol/L (ref 3.5–5.1)
Sodium: 134 mmol/L — ABNORMAL LOW (ref 135–145)
Total Bilirubin: 1.6 mg/dL — ABNORMAL HIGH (ref 0.3–1.2)
Total Protein: 8.2 g/dL — ABNORMAL HIGH (ref 6.5–8.1)

## 2021-02-20 LAB — URINALYSIS, ROUTINE W REFLEX MICROSCOPIC
Bacteria, UA: NONE SEEN
Bilirubin Urine: NEGATIVE
Glucose, UA: NEGATIVE mg/dL
Hgb urine dipstick: NEGATIVE
Ketones, ur: NEGATIVE mg/dL
Leukocytes,Ua: NEGATIVE
Nitrite: NEGATIVE
Protein, ur: 30 mg/dL — AB
Specific Gravity, Urine: 1.011 (ref 1.005–1.030)
pH: 6 (ref 5.0–8.0)

## 2021-02-20 LAB — TROPONIN I (HIGH SENSITIVITY)
Troponin I (High Sensitivity): 50 ng/L — ABNORMAL HIGH (ref <18)
Troponin I (High Sensitivity): 47 ng/L — ABNORMAL HIGH (ref <18)

## 2021-02-20 LAB — BRAIN NATRIURETIC PEPTIDE: B Natriuretic Peptide: 1707.4 pg/mL — ABNORMAL HIGH (ref 0.0–100.0)

## 2021-02-20 MED ORDER — FUROSEMIDE 10 MG/ML IJ SOLN
40.0000 mg | Freq: Once | INTRAMUSCULAR | Status: AC
  Administered 2021-02-20: 40 mg via INTRAVENOUS
  Filled 2021-02-20: qty 4

## 2021-02-20 MED ORDER — ISOSORB DINITRATE-HYDRALAZINE 20-37.5 MG PO TABS
2.0000 | ORAL_TABLET | Freq: Once | ORAL | Status: DC
  Filled 2021-02-20: qty 2

## 2021-02-20 MED ORDER — CARVEDILOL 3.125 MG PO TABS
6.2500 mg | ORAL_TABLET | Freq: Once | ORAL | Status: AC
  Administered 2021-02-20: 6.25 mg via ORAL
  Filled 2021-02-20: qty 2
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
## 2021-02-20 NOTE — ED Triage Notes (Signed)
The pt [facility] by [facility]  [facility] the [facility] c/o difficulty breathing for four months  She is not compliant with her meds  She was seen here recentlt and she was given a r that she reports that she did not think was helping

## 2021-02-20 NOTE — Discharge Instructions (Signed)
Please follow up with your PCP regarding your ED visit today A referral to the cardiologist has been placed for you. They will call you to schedule an appointment Continue taking all of your medications as prescribed (including blood pressure medication).  Return to the ED for any worsening symptoms

## 2021-03-04 ENCOUNTER — Encounter: Payer: Self-pay | Admitting: General Practice

## 2021-04-09 IMAGING — CR DG CHEST 2V
2 series · 2 of 2 positions shown · non-contrast
Comparison: 12/06/2020

CLINICAL DATA: Shortness of breath and chest pain

EXAM:
CHEST - 2 VIEW

[chest pa]
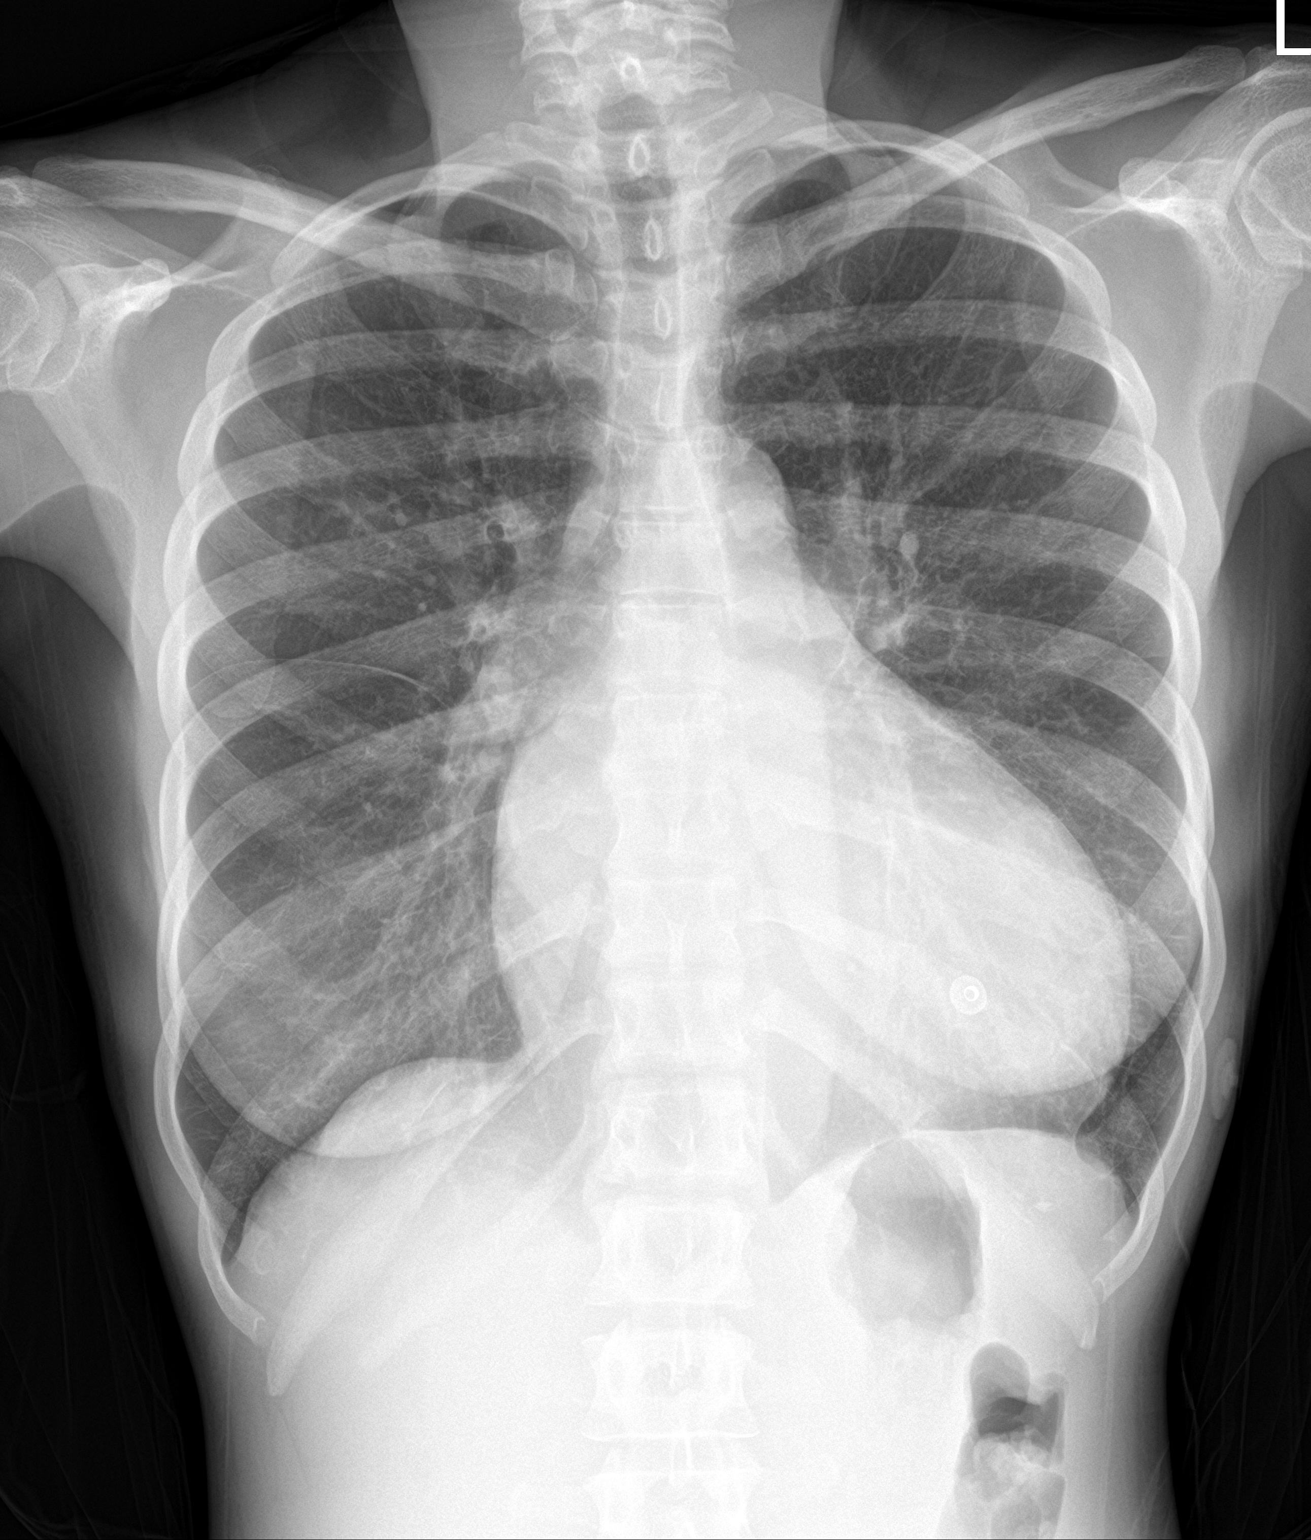

[chest lat]
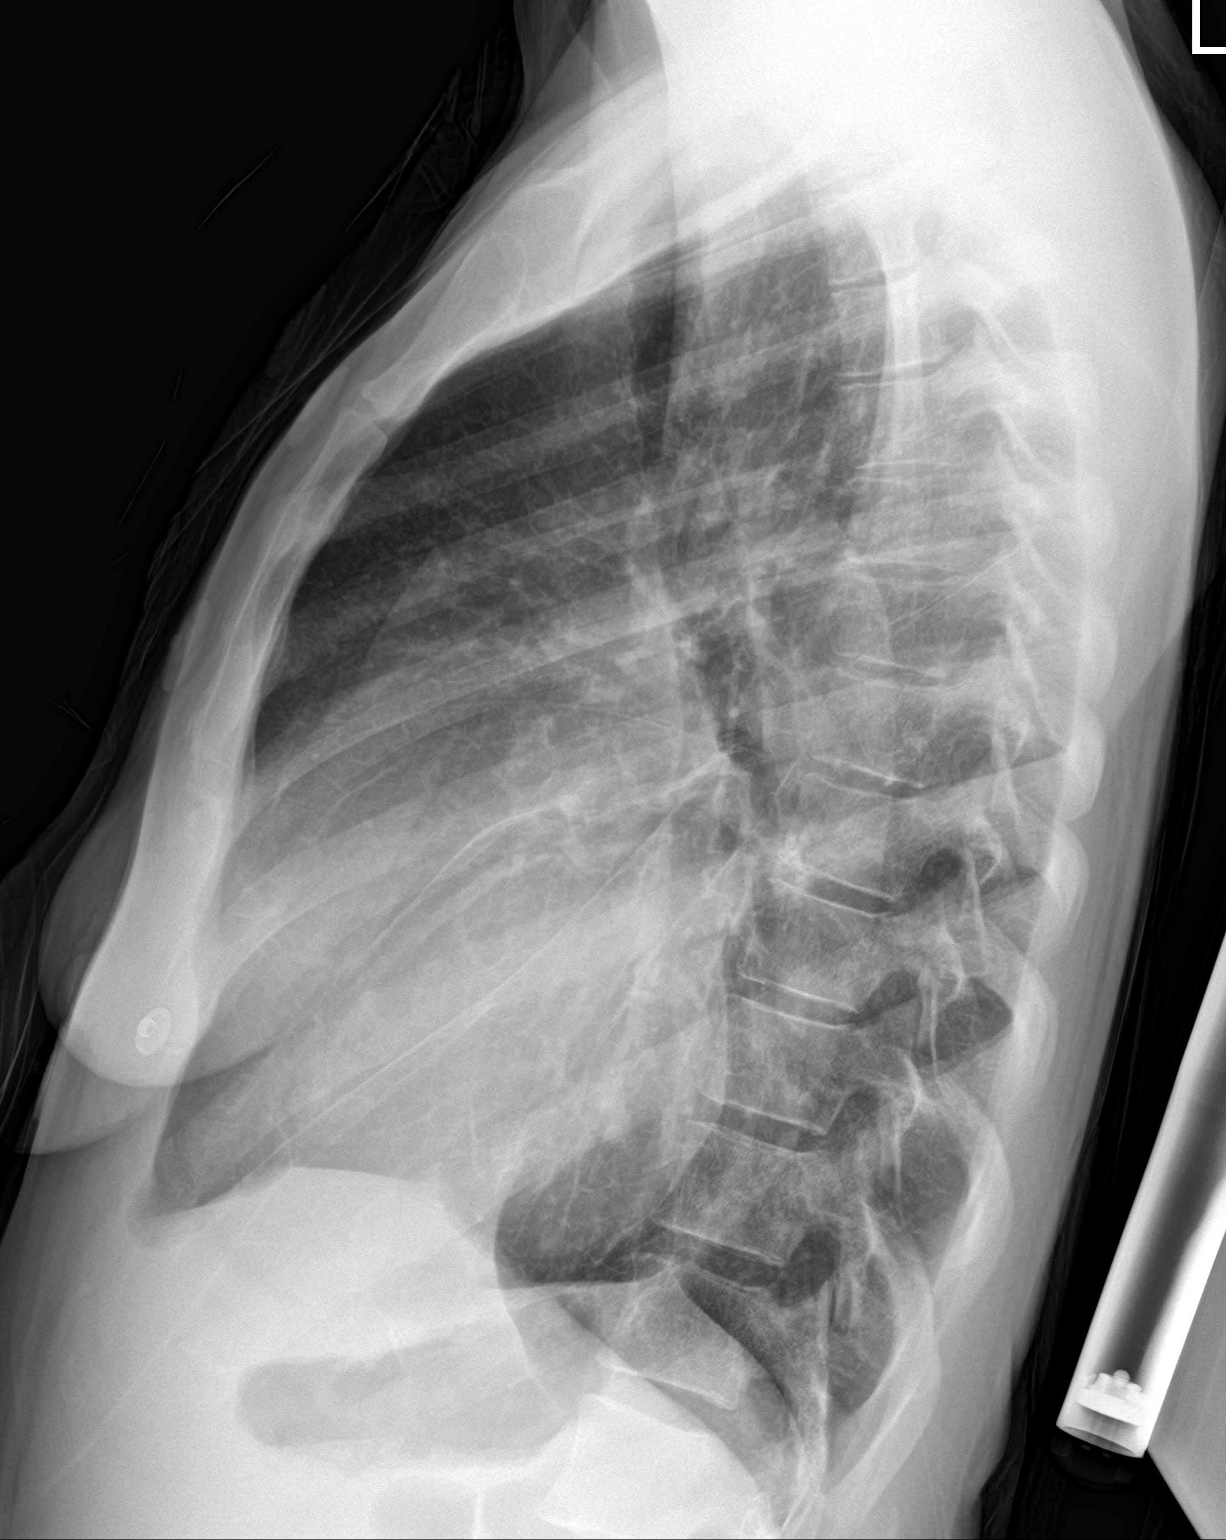

[2 of 2 positions shown; findings below may reference images not displayed]

FINDINGS: Moderate cardiomegaly. No focal airspace consolidation. Mild linear
opacity at the right lung base. No pleural effusion or pneumothorax.
IMPRESSION: Cardiomegaly without pulmonary edema. Faint opacity at the right
lung base, likely atelectasis.

## 2021-05-22 IMAGING — CR DG CHEST 2V
2 series · 2 of 2 positions shown · non-contrast
Comparison: 01/26/2021

CLINICAL DATA: Short of breath

EXAM:
CHEST - 2 VIEW

[chest lat]
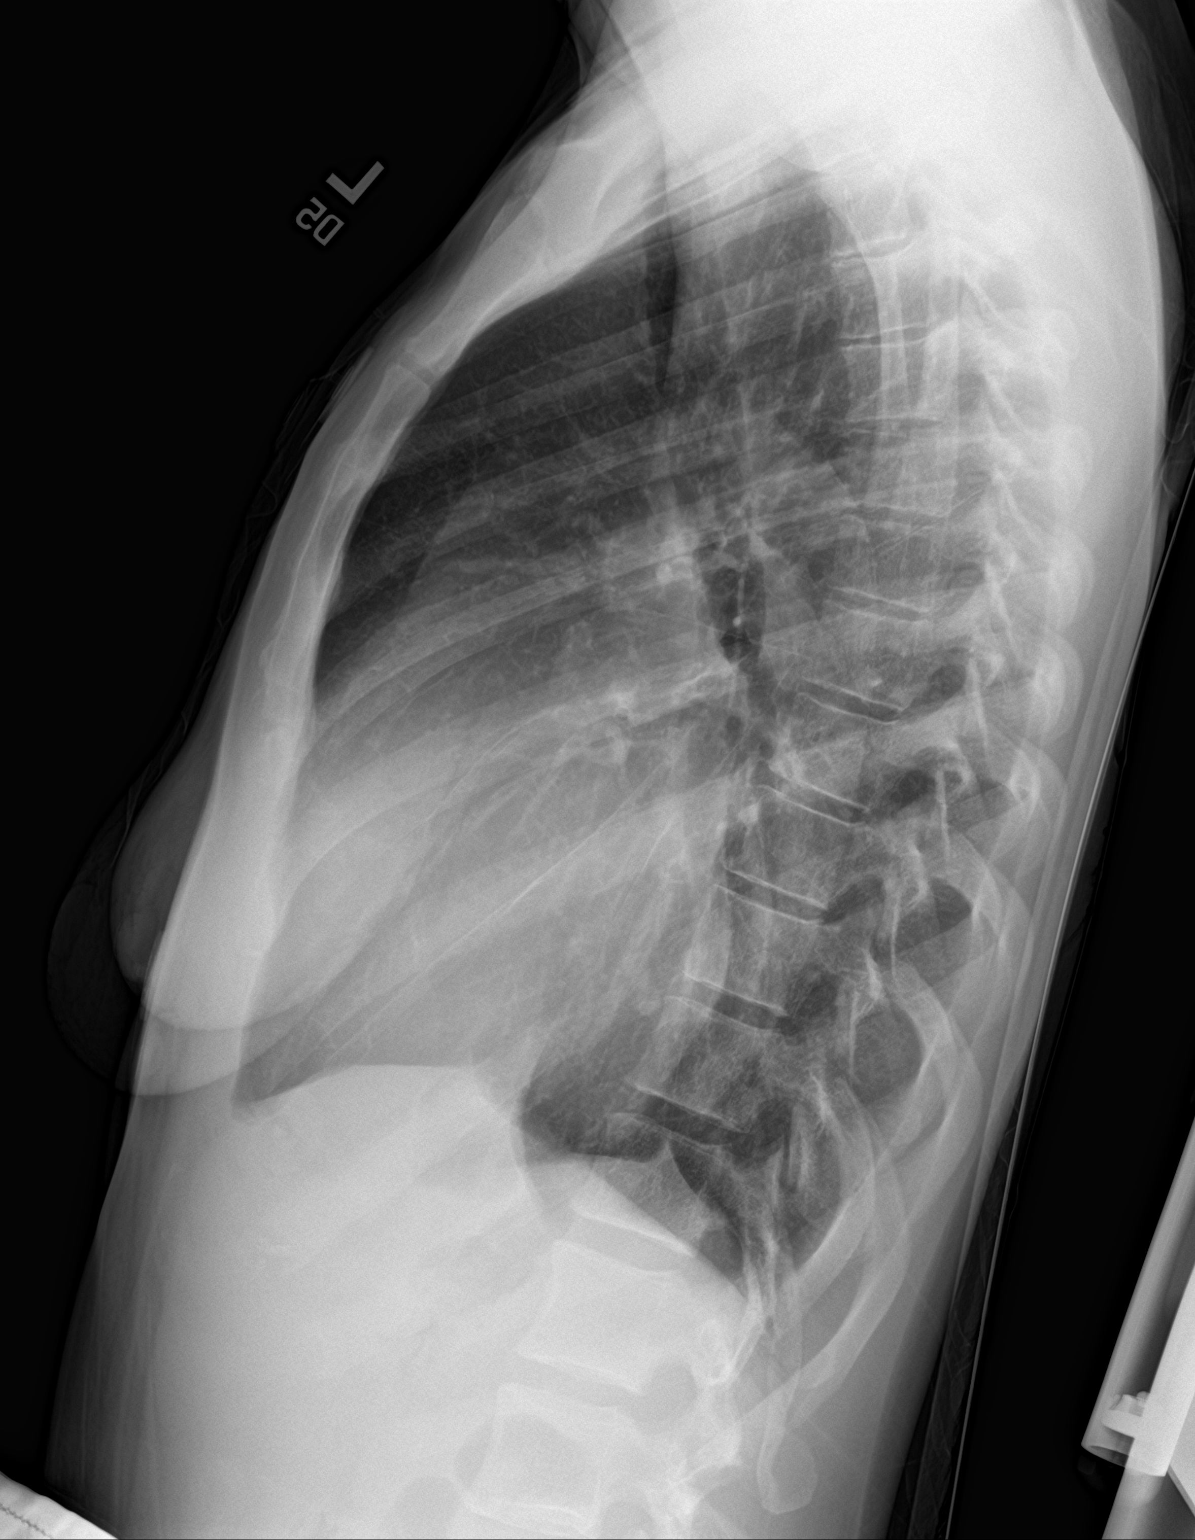

[chest ap]
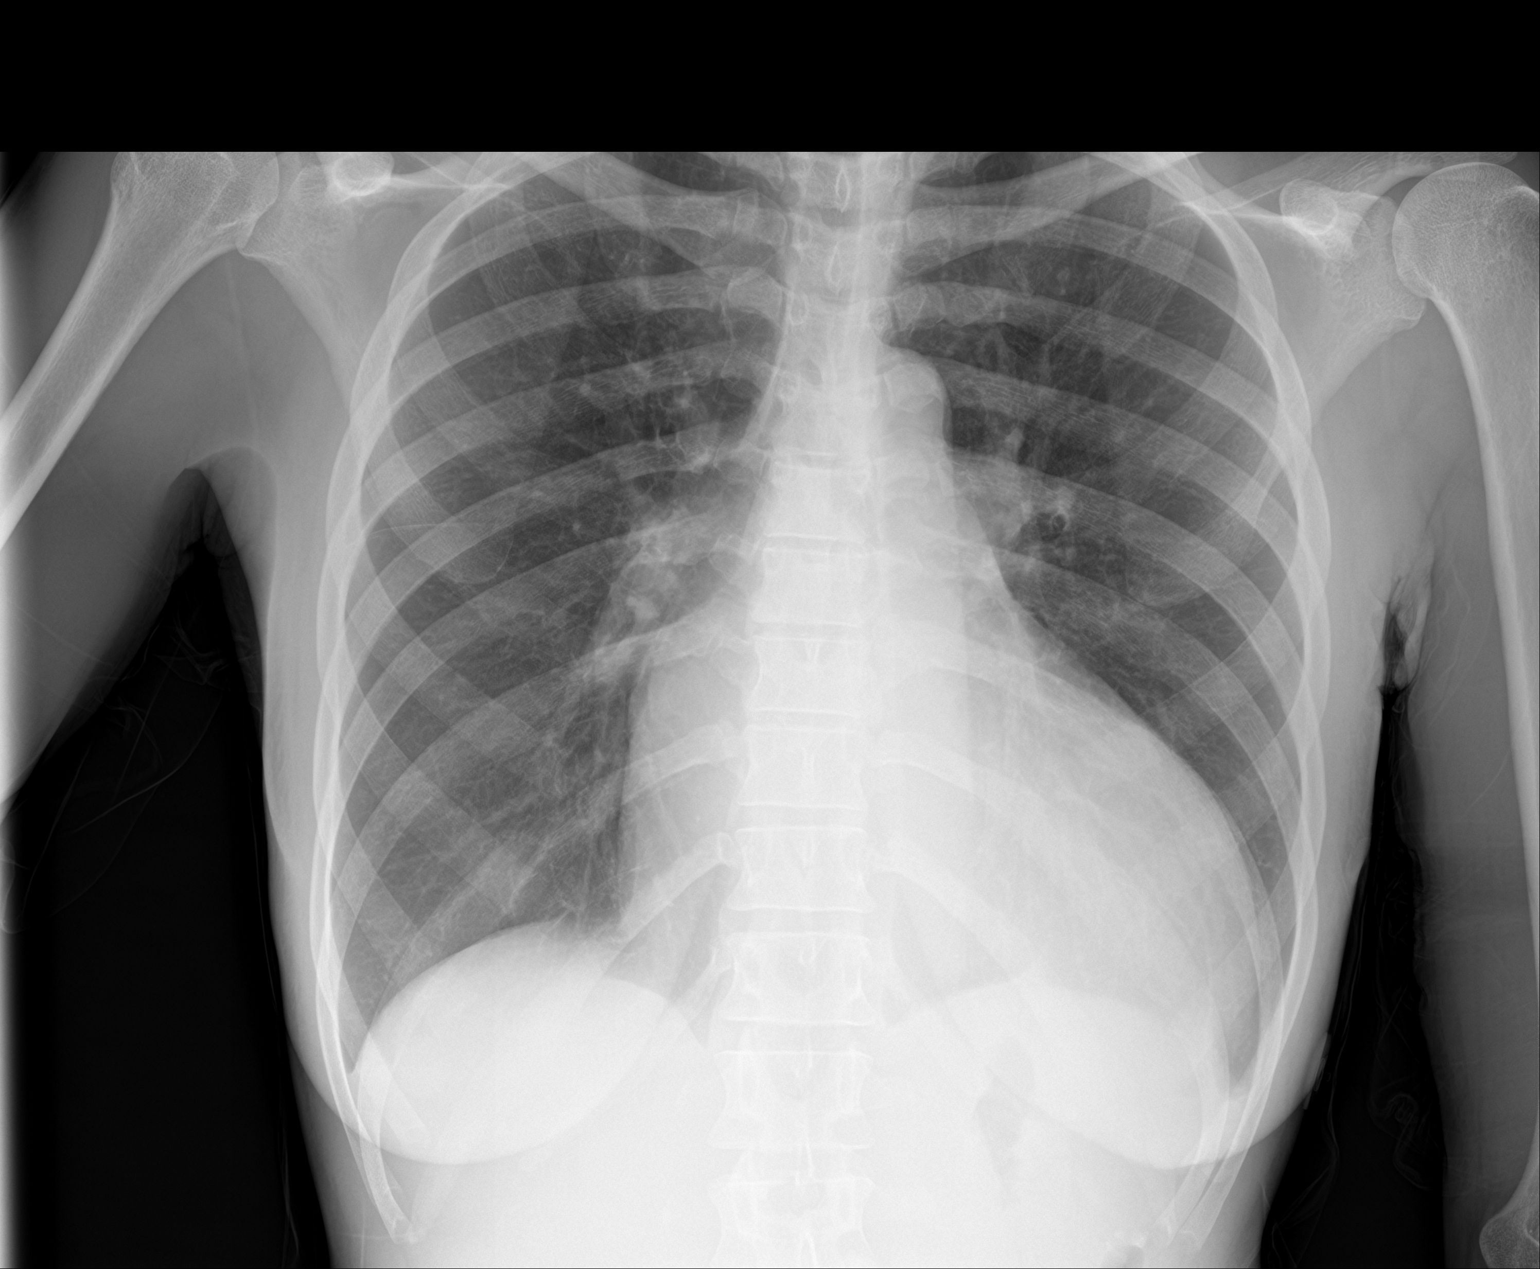

[2 of 2 positions shown; findings below may reference images not displayed]

FINDINGS: Cardiac enlargement without heart failure. No focal infiltrate or
effusion. No mass lesion.
IMPRESSION: Cardiac enlargement.  No acute abnormality.

## 2021-06-01 ENCOUNTER — Other Ambulatory Visit: Payer: Self-pay

## 2021-06-09 IMAGING — DX DG CHEST 1V PORT
1 series · 1 of 1 positions shown · non-contrast
Comparison: February 16, 2021

CLINICAL DATA: Difficulty breathing.

EXAM:
PORTABLE CHEST 1 VIEW

[chest ap]
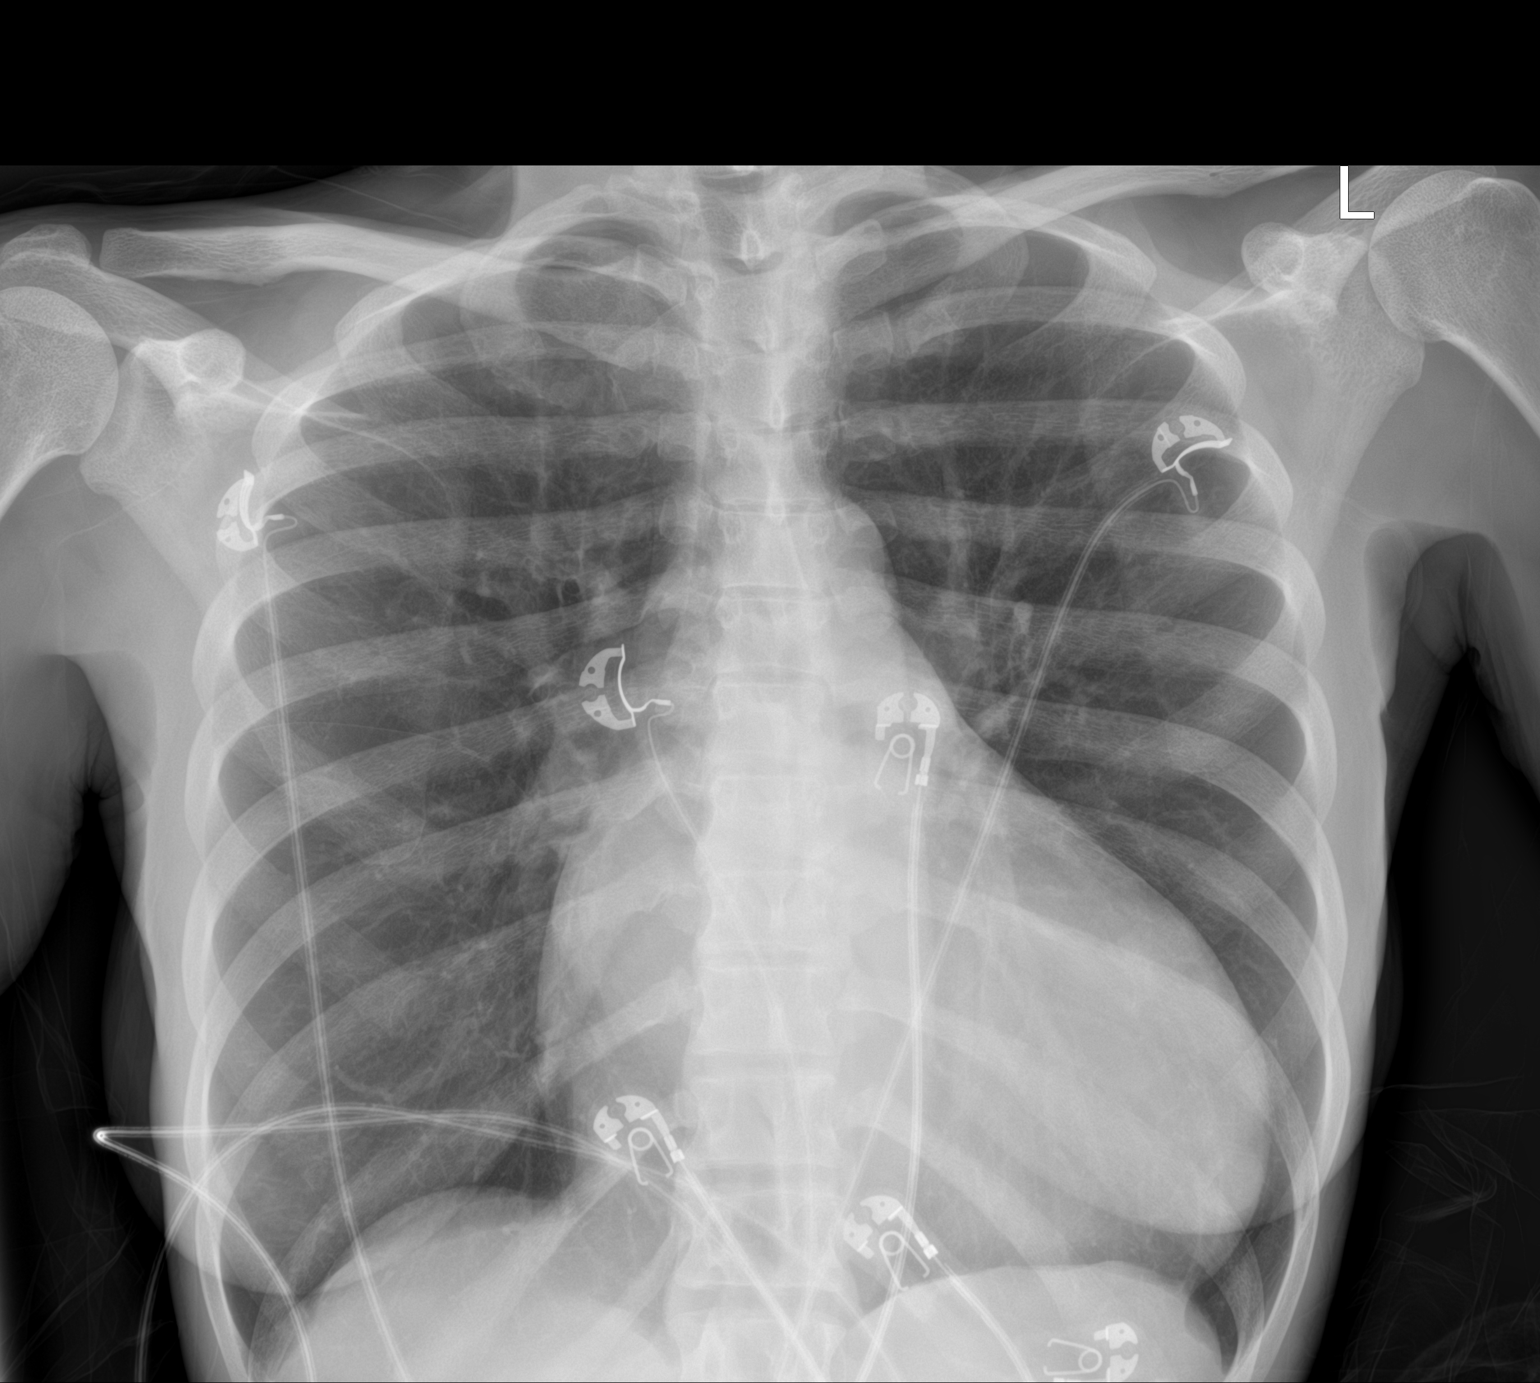

[1 of 1 positions shown; findings below may reference images not displayed]

FINDINGS: The cardiac silhouette is markedly enlarged. Both lungs are clear.
The visualized skeletal structures are unremarkable.
IMPRESSION: Marked cardiomegaly without acute or active cardiopulmonary disease.
